# Patient Record
Sex: Male | Born: 1985
Health system: Southern US, Community
[De-identification: ages and names within clinical notes are randomized; demographics above are authoritative.]

## PROBLEM LIST (undated history)

## (undated) ENCOUNTER — Emergency Department (HOSPITAL_COMMUNITY)

## (undated) DIAGNOSIS — K219 Gastro-esophageal reflux disease without esophagitis: Secondary | ICD-10-CM

## (undated) DIAGNOSIS — F32A Depression, unspecified: Secondary | ICD-10-CM

## (undated) DIAGNOSIS — H6980 Other specified disorders of Eustachian tube, unspecified ear: Secondary | ICD-10-CM

## (undated) DIAGNOSIS — K439 Ventral hernia without obstruction or gangrene: Secondary | ICD-10-CM

## (undated) DIAGNOSIS — F909 Attention-deficit hyperactivity disorder, unspecified type: Secondary | ICD-10-CM

## (undated) DIAGNOSIS — M545 Low back pain, unspecified: Secondary | ICD-10-CM

## (undated) DIAGNOSIS — K254 Chronic or unspecified gastric ulcer with hemorrhage: Secondary | ICD-10-CM

## (undated) DIAGNOSIS — G4733 Obstructive sleep apnea (adult) (pediatric): Secondary | ICD-10-CM

## (undated) DIAGNOSIS — K589 Irritable bowel syndrome without diarrhea: Secondary | ICD-10-CM

## (undated) DIAGNOSIS — I10 Essential (primary) hypertension: Secondary | ICD-10-CM

## (undated) DIAGNOSIS — F329 Major depressive disorder, single episode, unspecified: Secondary | ICD-10-CM

## (undated) DIAGNOSIS — J42 Unspecified chronic bronchitis: Secondary | ICD-10-CM

## (undated) DIAGNOSIS — F319 Bipolar disorder, unspecified: Secondary | ICD-10-CM

## (undated) DIAGNOSIS — F419 Anxiety disorder, unspecified: Secondary | ICD-10-CM

## (undated) DIAGNOSIS — Z87442 Personal history of urinary calculi: Secondary | ICD-10-CM

## (undated) DIAGNOSIS — K760 Fatty (change of) liver, not elsewhere classified: Secondary | ICD-10-CM

## (undated) DIAGNOSIS — G8929 Other chronic pain: Secondary | ICD-10-CM

## (undated) DIAGNOSIS — G43909 Migraine, unspecified, not intractable, without status migrainosus: Secondary | ICD-10-CM

## (undated) HISTORY — DX: Bipolar disorder, unspecified: F31.9

## (undated) HISTORY — DX: Ventral hernia without obstruction or gangrene: K43.9

## (undated) HISTORY — DX: Gastro-esophageal reflux disease without esophagitis: K21.9

## (undated) HISTORY — DX: Irritable bowel syndrome, unspecified: K58.9

## (undated) HISTORY — PX: CYSTOSCOPY W/ URETEROSCOPY W/ LITHOTRIPSY: SUR380

## (undated) HISTORY — PX: FRACTURE SURGERY: SHX138

## (undated) HISTORY — PX: TYMPANOSTOMY TUBE PLACEMENT: SHX32

## (undated) HISTORY — DX: Attention-deficit hyperactivity disorder, unspecified type: F90.9

---
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
## 2006-02-26 ENCOUNTER — Emergency Department (HOSPITAL_COMMUNITY): Admission: EM | Admit: 2006-02-26 | Discharge: 2006-02-26 | Payer: Self-pay | Admitting: Family Medicine

## 2006-05-01 ENCOUNTER — Emergency Department (HOSPITAL_COMMUNITY): Admission: EM | Admit: 2006-05-01 | Discharge: 2006-05-01 | Payer: Self-pay | Admitting: Family Medicine

## 2006-07-30 ENCOUNTER — Emergency Department (HOSPITAL_COMMUNITY): Admission: EM | Admit: 2006-07-30 | Discharge: 2006-07-30 | Payer: Self-pay | Admitting: Emergency Medicine

## 2006-08-14 ENCOUNTER — Emergency Department (HOSPITAL_COMMUNITY): Admission: EM | Admit: 2006-08-14 | Discharge: 2006-08-14 | Payer: Self-pay | Admitting: Emergency Medicine

## 2006-09-17 ENCOUNTER — Emergency Department (HOSPITAL_COMMUNITY): Admission: EM | Admit: 2006-09-17 | Discharge: 2006-09-18 | Payer: Self-pay | Admitting: Emergency Medicine

## 2006-10-12 ENCOUNTER — Emergency Department (HOSPITAL_COMMUNITY): Admission: EM | Admit: 2006-10-12 | Discharge: 2006-10-12 | Payer: Self-pay | Admitting: Emergency Medicine

## 2006-11-25 ENCOUNTER — Emergency Department (HOSPITAL_COMMUNITY): Admission: EM | Admit: 2006-11-25 | Discharge: 2006-11-25 | Payer: Self-pay | Admitting: Family Medicine

## 2006-12-13 ENCOUNTER — Emergency Department (HOSPITAL_COMMUNITY): Admission: EM | Admit: 2006-12-13 | Discharge: 2006-12-13 | Payer: Self-pay | Admitting: Family Medicine

## 2006-12-15 ENCOUNTER — Emergency Department (HOSPITAL_COMMUNITY): Admission: EM | Admit: 2006-12-15 | Discharge: 2006-12-15 | Payer: Self-pay | Admitting: Family Medicine

## 2007-05-09 ENCOUNTER — Emergency Department (HOSPITAL_COMMUNITY): Admission: EM | Admit: 2007-05-09 | Discharge: 2007-05-10 | Payer: Self-pay | Admitting: Emergency Medicine

## 2007-06-01 ENCOUNTER — Emergency Department (HOSPITAL_COMMUNITY): Admission: EM | Admit: 2007-06-01 | Discharge: 2007-06-01 | Payer: Self-pay | Admitting: Emergency Medicine

## 2007-08-23 ENCOUNTER — Emergency Department (HOSPITAL_COMMUNITY): Admission: EM | Admit: 2007-08-23 | Discharge: 2007-08-23 | Payer: Self-pay | Admitting: Emergency Medicine

## 2007-09-21 ENCOUNTER — Emergency Department (HOSPITAL_BASED_OUTPATIENT_CLINIC_OR_DEPARTMENT_OTHER): Admission: EM | Admit: 2007-09-21 | Discharge: 2007-09-21 | Payer: Self-pay | Admitting: Emergency Medicine

## 2007-10-15 ENCOUNTER — Emergency Department (HOSPITAL_BASED_OUTPATIENT_CLINIC_OR_DEPARTMENT_OTHER): Admission: EM | Admit: 2007-10-15 | Discharge: 2007-10-15 | Payer: Self-pay | Admitting: Emergency Medicine

## 2007-10-27 ENCOUNTER — Emergency Department (HOSPITAL_BASED_OUTPATIENT_CLINIC_OR_DEPARTMENT_OTHER): Admission: EM | Admit: 2007-10-27 | Discharge: 2007-10-27 | Payer: Self-pay | Admitting: Emergency Medicine

## 2007-12-27 ENCOUNTER — Emergency Department (HOSPITAL_BASED_OUTPATIENT_CLINIC_OR_DEPARTMENT_OTHER): Admission: EM | Admit: 2007-12-27 | Discharge: 2007-12-27 | Payer: Self-pay | Admitting: Emergency Medicine

## 2008-03-17 ENCOUNTER — Ambulatory Visit: Payer: Self-pay | Admitting: Occupational Medicine

## 2008-03-17 DIAGNOSIS — R079 Chest pain, unspecified: Secondary | ICD-10-CM

## 2008-03-18 ENCOUNTER — Emergency Department (HOSPITAL_COMMUNITY): Admission: EM | Admit: 2008-03-18 | Discharge: 2008-03-18 | Payer: Self-pay | Admitting: Emergency Medicine

## 2008-04-04 ENCOUNTER — Emergency Department (HOSPITAL_BASED_OUTPATIENT_CLINIC_OR_DEPARTMENT_OTHER): Admission: EM | Admit: 2008-04-04 | Discharge: 2008-04-04 | Payer: Self-pay | Admitting: Emergency Medicine

## 2008-04-04 ENCOUNTER — Ambulatory Visit: Payer: Self-pay | Admitting: Radiology

## 2008-06-04 ENCOUNTER — Ambulatory Visit: Payer: Self-pay | Admitting: Diagnostic Radiology

## 2008-06-04 ENCOUNTER — Emergency Department (HOSPITAL_BASED_OUTPATIENT_CLINIC_OR_DEPARTMENT_OTHER): Admission: EM | Admit: 2008-06-04 | Discharge: 2008-06-04 | Payer: Self-pay | Admitting: Emergency Medicine

## 2008-08-01 ENCOUNTER — Ambulatory Visit: Payer: Self-pay | Admitting: Internal Medicine

## 2008-09-29 ENCOUNTER — Emergency Department (HOSPITAL_BASED_OUTPATIENT_CLINIC_OR_DEPARTMENT_OTHER): Admission: EM | Admit: 2008-09-29 | Discharge: 2008-09-29 | Payer: Self-pay | Admitting: Emergency Medicine

## 2008-09-29 ENCOUNTER — Ambulatory Visit: Payer: Self-pay | Admitting: Diagnostic Radiology

## 2008-11-04 ENCOUNTER — Ambulatory Visit: Payer: Self-pay | Admitting: Diagnostic Radiology

## 2008-11-04 ENCOUNTER — Emergency Department (HOSPITAL_BASED_OUTPATIENT_CLINIC_OR_DEPARTMENT_OTHER): Admission: EM | Admit: 2008-11-04 | Discharge: 2008-11-04 | Payer: Self-pay | Admitting: Emergency Medicine

## 2008-11-11 ENCOUNTER — Emergency Department (HOSPITAL_BASED_OUTPATIENT_CLINIC_OR_DEPARTMENT_OTHER): Admission: EM | Admit: 2008-11-11 | Discharge: 2008-11-11 | Payer: Self-pay | Admitting: Emergency Medicine

## 2008-12-16 ENCOUNTER — Emergency Department (HOSPITAL_BASED_OUTPATIENT_CLINIC_OR_DEPARTMENT_OTHER): Admission: EM | Admit: 2008-12-16 | Discharge: 2008-12-16 | Payer: Self-pay | Admitting: Emergency Medicine

## 2008-12-16 ENCOUNTER — Ambulatory Visit: Payer: Self-pay | Admitting: Diagnostic Radiology

## 2009-01-03 ENCOUNTER — Ambulatory Visit: Payer: Self-pay | Admitting: Radiology

## 2009-01-03 ENCOUNTER — Emergency Department (HOSPITAL_BASED_OUTPATIENT_CLINIC_OR_DEPARTMENT_OTHER): Admission: EM | Admit: 2009-01-03 | Discharge: 2009-01-03 | Payer: Self-pay | Admitting: Emergency Medicine

## 2009-01-17 HISTORY — PX: ESOPHAGOGASTRODUODENOSCOPY: SHX1529

## 2009-01-22 ENCOUNTER — Emergency Department (HOSPITAL_BASED_OUTPATIENT_CLINIC_OR_DEPARTMENT_OTHER): Admission: EM | Admit: 2009-01-22 | Discharge: 2009-01-22 | Payer: Self-pay | Admitting: Emergency Medicine

## 2009-04-06 ENCOUNTER — Encounter: Admission: RE | Admit: 2009-04-06 | Discharge: 2009-04-06 | Payer: Self-pay | Admitting: Nurse Practitioner

## 2009-04-11 ENCOUNTER — Ambulatory Visit: Payer: Self-pay | Admitting: Diagnostic Radiology

## 2009-04-11 ENCOUNTER — Emergency Department (HOSPITAL_BASED_OUTPATIENT_CLINIC_OR_DEPARTMENT_OTHER): Admission: EM | Admit: 2009-04-11 | Discharge: 2009-04-11 | Payer: Self-pay | Admitting: Emergency Medicine

## 2009-04-19 ENCOUNTER — Encounter: Admission: RE | Admit: 2009-04-19 | Discharge: 2009-04-19 | Payer: Self-pay | Admitting: Orthopedic Surgery

## 2009-04-28 ENCOUNTER — Encounter: Admission: RE | Admit: 2009-04-28 | Discharge: 2009-07-27 | Payer: Self-pay | Admitting: Orthopedic Surgery

## 2009-06-12 ENCOUNTER — Other Ambulatory Visit: Payer: Self-pay | Admitting: Emergency Medicine

## 2009-06-13 ENCOUNTER — Ambulatory Visit: Payer: Self-pay | Admitting: Psychiatry

## 2009-06-13 ENCOUNTER — Inpatient Hospital Stay (HOSPITAL_COMMUNITY): Admission: RE | Admit: 2009-06-13 | Discharge: 2009-06-15 | Payer: Self-pay | Admitting: Psychiatry

## 2009-09-15 ENCOUNTER — Emergency Department (HOSPITAL_BASED_OUTPATIENT_CLINIC_OR_DEPARTMENT_OTHER): Admission: EM | Admit: 2009-09-15 | Discharge: 2009-09-15 | Payer: Self-pay | Admitting: Emergency Medicine

## 2009-09-15 ENCOUNTER — Ambulatory Visit: Payer: Self-pay | Admitting: Diagnostic Radiology

## 2009-10-07 ENCOUNTER — Emergency Department (HOSPITAL_BASED_OUTPATIENT_CLINIC_OR_DEPARTMENT_OTHER): Admission: EM | Admit: 2009-10-07 | Discharge: 2009-10-07 | Payer: Self-pay | Admitting: Emergency Medicine

## 2009-10-14 ENCOUNTER — Emergency Department (HOSPITAL_BASED_OUTPATIENT_CLINIC_OR_DEPARTMENT_OTHER): Admission: EM | Admit: 2009-10-14 | Discharge: 2009-10-14 | Payer: Self-pay | Admitting: Emergency Medicine

## 2009-10-14 ENCOUNTER — Ambulatory Visit: Payer: Self-pay | Admitting: Radiology

## 2009-10-26 ENCOUNTER — Ambulatory Visit: Payer: Self-pay | Admitting: Diagnostic Radiology

## 2009-10-26 ENCOUNTER — Emergency Department (HOSPITAL_BASED_OUTPATIENT_CLINIC_OR_DEPARTMENT_OTHER): Admission: EM | Admit: 2009-10-26 | Discharge: 2009-10-26 | Payer: Self-pay | Admitting: Emergency Medicine

## 2009-11-29 ENCOUNTER — Emergency Department (HOSPITAL_BASED_OUTPATIENT_CLINIC_OR_DEPARTMENT_OTHER): Admission: EM | Admit: 2009-11-29 | Discharge: 2009-11-29 | Payer: Self-pay | Admitting: Emergency Medicine

## 2009-11-29 ENCOUNTER — Ambulatory Visit: Payer: Self-pay | Admitting: Diagnostic Radiology

## 2009-12-08 ENCOUNTER — Ambulatory Visit: Payer: Self-pay | Admitting: Gastroenterology

## 2009-12-08 DIAGNOSIS — K7689 Other specified diseases of liver: Secondary | ICD-10-CM | POA: Insufficient documentation

## 2009-12-08 DIAGNOSIS — R197 Diarrhea, unspecified: Secondary | ICD-10-CM

## 2009-12-08 DIAGNOSIS — R109 Unspecified abdominal pain: Secondary | ICD-10-CM | POA: Insufficient documentation

## 2009-12-08 DIAGNOSIS — K219 Gastro-esophageal reflux disease without esophagitis: Secondary | ICD-10-CM | POA: Insufficient documentation

## 2009-12-08 DIAGNOSIS — F319 Bipolar disorder, unspecified: Secondary | ICD-10-CM

## 2009-12-14 ENCOUNTER — Encounter: Payer: Self-pay | Admitting: Gastroenterology

## 2009-12-22 ENCOUNTER — Telehealth (INDEPENDENT_AMBULATORY_CARE_PROVIDER_SITE_OTHER): Payer: Self-pay

## 2009-12-23 ENCOUNTER — Ambulatory Visit (HOSPITAL_COMMUNITY)
Admission: RE | Admit: 2009-12-23 | Discharge: 2009-12-23 | Payer: Self-pay | Source: Home / Self Care | Attending: Gastroenterology | Admitting: Gastroenterology

## 2009-12-24 ENCOUNTER — Telehealth (INDEPENDENT_AMBULATORY_CARE_PROVIDER_SITE_OTHER): Payer: Self-pay

## 2009-12-24 ENCOUNTER — Emergency Department (HOSPITAL_COMMUNITY)
Admission: EM | Admit: 2009-12-24 | Discharge: 2009-12-25 | Disposition: A | Discharge status: Other Acute Inpatient Hospital | Payer: Self-pay | Source: Home / Self Care | Attending: Emergency Medicine | Admitting: Emergency Medicine

## 2009-12-25 ENCOUNTER — Inpatient Hospital Stay (HOSPITAL_COMMUNITY)
Admission: RE | Admit: 2009-12-25 | Discharge: 2009-12-29 | Payer: Self-pay | Source: Home / Self Care | Attending: Psychiatry | Admitting: Psychiatry

## 2010-02-07 ENCOUNTER — Encounter: Payer: Self-pay | Admitting: Emergency Medicine

## 2010-02-11 ENCOUNTER — Encounter (INDEPENDENT_AMBULATORY_CARE_PROVIDER_SITE_OTHER): Payer: Self-pay | Admitting: *Deleted

## 2010-02-18 NOTE — Assessment & Plan Note (Signed)
Summary: abd pain,acid reflux/fatty liver/ss   Visit Type:  Initial Consult Referring Saafir Abdullah:  Wylene Men, NP  Primary Care Delailah Spieth:  Dr Elbert Ewings. [place]  Chief Complaint:  abd pain.  History of Present Illness: Here for evaluation abd pain & acid reflux.  c/o abd pain x 2 months and points to entire abd.  "Can't sleep at night", feels like "being beat w/ a hammer"  radiates around abd, radiates to right side.  9/10, intermittant, last 2-3 hrs.  Starts early in AM 2 hrs after breakfast.   Vomited on several occasions w/ the pain.  Omeprazole 20mg  daily x 1 month--no relief.  Does have heartburn as well.  Denies dysphagia or odynophagia.   Appetite ok, weight stable.  BM every 2-3 hrs 10-15 times per day loose watery brown 2-3 times per week since a child.  Denies mucous or blood in stools.  Denies melena.  Denies fever or chills.  Had ABD US-.hepatomegaly,fatty liver w/ mildly elevated liver tests.    Last LFTs->10/20/09->AST 52, ALT 74, otherwise normal Acute Hepatitis panel negative Met 7 normal CBC EOS 6%, otherwise normal TSH normal   Current Problems (verified): 1)  Diarrhea, Chronic  (ICD-787.91) 2)  Bipolar Affective Disorder  (ICD-296.80) 3)  Abdominal Pain  (ICD-789.00) 4)  Gerd  (ICD-530.81) 5)  Fatty Liver Disease  (ICD-571.8) 6)  Chest Pain Unspecified  (ICD-786.50)  Current Medications (verified): 1)  Abilify 10 Mg Tabs (Aripiprazole) .... Once Daily 2)  Dexilant 60 Mg Cpdr (Dexlansoprazole) .Marland Kitchen.. 1 By Mouth Daily For Acid Reflux 3)  Levsin/sl 0.125 Mg Subl (Hyoscyamine Sulfate) .Marland Kitchen.. 1 By Mouth Ac/hs (Up To Qid) As Needed Diarrhea/abd Pain  Allergies (verified): 1)  ! Ativan 2)  ! Toradol  Past History:  Past Surgical History: Last updated: 03/17/2008 ear tubes  Past Medical History: ADHD BIPOLAR AFFECTIVE DISORDER (ICD-296.80) GERD (ICD-530.81) FATTY LIVER DISEASE (ICD-571.8)  Family History: No known family history of colorectal carcinoma, IBD, liver or  chronic GI problems. Father: (77) healthy Mother: (9) healthy Siblings: healthy 1 sister deceased-drowning  Social History: lives w/ girlfriend & her parents, 2 children-healthy smokes 1 pack per day for 9 yrs, 1/2ppd now Alcohol Use - no Illicit Drug Use - no Patient gets regular exercise, walks/runs daily Drug Use:  no Does Patient Exercise:  yes  Review of Systems General:  Complains of fatigue, weakness, malaise, and sleep disorder; denies fever, chills, sweats, anorexia, and weight loss. CV:  Denies chest pains, angina, palpitations, syncope, dyspnea on exertion, orthopnea, PND, peripheral edema, and claudication. Resp:  Denies dyspnea at rest, dyspnea with exercise, cough, sputum, wheezing, coughing up blood, and pleurisy. GI:  Denies jaundice and fecal incontinence. GU:  Denies urinary burning, blood in urine, urinary frequency, urinary hesitancy, nocturnal urination, and urinary incontinence. MS:  Complains of low back pain; denies joint pain / LOM, joint swelling, joint stiffness, joint deformity, muscle weakness, muscle cramps, muscle atrophy, leg pain at night, leg pain with exertion, and shoulder pain / LOM hand / wrist pain (CTS). Derm:  Denies rash, itching, dry skin, hives, moles, warts, and unhealing ulcers. Psych:  Complains of depression, anxiety, memory loss, hallucinations, and confusion; denies suicidal ideation, paranoia, and phobia; sees NP Nooe for mentl health. Heme:  Denies bruising, bleeding, and enlarged lymph nodes.  Vital Signs:  Patient profile:   25 year old male Height:      70 inches Weight:      302 pounds BMI:     43.49 Temp:  98.7 degrees F oral Pulse rate:   88 / minute BP sitting:   132 / 90  (left arm) Cuff size:   large  Vitals Entered By: Hendricks Limes LPN (December 08, 2009 11:09 AM)  Physical Exam  General:  Well developed, well nourished, no acute distress. Head:  Normocephalic and atraumatic. Eyes:  Sclera clear, no  icterus. Ears:  Normal auditory acuity. Nose:  No deformity, discharge,  or lesions. Mouth:  No deformity or lesions, dentition normal. Neck:  Supple; no masses or thyromegaly. Lungs:  Clear throughout to auscultation. Heart:  Regular rate and rhythm; no murmurs, rubs,  or bruits. Abdomen:  normal bowel sounds, obese, without guarding, without rebound, no hernia, no distesion, no masses, and no hepatomegally or splenomegaly. Mild generalized tenderness to touch.  Msk:  Symmetrical with no gross deformities. Normal posture. Pulses:  Normal pulses noted. Extremities:  No clubbing, cyanosis, edema or deformities noted. Neurologic:  Alert and  oriented x4;  grossly normal neurologically. Skin:  Intact without significant lesions or rashes. Cervical Nodes:  No significant cervical adenopathy. Psych:  Alert and cooperative. Normal mood and affect.  Impression & Recommendations:  Problem # 1:  GERD (ICD-530.81) 25 y/o obese caucasian male w/ 1 month hx generalized abd pain, refractory GERD after 1 month trial daily omeprazole, mild peripheral eosinophilia, chronic intermittant diarrhea, & fatty liver.  I suspect he may have complicated GERD, gastritis/esophagitis, or eosinophilic esophagitis.  Cannot r/o biliary dyskinesia at this point either.  As far as his lifelong intermittant diarrhea, I suspect he has IBS-D as the culprit.  He has no warning signs including rectal bleeding, wt loss, & his diarrhea is not on a daily basis, so he will undergo trial of antispasmotics prior to further evaluation such as celiac panel & colonoscopy.  EGD to be performed by Dr. Jonette Eva in the near future.  I have discussed risks and benefits which include, but are not limited to, bleeding, infection, perforation, or medication reaction.  The patient agrees with this plan and consent will be obtained.  Orders: Consultation Level IV (16109)  Problem # 2:  ABDOMINAL PAIN (ICD-789.00) See #1  Problem # 3:   DIARRHEA, CHRONIC (ICD-787.91) See #1  Problem # 4:  FATTY LIVER DISEASE (ICD-571.8) Stable.  Mild transaminitis.  Suggest wt loss 1-2# per week, low-fat, low chol diet, incr  Patient Instructions: 1)  Trial dexilant & levsin 2)  Suggest wt loss 1-2# per week, low-fat, low chol diet 3)  Increase daily exercise to 30 mins MOST days of week Prescriptions: LEVSIN/SL 0.125 MG SUBL (HYOSCYAMINE SULFATE) 1 by mouth ac/hs (up to QID) as needed diarrhea/abd pain  #60 x 1   Entered and Authorized by:   Joselyn Arrow FNP-BC   Signed by:   Joselyn Arrow FNP-BC on 12/08/2009   Method used:   Print then Give to Patient   RxID:   [id number] DEXILANT 60 MG CPDR (DEXLANSOPRAZOLE) 1 by mouth daily for acid reflux  #31 x 2   Entered and Authorized by:   Joselyn Arrow FNP-BC   Signed by:   Joselyn Arrow FNP-BC on 12/08/2009   Method used:   Print then Give to Patient   RxID:   [id number]   Appended Document: abd pain,acid reflux/fatty liver/ss Needs EGD w/ gastric and duodenal biopsies. Pt needs HO on managing reflux through lifestyles modification: low fat diet, lose weight, stop smoking.  Appended Document: abd pain,acid reflux/fatty liver/ss For diarrhea needs a  workup to include CDIFF PCR, fecal lactoferrin, and Giardia Ag.  Appended Document: abd pain,acid reflux/fatty liver/ss Pt informed. Lab order and bottles at front for pick-up along with reflux handout and low fat diet.  Appended Document: abd pain,acid reflux/fatty liver/ss Nexium preferred PPI. Please let pt know trial Nexium 40mg  daily for acid reflux, #31, 5 RF  Appended Document: abd pain,acid reflux/fatty liver/ss Pt informed of the above. Rx called to [facility] [place].  Appended Document: abd pain,acid reflux/fatty liver/ss Pt never picked up bottles etc to do stool specimens.  Appended Document: abd pain,acid reflux/fatty liver/ss noted

## 2010-02-18 NOTE — Progress Notes (Addendum)
Summary: diarrhea  Phone Note Call from Patient Call back at Home Phone [phone]   Caller: Patient Summary of Call: pt called- KJ told him to stop taking the levsin and pt has had watery diarrhea x 2 days. pt wants to know if we can call in something for it to [facility] please advise Initial call taken by: Hendricks Limes LPN,  December 24, 2009 11:55 AM     Appended Document: diarrhea Please call pt. Please restart Levsin 1-2 sl three times a day  and at bedtime. Avoid dairy products. No sodas.  Appended Document: diarrhea pt aware  Appended Document: diarrhea OPV IN 2-4 WEEKS WITH KJ RE: DIARRHEA. If Sx persist, add Imipramine 10 mg at bedtime fo 3 days the 20 mg at bedtime for 3 days then 30 mg at bedtime. side effects: dry mouth, drowsiness, constipation.  Appended Document: diarrhea unable to reach pt by phone. LMOM but not sure if it's the correct number. I will mail letter today  Appended Document: diarrhea pt called and is aware of his appt for 2/6/ @ 130 w/KJ

## 2010-02-18 NOTE — Miscellaneous (Signed)
Summary: Orders Update  Clinical Lists Changes  Orders: Added new Test order of T-Fecal WBC [id number]) - Signed Added new Test order of T-Stool Giardia / Crypto- EIA (09811) - Signed Added new Test order of T-C diff by PCR (91478) - Signed

## 2010-02-18 NOTE — Letter (Signed)
Summary: Recall Office Visit  [facility]  [address]   [place], [place] [address]   Phone: [phone]  Fax: [phone]      February 11, 2010   Marc Mayo [address] [place], [place]  [address] 04/10/1985   Dear [name],   According to our records, it is time for you to schedule a follow-up office visit with [place].   At your convenience, please call [phone] to schedule an office visit. If you have any questions, concerns, or feel that this letter is in error, we would appreciate your call.   Sincerely,    Diana Eves  [facility] Ph: [phone]   Fax: [phone]

## 2010-02-18 NOTE — Progress Notes (Signed)
Summary: abd pain  Phone Note Call from Patient Call back at [phone]   Caller: Patient Summary of Call: pt called-he is scheduled for egd in the AM. He is having abd pain, N/V, no fever, no diarrhea/constipation. Pt did not do stool studies yet because he has been too busy and Levsin KJ gave him doesnt seem to be helping. pt stated he has not slept in 3 days and is requesting something called in to [facility] pharmacy for pain. please advise Initial call taken by: Hendricks Limes LPN,  December 22, 2009 3:38 PM     Appended Document: abd pain To ER if he is having severe pain, otherwise Hold Levsin Keep EGD as planned for morning  Appended Document: abd pain pt aware  Appended Document: diarrhea Please call pt. Please restart Levsin 1-2 sl three times a day  and at bedtime. Avoid dairy products. No sodas.  Appended Document: diarrhea pt aware

## 2010-02-18 NOTE — Letter (Signed)
Summary: EGD ORDER  EGD ORDER   Imported By: Ave Filter 12/08/2009 11:51:17  _____________________________________________________________________  External Attachment:    Type:   Image     Comment:   External Document

## 2010-02-22 ENCOUNTER — Encounter: Payer: Self-pay | Admitting: Urgent Care

## 2010-02-22 ENCOUNTER — Ambulatory Visit (INDEPENDENT_AMBULATORY_CARE_PROVIDER_SITE_OTHER): Payer: Medicare Other | Admitting: Urgent Care

## 2010-02-22 DIAGNOSIS — K589 Irritable bowel syndrome without diarrhea: Secondary | ICD-10-CM

## 2010-03-04 NOTE — Assessment & Plan Note (Signed)
Summary: DIARRHEA   Vital Signs:  Patient profile:   25 year old male Height:      70 inches Weight:      316 pounds BMI:     45.51 Temp:     98.2 degrees F oral Pulse rate:   100 / minute BP sitting:   138 / 92  (left arm) Cuff size:   large  Vitals Entered By: Hendricks Limes LPN (February 22, 2010 1:42 PM)  Visit Type:  Follow-up Visit Primary Care Provider:  Dr Lysbeth Galas  Chief Complaint:  diarrhea and follow up.  History of Present Illness: Here for FU IBS and GERD.  On Nexium 40mg  daily w/ good control.  No BM in 5 days.  c/o blq abd pain  constant, better w/ defecation.  Denies fever or chills.  Hx lifelong constipation, but alters w/ diarrhea.  c/o migraines.  IBU 800mg  2-3 per days 2-3 days/week.  N/V last week x 8-9.  Denies rectal bleeding or melena.  Recent EGD & colonscopy by Dr Darrick Penna  chronic non-h pylori gastritis, benign SB bx.  Current Problems (verified): 1)  Diarrhea, Chronic  (ICD-787.91) 2)  Bipolar Affective Disorder  (ICD-296.80) 3)  Abdominal Pain  (ICD-789.00) 4)  Gerd  (ICD-530.81) 5)  Fatty Liver Disease  (ICD-571.8) 6)  Chest Pain Unspecified  (ICD-786.50)  Current Medications (verified): 1)  Nexium 40 Mg Cpdr (Esomeprazole Magnesium) .... Once Daily 2)  Imipramine Hcl 10 Mg Tabs (Imipramine Hcl) .Marland Kitchen.. 1po Daily X 3 Days, Then 2 By Mouth Daily X 3 Days, Then 3 By Mouth Daily 3)  Miralax  Powd (Polyethylene Glycol 3350) .Marland KitchenMarland KitchenMarland Kitchen 17 Grams By Mouth Daily in 8 Oz Drink For Constipation (Hold If Diarrhea)  Allergies: 1)  ! Ativan 2)  ! Toradol 3)  ! * Nerve Pills  Past History:  Past Surgical History: Last updated: 03/17/2008 ear tubes  Past Medical History: ADHD BIPOLAR AFFECTIVE DISORDER (ICD-296.80) GERD (ICD-530.81) FATTY LIVER DISEASE (ICD-571.8) IBS  Review of Systems      See HPI General:  Denies fever, chills, sweats, anorexia, fatigue, weakness, malaise, weight loss, and sleep disorder. CV:  Denies chest pains, angina, palpitations,  syncope, dyspnea on exertion, orthopnea, PND, peripheral edema, and claudication. Resp:  Denies dyspnea at rest, dyspnea with exercise, cough, sputum, wheezing, coughing up blood, and pleurisy. GI:  See HPI; Denies difficulty swallowing, pain on swallowing, jaundice, and fecal incontinence. GU:  Denies urinary burning, blood in urine, urinary frequency, urinary hesitancy, nocturnal urination, and urinary incontinence. MS:  Denies joint pain / LOM, joint swelling, joint stiffness, joint deformity, low back pain, muscle weakness, muscle cramps, muscle atrophy, leg pain at night, leg pain with exertion, and shoulder pain / LOM hand / wrist pain (CTS). Derm:  Denies rash, itching, dry skin, hives, moles, warts, and unhealing ulcers. Neuro:  Complains of headache; denies weakness, paralysis, abnormal sensation, seizures, syncope, tremors, vertigo, transient blindness, frequent falls, frequent headaches, difficulty walking, sciatica, radiculopathy other:, restless legs, memory loss, and confusion; hx migraines. Psych:  Denies depression, anxiety, memory loss, suicidal ideation, hallucinations, paranoia, phobia, and confusion. Heme:  Denies bruising, bleeding, and enlarged lymph nodes.  Physical Exam  General:  Well developed, well nourished, no acute distress. Head:  Normocephalic and atraumatic. Eyes:  Sclera clear, no icterus. Mouth:  No deformity or lesions, dentition normal. Neck:  Supple; no masses or thyromegaly. Heart:  Regular rate and rhythm; no murmurs, rubs,  or bruits. Abdomen:  normal bowel sounds, obese, without guarding,  without rebound, no hernia, no distesion, no masses, and no hepatomegally or splenomegaly. Mild generalized tenderness to touch.  Msk:  Symmetrical with no gross deformities. Normal posture. Pulses:  Normal pulses noted. Extremities:  No clubbing, cyanosis, edema or deformities noted. Neurologic:  Alert and  oriented x4;  grossly normal neurologically. Skin:  Intact  without significant lesions or rashes. Cervical Nodes:  No significant cervical adenopathy. Psych:  Alert and cooperative. Normal mood and affect.   Impression & Recommendations:  Problem # 1:  GERD (ICD-530.81) Well controlled on Nexium  Problem # 2:  IBS (ICD-564.1) IBS-mixed.  C/o constipation at present.  Hx migraines & functional abd pain.  Trial imipramine & miralax.  He has Levsin for when he gets diarrhea.  Orders: Est. Patient Level III (16109)  Patient Instructions: 1)  IBS brochure given.  2)  OV 6 weeks Prescriptions: MIRALAX  POWD (POLYETHYLENE GLYCOL 3350) 17 grams by mouth daily in 8 oz drink for constipation (Hold if diarrhea)  #527 grams x 5   Entered and Authorized by:   Joselyn Arrow FNP-BC   Signed by:   Joselyn Arrow FNP-BC on 02/22/2010   Method used:   Print then Give to Patient   RxID:   [id number] IMIPRAMINE HCL 10 MG TABS (IMIPRAMINE HCL) 1po daily x 3 days, then 2 by mouth daily x 3 days, then 3 by mouth daily  #90 x 1   Entered and Authorized by:   Joselyn Arrow FNP-BC   Signed by:   Joselyn Arrow FNP-BC on 02/22/2010   Method used:   Print then Give to Patient   RxID:   [id number]    Orders Added: 1)  Est. Patient Level III [29528]  Appended Document: DIARRHEA escalated dosing of imipramine may be have much to do with constipation; may need to back off if clinically indicated.  Appended Document: DIARRHEA 6 WK F/U OPV IS IN THE COMPUTER

## 2010-03-30 LAB — URINALYSIS, ROUTINE W REFLEX MICROSCOPIC
Ketones, ur: NEGATIVE mg/dL
Nitrite: NEGATIVE
Protein, ur: NEGATIVE mg/dL

## 2010-03-30 LAB — CBC
HCT: 44.4 % (ref 39.0–52.0)
Hemoglobin: 16.2 g/dL (ref 13.0–17.0)
MCV: 87.4 fL (ref 78.0–100.0)
Platelets: 243 10*3/uL (ref 150–400)
RBC: 5.08 MIL/uL (ref 4.22–5.81)
WBC: 12.7 10*3/uL — ABNORMAL HIGH (ref 4.0–10.5)

## 2010-03-30 LAB — DIFFERENTIAL
Eosinophils Relative: 6 % — ABNORMAL HIGH (ref 0–5)
Lymphocytes Relative: 30 % (ref 12–46)
Lymphs Abs: 3.8 10*3/uL (ref 0.7–4.0)
Monocytes Absolute: 0.9 10*3/uL (ref 0.1–1.0)

## 2010-03-30 LAB — BASIC METABOLIC PANEL
Chloride: 107 mEq/L (ref 96–112)
GFR calc Af Amer: >60 mL/min (ref >60)
Potassium: 3.5 mEq/L (ref 3.5–5.1)
Sodium: 141 mEq/L (ref 135–145)

## 2010-03-30 LAB — ETHANOL: Alcohol, Ethyl (B): <5 mg/dL (ref 0–10)

## 2010-04-01 LAB — DIFFERENTIAL
Eosinophils Relative: 3 % (ref 0–5)
Lymphocytes Relative: 29 % (ref 12–46)
Lymphs Abs: 3.3 10*3/uL (ref 0.7–4.0)
Monocytes Absolute: 0.5 10*3/uL (ref 0.1–1.0)
Monocytes Relative: 5 % (ref 3–12)

## 2010-04-01 LAB — URINALYSIS, ROUTINE W REFLEX MICROSCOPIC
Bilirubin Urine: NEGATIVE
Glucose, UA: NEGATIVE mg/dL
Glucose, UA: NEGATIVE mg/dL
Ketones, ur: NEGATIVE mg/dL
Ketones, ur: NEGATIVE mg/dL
pH: 6 (ref 5.0–8.0)
pH: 7.5 (ref 5.0–8.0)

## 2010-04-01 LAB — CBC
HCT: 44.7 % (ref 39.0–52.0)
Hemoglobin: 15.4 g/dL (ref 13.0–17.0)
MCV: 89.7 fL (ref 78.0–100.0)
RBC: 4.98 MIL/uL (ref 4.22–5.81)
WBC: 11.2 10*3/uL — ABNORMAL HIGH (ref 4.0–10.5)

## 2010-04-01 LAB — BASIC METABOLIC PANEL
BUN: 10 mg/dL (ref 6–23)
Chloride: 109 mEq/L (ref 96–112)
GFR calc Af Amer: >60 mL/min (ref >60)
Potassium: 4.7 mEq/L (ref 3.5–5.1)

## 2010-04-05 LAB — POCT I-STAT, CHEM 8
Calcium, Ion: 1.19 mmol/L (ref 1.12–1.32)
Chloride: 107 mEq/L (ref 96–112)
Glucose, Bld: 102 mg/dL — ABNORMAL HIGH (ref 70–99)
HCT: 48 % (ref 39.0–52.0)
Hemoglobin: 16.3 g/dL (ref 13.0–17.0)

## 2010-04-05 LAB — CBC
Hemoglobin: 15.2 g/dL (ref 13.0–17.0)
MCHC: 33.8 g/dL (ref 30.0–36.0)
MCV: 92.4 fL (ref 78.0–100.0)
RDW: 14 % (ref 11.5–15.5)

## 2010-04-05 LAB — RAPID URINE DRUG SCREEN, HOSP PERFORMED
Amphetamines: NOT DETECTED
Barbiturates: NOT DETECTED
Cocaine: NOT DETECTED
Opiates: NOT DETECTED
Tetrahydrocannabinol: NOT DETECTED

## 2010-04-05 LAB — DIFFERENTIAL
Basophils Absolute: 0.1 10*3/uL (ref 0.0–0.1)
Basophils Relative: 1 % (ref 0–1)
Eosinophils Absolute: 1 10*3/uL — ABNORMAL HIGH (ref 0.0–0.7)
Monocytes Absolute: 0.5 10*3/uL (ref 0.1–1.0)
Neutro Abs: 6.7 10*3/uL (ref 1.7–7.7)
Neutrophils Relative %: 54 % (ref 43–77)

## 2010-04-05 LAB — TSH: TSH: 1.787 u[IU]/mL (ref 0.350–4.500)

## 2010-04-12 ENCOUNTER — Emergency Department (HOSPITAL_BASED_OUTPATIENT_CLINIC_OR_DEPARTMENT_OTHER)
Admission: EM | Admit: 2010-04-12 | Discharge: 2010-04-12 | Disposition: A | Discharge status: Home / Self Care | Payer: Medicare Other | Attending: Emergency Medicine | Admitting: Emergency Medicine

## 2010-04-12 ENCOUNTER — Emergency Department (INDEPENDENT_AMBULATORY_CARE_PROVIDER_SITE_OTHER): Payer: Medicare Other

## 2010-04-12 DIAGNOSIS — M940 Chondrocostal junction syndrome [Tietze]: Secondary | ICD-10-CM | POA: Insufficient documentation

## 2010-04-12 DIAGNOSIS — R079 Chest pain, unspecified: Secondary | ICD-10-CM | POA: Insufficient documentation

## 2010-04-12 DIAGNOSIS — R0602 Shortness of breath: Secondary | ICD-10-CM

## 2010-04-12 DIAGNOSIS — F319 Bipolar disorder, unspecified: Secondary | ICD-10-CM | POA: Insufficient documentation

## 2010-04-12 DIAGNOSIS — I10 Essential (primary) hypertension: Secondary | ICD-10-CM | POA: Insufficient documentation

## 2010-04-12 DIAGNOSIS — E669 Obesity, unspecified: Secondary | ICD-10-CM | POA: Insufficient documentation

## 2010-04-19 ENCOUNTER — Telehealth: Payer: Self-pay

## 2010-04-19 LAB — POCT CARDIAC MARKERS
Myoglobin, poc: 56.3 ng/mL (ref 12–200)
Troponin i, poc: <0.05 ng/mL (ref 0.00–0.09)
Troponin i, poc: <0.05 ng/mL (ref 0.00–0.09)

## 2010-04-19 LAB — DIFFERENTIAL
Basophils Absolute: 0.3 10*3/uL — ABNORMAL HIGH (ref 0.0–0.1)
Eosinophils Relative: 6 % — ABNORMAL HIGH (ref 0–5)
Lymphocytes Relative: 30 % (ref 12–46)
Lymphs Abs: 3.8 10*3/uL (ref 0.7–4.0)
Neutro Abs: 6.9 10*3/uL (ref 1.7–7.7)
Neutrophils Relative %: 55 % (ref 43–77)

## 2010-04-19 LAB — BASIC METABOLIC PANEL
BUN: 7 mg/dL (ref 6–23)
Calcium: 9.2 mg/dL (ref 8.4–10.5)
Creatinine, Ser: 0.8 mg/dL (ref 0.4–1.5)
GFR calc non Af Amer: >60 mL/min (ref >60)
Glucose, Bld: 91 mg/dL (ref 70–99)
Potassium: 3.9 mEq/L (ref 3.5–5.1)

## 2010-04-19 LAB — CBC
Platelets: 238 10*3/uL (ref 150–400)
RDW: 13 % (ref 11.5–15.5)
WBC: 12.6 10*3/uL — ABNORMAL HIGH (ref 4.0–10.5)

## 2010-04-19 NOTE — Telephone Encounter (Signed)
Pt requesting refills of Imipramine hcl 10 mg 3qd  To [facility] pharmacy (fax [phone], phone [phone])

## 2010-04-20 ENCOUNTER — Other Ambulatory Visit: Payer: Self-pay | Admitting: Family Medicine

## 2010-04-20 ENCOUNTER — Ambulatory Visit
Admission: RE | Admit: 2010-04-20 | Discharge: 2010-04-20 | Disposition: A | Discharge status: Home / Self Care | Payer: Medicare Other | Source: Ambulatory Visit | Attending: Family Medicine | Admitting: Family Medicine

## 2010-04-20 DIAGNOSIS — M5430 Sciatica, unspecified side: Secondary | ICD-10-CM

## 2010-04-20 DIAGNOSIS — M545 Low back pain: Secondary | ICD-10-CM

## 2010-04-20 MED ORDER — IMIPRAMINE HCL 10 MG PO TABS: 30.0000 mg | ORAL_TABLET | Freq: Every day | ORAL | Status: DC

## 2010-04-21 ENCOUNTER — Other Ambulatory Visit: Payer: Medicare Other

## 2010-04-21 LAB — URINALYSIS, ROUTINE W REFLEX MICROSCOPIC
Bilirubin Urine: NEGATIVE
Glucose, UA: NEGATIVE mg/dL
Ketones, ur: NEGATIVE mg/dL
pH: 6 (ref 5.0–8.0)

## 2010-04-21 LAB — DIFFERENTIAL
Eosinophils Absolute: 0.6 10*3/uL (ref 0.0–0.7)
Eosinophils Relative: 6 % — ABNORMAL HIGH (ref 0–5)
Lymphs Abs: 2.4 10*3/uL (ref 0.7–4.0)
Monocytes Relative: 4 % (ref 3–12)

## 2010-04-21 LAB — BASIC METABOLIC PANEL
BUN: 6 mg/dL (ref 6–23)
CO2: 25 mEq/L (ref 19–32)
Chloride: 106 mEq/L (ref 96–112)
Potassium: 3.9 mEq/L (ref 3.5–5.1)

## 2010-04-21 LAB — CBC
HCT: 46.4 % (ref 39.0–52.0)
MCHC: 33.9 g/dL (ref 30.0–36.0)
MCV: 91.3 fL (ref 78.0–100.0)
Platelets: 254 10*3/uL (ref 150–400)
WBC: 11.5 10*3/uL — ABNORMAL HIGH (ref 4.0–10.5)

## 2010-04-21 LAB — POCT TOXICOLOGY PANEL

## 2010-04-21 LAB — POCT CARDIAC MARKERS
CKMB, poc: <1 ng/mL — ABNORMAL LOW (ref 1.0–8.0)
Troponin i, poc: <0.05 ng/mL (ref 0.00–0.09)

## 2010-04-22 LAB — POCT CARDIAC MARKERS
CKMB, poc: 1.3 ng/mL (ref 1.0–8.0)
Myoglobin, poc: 72.7 ng/mL (ref 12–200)
Troponin i, poc: <0.05 ng/mL (ref 0.00–0.09)

## 2010-04-29 LAB — BASIC METABOLIC PANEL
Calcium: 9.1 mg/dL (ref 8.4–10.5)
GFR calc Af Amer: >60 mL/min (ref >60)
GFR calc non Af Amer: >60 mL/min (ref >60)
Glucose, Bld: 79 mg/dL (ref 70–99)
Sodium: 141 mEq/L (ref 135–145)

## 2010-04-29 LAB — CBC
Hemoglobin: 14.9 g/dL (ref 13.0–17.0)
RBC: 4.94 MIL/uL (ref 4.22–5.81)
RDW: 12.3 % (ref 11.5–15.5)

## 2010-04-29 LAB — POCT CARDIAC MARKERS
CKMB, poc: <1 ng/mL — ABNORMAL LOW (ref 1.0–8.0)
Myoglobin, poc: 37.4 ng/mL (ref 12–200)

## 2010-04-29 LAB — DIFFERENTIAL
Basophils Absolute: 0.1 10*3/uL (ref 0.0–0.1)
Lymphocytes Relative: 25 % (ref 12–46)
Monocytes Absolute: 0.7 10*3/uL (ref 0.1–1.0)
Neutro Abs: 6.4 10*3/uL (ref 1.7–7.7)

## 2010-06-21 ENCOUNTER — Other Ambulatory Visit: Payer: Self-pay

## 2010-06-22 MED ORDER — ESOMEPRAZOLE MAGNESIUM 40 MG PO CPDR: 40.0000 mg | DELAYED_RELEASE_CAPSULE | Freq: Every day | ORAL | Status: DC

## 2010-06-25 ENCOUNTER — Emergency Department (INDEPENDENT_AMBULATORY_CARE_PROVIDER_SITE_OTHER): Payer: Medicare Other

## 2010-06-25 ENCOUNTER — Emergency Department (HOSPITAL_BASED_OUTPATIENT_CLINIC_OR_DEPARTMENT_OTHER)
Admission: EM | Admit: 2010-06-25 | Discharge: 2010-06-25 | Disposition: A | Discharge status: Home / Self Care | Payer: Medicare Other | Attending: Emergency Medicine | Admitting: Emergency Medicine

## 2010-06-25 DIAGNOSIS — R29898 Other symptoms and signs involving the musculoskeletal system: Secondary | ICD-10-CM | POA: Insufficient documentation

## 2010-06-25 DIAGNOSIS — I10 Essential (primary) hypertension: Secondary | ICD-10-CM | POA: Insufficient documentation

## 2010-06-25 DIAGNOSIS — M6281 Muscle weakness (generalized): Secondary | ICD-10-CM

## 2010-06-25 DIAGNOSIS — F17200 Nicotine dependence, unspecified, uncomplicated: Secondary | ICD-10-CM | POA: Insufficient documentation

## 2010-06-25 DIAGNOSIS — R079 Chest pain, unspecified: Secondary | ICD-10-CM

## 2010-06-25 DIAGNOSIS — R259 Unspecified abnormal involuntary movements: Secondary | ICD-10-CM | POA: Insufficient documentation

## 2010-06-25 DIAGNOSIS — F489 Nonpsychotic mental disorder, unspecified: Secondary | ICD-10-CM | POA: Insufficient documentation

## 2010-06-25 DIAGNOSIS — F319 Bipolar disorder, unspecified: Secondary | ICD-10-CM | POA: Insufficient documentation

## 2010-06-25 DIAGNOSIS — M79609 Pain in unspecified limb: Secondary | ICD-10-CM | POA: Insufficient documentation

## 2010-06-25 LAB — DIFFERENTIAL
Basophils Absolute: 0.1 10*3/uL (ref 0.0–0.1)
Basophils Relative: 1 % (ref 0–1)
Eosinophils Absolute: 0.7 10*3/uL (ref 0.0–0.7)
Eosinophils Relative: 6 % — ABNORMAL HIGH (ref 0–5)
Monocytes Absolute: 0.9 10*3/uL (ref 0.1–1.0)
Neutro Abs: 6.5 10*3/uL (ref 1.7–7.7)

## 2010-06-25 LAB — BASIC METABOLIC PANEL
Calcium: 9.6 mg/dL (ref 8.4–10.5)
GFR calc Af Amer: >60 mL/min (ref >60)
GFR calc non Af Amer: >60 mL/min (ref >60)
Potassium: 3.5 mEq/L (ref 3.5–5.1)
Sodium: 140 mEq/L (ref 135–145)

## 2010-06-25 LAB — CBC
Hemoglobin: 15.1 g/dL (ref 13.0–17.0)
MCHC: 35.2 g/dL (ref 30.0–36.0)
Platelets: 222 10*3/uL (ref 150–400)
RDW: 13.3 % (ref 11.5–15.5)

## 2010-07-22 ENCOUNTER — Other Ambulatory Visit: Payer: Self-pay

## 2010-07-22 MED ORDER — IMIPRAMINE HCL 10 MG PO TABS
30.0000 mg | ORAL_TABLET | Freq: Every day | ORAL | Status: DC
Start: 2010-07-22 — End: 2010-12-14

## 2010-09-07 ENCOUNTER — Ambulatory Visit: Payer: Medicare Other | Attending: Physical Medicine and Rehabilitation | Admitting: Physical Therapy

## 2010-09-07 DIAGNOSIS — M545 Low back pain, unspecified: Secondary | ICD-10-CM | POA: Insufficient documentation

## 2010-09-07 DIAGNOSIS — M2569 Stiffness of other specified joint, not elsewhere classified: Secondary | ICD-10-CM | POA: Insufficient documentation

## 2010-09-07 DIAGNOSIS — IMO0001 Reserved for inherently not codable concepts without codable children: Secondary | ICD-10-CM | POA: Insufficient documentation

## 2010-09-07 DIAGNOSIS — R5381 Other malaise: Secondary | ICD-10-CM | POA: Insufficient documentation

## 2010-09-09 ENCOUNTER — Encounter: Payer: Medicare Other | Admitting: Physical Therapy

## 2010-09-14 ENCOUNTER — Ambulatory Visit: Payer: Medicare Other | Admitting: *Deleted

## 2010-09-21 ENCOUNTER — Encounter: Payer: Medicare Other | Admitting: Physical Therapy

## 2010-10-07 ENCOUNTER — Encounter: Payer: Medicare Other | Admitting: Physical Therapy

## 2010-10-13 ENCOUNTER — Encounter (HOSPITAL_BASED_OUTPATIENT_CLINIC_OR_DEPARTMENT_OTHER): Payer: Self-pay | Admitting: *Deleted

## 2010-10-13 ENCOUNTER — Emergency Department (HOSPITAL_BASED_OUTPATIENT_CLINIC_OR_DEPARTMENT_OTHER)
Admission: EM | Admit: 2010-10-13 | Discharge: 2010-10-13 | Disposition: A | Discharge status: Home / Self Care | Payer: Medicare Other | Attending: Emergency Medicine | Admitting: Emergency Medicine

## 2010-10-13 ENCOUNTER — Emergency Department (INDEPENDENT_AMBULATORY_CARE_PROVIDER_SITE_OTHER): Payer: Medicare Other

## 2010-10-13 DIAGNOSIS — R1031 Right lower quadrant pain: Secondary | ICD-10-CM

## 2010-10-13 DIAGNOSIS — R109 Unspecified abdominal pain: Secondary | ICD-10-CM

## 2010-10-13 DIAGNOSIS — F319 Bipolar disorder, unspecified: Secondary | ICD-10-CM | POA: Insufficient documentation

## 2010-10-13 DIAGNOSIS — R112 Nausea with vomiting, unspecified: Secondary | ICD-10-CM

## 2010-10-13 DIAGNOSIS — K920 Hematemesis: Secondary | ICD-10-CM

## 2010-10-13 DIAGNOSIS — K7689 Other specified diseases of liver: Secondary | ICD-10-CM

## 2010-10-13 DIAGNOSIS — F17200 Nicotine dependence, unspecified, uncomplicated: Secondary | ICD-10-CM | POA: Insufficient documentation

## 2010-10-13 DIAGNOSIS — Z79899 Other long term (current) drug therapy: Secondary | ICD-10-CM | POA: Insufficient documentation

## 2010-10-13 LAB — CBC
HCT: 43.6 % (ref 39.0–52.0)
Hemoglobin: 15.1 g/dL (ref 13.0–17.0)
MCH: 30.5 pg (ref 26.0–34.0)
MCHC: 34.6 g/dL (ref 30.0–36.0)
MCV: 88.1 fL (ref 78.0–100.0)
Platelets: 225 K/uL (ref 150–400)
RBC: 4.95 MIL/uL (ref 4.22–5.81)
RDW: 13.1 % (ref 11.5–15.5)
WBC: 10.5 K/uL (ref 4.0–10.5)

## 2010-10-13 LAB — DIFFERENTIAL
Basophils Absolute: 0.1 K/uL (ref 0.0–0.1)
Basophils Relative: 1 % (ref 0–1)
Eosinophils Absolute: 0.6 10*3/uL (ref 0.0–0.7)
Eosinophils Relative: 5 % (ref 0–5)
Lymphocytes Relative: 31 % (ref 12–46)
Lymphs Abs: 3.2 10*3/uL (ref 0.7–4.0)
Monocytes Absolute: 0.7 K/uL (ref 0.1–1.0)
Monocytes Relative: 7 % (ref 3–12)
Neutro Abs: 6 K/uL (ref 1.7–7.7)
Neutrophils Relative %: 57 % (ref 43–77)

## 2010-10-13 LAB — URINALYSIS, ROUTINE W REFLEX MICROSCOPIC
Bilirubin Urine: NEGATIVE
Glucose, UA: NEGATIVE mg/dL
Hgb urine dipstick: NEGATIVE
Ketones, ur: NEGATIVE mg/dL
Leukocytes, UA: NEGATIVE
Nitrite: NEGATIVE
Protein, ur: NEGATIVE mg/dL
Specific Gravity, Urine: 1.008 (ref 1.005–1.030)
Urobilinogen, UA: 0.2 mg/dL (ref 0.0–1.0)
pH: 6 (ref 5.0–8.0)

## 2010-10-13 LAB — COMPREHENSIVE METABOLIC PANEL
Alkaline Phosphatase: 73 U/L (ref 39–117)
BUN: 4 mg/dL — ABNORMAL LOW (ref 6–23)
GFR calc Af Amer: >60 mL/min (ref >60)
GFR calc non Af Amer: >60 mL/min (ref >60)
Glucose, Bld: 90 mg/dL (ref 70–99)
Potassium: 3.5 mEq/L (ref 3.5–5.1)
Total Protein: 7 g/dL (ref 6.0–8.3)

## 2010-10-13 LAB — COMPREHENSIVE METABOLIC PANEL WITH GFR
ALT: 46 U/L (ref 0–53)
AST: 30 U/L (ref 0–37)
Albumin: 3.9 g/dL (ref 3.5–5.2)
CO2: 27 meq/L (ref 19–32)
Calcium: 9.7 mg/dL (ref 8.4–10.5)
Chloride: 104 meq/L (ref 96–112)
Creatinine, Ser: 0.5 mg/dL (ref 0.50–1.35)
Sodium: 140 meq/L (ref 135–145)
Total Bilirubin: 0.4 mg/dL (ref 0.3–1.2)

## 2010-10-13 LAB — LIPASE, BLOOD: Lipase: 20 U/L (ref 11–59)

## 2010-10-13 MED ORDER — PANTOPRAZOLE SODIUM 20 MG PO TBEC: 20.0000 mg | DELAYED_RELEASE_TABLET | Freq: Every day | ORAL | Status: DC

## 2010-10-13 MED ORDER — ONDANSETRON 8 MG PO TBDP
8.0000 mg | ORAL_TABLET | Freq: Once | ORAL | Status: AC
  Administered 2010-10-13: 8 mg via ORAL
  Filled 2010-10-13: qty 1

## 2010-10-13 MED ORDER — HYDROMORPHONE HCL 1 MG/ML IJ SOLN
1.0000 mg | Freq: Once | INTRAMUSCULAR | Status: AC
  Administered 2010-10-13: 1 mg via INTRAMUSCULAR
  Filled 2010-10-13: qty 1

## 2010-10-13 MED ORDER — GI COCKTAIL ~~LOC~~
30.0000 mL | Freq: Once | ORAL | Status: AC
  Administered 2010-10-13: 30 mL via ORAL
  Filled 2010-10-13: qty 30

## 2010-10-13 MED ORDER — PROMETHAZINE HCL 25 MG PO TABS: 25.0000 mg | ORAL_TABLET | Freq: Four times a day (QID) | ORAL | Status: DC | PRN

## 2010-10-13 MED ORDER — ACETAMINOPHEN-CODEINE #3 300-30 MG PO TABS: 1.0000 | ORAL_TABLET | Freq: Four times a day (QID) | ORAL | Status: AC | PRN

## 2010-10-13 NOTE — ED Notes (Signed)
Dr. Oletta Lamas now at bedside for eval.

## 2010-10-13 NOTE — ED Notes (Signed)
Pt to room 7 by ems via stretcher. Pt is a/a/in nad, smiling, reports rlq pain x this am "and I been vomiting blood all day..." pt rates his pain at 10/10 and requests pain medication. No iv access by ems.

## 2010-10-13 NOTE — Discharge Instructions (Signed)
 Please be sure to call and make an appointment with Dr. Wells to be rechecked.  Your CT scan done today was normal.  There were no kidney stones, gallstones, or appendicits.  Eat gently for the next few days, avoid heavy, fatty foods or anything spicy.  Avoid raw fruits or vegetables.  Take pain medication and nausea medication and antacid medication as prescribed.

## 2010-10-13 NOTE — ED Provider Notes (Signed)
History     CSN: [id number] Arrival date & time: 10/13/2010  3:13 PM  Chief Complaint  Patient presents with  . Abdominal Pain    (Consider location/radiation/quality/duration/timing/severity/associated sxs/prior treatment) HPI Comments: Patient reports that earlier this afternoon he had rather sudden onset of right lower caulk and abdominal pain followed by nausea and vomiting. He reports that he saw some red material in his emesis that he thought was blood. He mentioned that he did feel somewhat improved after vomiting but subsequently has had further episodes. He reports the pain is persistent but has a waxing and waning quality. He denies any constipation or diarrhea. He denies fevers or chills. He mentions that he did contact his primary care physician's office with Dr. Lysbeth Galas and they advised that he come to the emergency department as they would not be capable of diagnosis his problem. He reports no history of prior abdominal surgeries, no history of pancreatitis, no history of kidney stones. He denies any hematuria or urinary difficulty. He denies any prior symptoms similar to this. He reports his only past medical history is high blood pressure but he does not take medication for and also depression for which he takes Valium. She admits to smoking cigarettes but does not drink any alcohol and has no history of peptic ulcer disease.  Patient is a 25 y.o. male presenting with abdominal pain. The history is provided by the patient.  Abdominal Pain The primary symptoms of the illness include abdominal pain, nausea and vomiting. The primary symptoms of the illness do not include diarrhea.  Symptoms associated with the illness do not include constipation.    Past Medical History  Diagnosis Date  . ADHD (attention deficit hyperactivity disorder)   . Bipolar affective disorder   . GERD (gastroesophageal reflux disease)   . Liver disease     fatty  . Inflammatory bowel disease     Past  Surgical History  Procedure Date  . Tympanostomy tube placement     History reviewed. No pertinent family history.  History  Substance Use Topics  . Smoking status: Not on file  . Smokeless tobacco: Not on file  . Alcohol Use: Not on file      Review of Systems  Constitutional: Negative.   Gastrointestinal: Positive for nausea, vomiting and abdominal pain. Negative for diarrhea, constipation and blood in stool.  Genitourinary: Negative for testicular pain.  Neurological: Negative for dizziness.  All other systems reviewed and are negative.    Allergies  Ketorolac tromethamine and Lorazepam  Home Medications   Current Outpatient Rx  Name Route Sig Dispense Refill  . ESOMEPRAZOLE MAGNESIUM 40 MG PO CPDR Oral Take 1 capsule (40 mg total) by mouth daily before breakfast. 31 capsule 5  . IMIPRAMINE HCL 10 MG PO TABS Oral Take 3 tablets (30 mg total) by mouth at bedtime. 90 tablet 3  . POLYETHYLENE GLYCOL 3350 PO POWD Oral Take 17 g by mouth daily.        BP 135/78  Pulse 87  Temp(Src) 98 F (36.7 C) (Oral)  Resp 20  SpO2 98%  Physical Exam  Constitutional: He is oriented to person, place, and time. He appears well-developed and well-nourished. No distress.  HENT:  Head: Normocephalic and atraumatic.  Eyes: Pupils are equal, round, and reactive to light. No scleral icterus.  Neck: Normal range of motion. Neck supple.  Cardiovascular: Normal rate and regular rhythm.   Pulmonary/Chest: Effort normal and breath sounds normal. No respiratory distress.  Abdominal: Normal  appearance and bowel sounds are normal. There is no tenderness. There is CVA tenderness. There is no rebound. No hernia. Hernia confirmed negative in the ventral area, confirmed negative in the right inguinal area and confirmed negative in the left inguinal area.  Genitourinary: Testes normal and penis normal.  Neurological: He is alert and oriented to person, place, and time.  Skin: Skin is warm and dry.  No rash noted. He is not diaphoretic.  Psychiatric: He has a normal mood and affect.    ED Course  Procedures      MDM  Pt with some RLQ pain, no guard or rebound on exam, appears well.  Not pale, not diaphoretic.  Possibly pt is having symptoms of ureteral colic.  Will check UA and obtain noc ontrast CT to assess for stone and also r/o appendicitis.  Even though no contrast, pt's BMI is high.  Will give IM dilaudid and PO zofran.  I suspect hematemesis is minor and likely related to a mallory weiss tear.  Will monitor for any worsening symptoms.    Vitals signs reviewed, sats are normal on RA at 98%.    4:11 PM WBC, Hgb are normal.  UA shows no infection or hematuria.    4:16 PM CT scan non contrast shows no acute abn's per radiologist.  Appendix appeared normal.    4:37 PM LFT's and lipase are normal.  Pt's BUN is 4 with normal creatinine suggesting no dehydration either.  If pt can tolerate PO's, he is safe for discharge to home and close follow up with PCP.  Repeat exam continues to show soft abdomen without guard or rebound.    Gavin Pound. Atreyu Mak, MD 10/13/10 1610

## 2010-10-29 LAB — CBC
MCHC: 34.4
MCV: 89.5
Platelets: 231
RDW: 13.5

## 2010-10-29 LAB — BASIC METABOLIC PANEL
BUN: 8
CO2: 24
Calcium: 8.9
Creatinine, Ser: 0.64
GFR calc Af Amer: >60
Glucose, Bld: 89

## 2010-10-29 LAB — POCT CARDIAC MARKERS
Myoglobin, poc: 40.3
Operator id: 4531

## 2010-10-29 LAB — DIFFERENTIAL
Basophils Absolute: 0.1
Basophils Relative: 1
Eosinophils Absolute: 0.7
Neutro Abs: 4.6
Neutrophils Relative %: 44

## 2010-11-01 LAB — POCT URINALYSIS DIP (DEVICE)
Glucose, UA: NEGATIVE
Hgb urine dipstick: NEGATIVE
Ketones, ur: NEGATIVE
Protein, ur: NEGATIVE
Specific Gravity, Urine: 1.01
Urobilinogen, UA: 0.2

## 2010-12-14 ENCOUNTER — Other Ambulatory Visit: Payer: Self-pay

## 2010-12-14 MED ORDER — IMIPRAMINE HCL 10 MG PO TABS: ORAL_TABLET | ORAL | Status: DC

## 2011-01-12 ENCOUNTER — Telehealth: Payer: Self-pay | Admitting: Gastroenterology

## 2011-01-12 MED ORDER — OMEPRAZOLE 20 MG PO CPDR: 20.0000 mg | DELAYED_RELEASE_CAPSULE | Freq: Every day | ORAL | Status: DC

## 2011-01-12 NOTE — Telephone Encounter (Signed)
Routed to refill box 

## 2011-01-12 NOTE — Telephone Encounter (Signed)
Pt called to ask if he could have a generic form of Nexium or something else that his medicaid would cover. He mentioned Prilosec and if we could call it in to [facility] at [phone] and his number is [phone]

## 2011-01-26 DIAGNOSIS — M545 Low back pain: Secondary | ICD-10-CM | POA: Diagnosis not present

## 2011-01-26 DIAGNOSIS — G4700 Insomnia, unspecified: Secondary | ICD-10-CM | POA: Diagnosis not present

## 2011-01-26 DIAGNOSIS — F411 Generalized anxiety disorder: Secondary | ICD-10-CM | POA: Diagnosis not present

## 2011-01-26 DIAGNOSIS — F329 Major depressive disorder, single episode, unspecified: Secondary | ICD-10-CM | POA: Diagnosis not present

## 2011-02-01 ENCOUNTER — Institutional Professional Consult (permissible substitution): Payer: Medicare Other | Admitting: Pulmonary Disease

## 2011-02-01 DIAGNOSIS — M545 Low back pain: Secondary | ICD-10-CM | POA: Diagnosis not present

## 2011-02-02 ENCOUNTER — Encounter: Payer: Self-pay | Admitting: Pulmonary Disease

## 2011-02-02 ENCOUNTER — Telehealth: Payer: Self-pay | Admitting: Pulmonary Disease

## 2011-02-02 DIAGNOSIS — R0609 Other forms of dyspnea: Secondary | ICD-10-CM | POA: Diagnosis not present

## 2011-02-02 DIAGNOSIS — R0602 Shortness of breath: Secondary | ICD-10-CM | POA: Diagnosis not present

## 2011-02-02 DIAGNOSIS — R5381 Other malaise: Secondary | ICD-10-CM | POA: Diagnosis not present

## 2011-02-02 NOTE — Telephone Encounter (Signed)
I spoke with pt and states he is going to have an ONO done and wanted to know if he needs to reschedule his apt with Dr. Vassie Loll. I advised him he is seeing Dr. Vassie Loll for sleep consult tomorrow for possible sleep disorder. Pt voiced his understanding and needed nothing further

## 2011-02-03 ENCOUNTER — Encounter: Payer: Self-pay | Admitting: Pulmonary Disease

## 2011-02-03 ENCOUNTER — Ambulatory Visit (INDEPENDENT_AMBULATORY_CARE_PROVIDER_SITE_OTHER): Payer: Medicare Other | Admitting: Pulmonary Disease

## 2011-02-03 ENCOUNTER — Telehealth: Payer: Self-pay | Admitting: Pulmonary Disease

## 2011-02-03 VITALS — BP 128/84 | HR 103 | Temp 98.7°F | Ht 75.0 in | Wt 321.0 lb

## 2011-02-03 DIAGNOSIS — G4733 Obstructive sleep apnea (adult) (pediatric): Secondary | ICD-10-CM | POA: Diagnosis not present

## 2011-02-03 NOTE — Telephone Encounter (Signed)
LMTCB for [employer]

## 2011-02-03 NOTE — Patient Instructions (Signed)
Schedule sleep study - do not nap that day

## 2011-02-03 NOTE — Assessment & Plan Note (Signed)
Given excessive daytime somnolence, narrow pharyngeal exam, witnessed apneas & loud snoring, obstructive sleep apnea is very likely & an overnight polysomnogram will be scheduled as a split study. The pathophysiology of obstructive sleep apnea , it's cardiovascular consequences & modes of treatment including CPAP were discused with the patient in detail & they evidenced understanding.  

## 2011-02-03 NOTE — Progress Notes (Signed)
  Subjective:    Patient ID: Marc Mayo, male    DOB: [birth year], 26 y.o.   MRN: [id number]  HPI PCP - N[place] 25/M, obese, bipolar smoker for evaluation of obstructive sleep apnea. 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 His gastroenterologist put him on Imipramine for irritable bowel. He also has chronic back pain & fatty liver. His wife has witnessed apneas, he has spontaneous awakenings & then cannot go back to sleep. ESS 11/24. Bedtime 10p, sleep latency upto 1 hr, sleeps on his back x  2pillows, 2-3 awakenings because 'chest hurts' & occ breathing problems, oob 0600, tired with dry mouth & headaches. Nocturnal oximetry on 01/04/11  showed what appears to be REM related desaturations, nadir 79% & 21 mins desatn < 88% There is no history suggestive of cataplexy, sleep paralysis or parasomnias He smokes 2 PPd, drinks caffeinated sodas x 2 daily   Review of Systems  Constitutional: Negative for fever, appetite change and unexpected weight change.  HENT: Negative for ear pain, congestion, sore throat, rhinorrhea, sneezing, trouble swallowing, dental problem and postnasal drip.   Eyes: Negative for redness.  Respiratory: Positive for shortness of breath. Negative for cough and wheezing.   Cardiovascular: Negative for chest pain, palpitations and leg swelling.  Gastrointestinal: Negative for nausea, vomiting, abdominal pain and diarrhea.  Genitourinary: Negative for dysuria and urgency.  Musculoskeletal: Negative for joint swelling.  Skin: Negative for rash.  Neurological: Positive for headaches. Negative for syncope.  Hematological: Does not bruise/bleed easily.  Psychiatric/Behavioral: Negative for dysphoric mood. The patient is nervous/anxious.        Objective:   Physical Exam  Gen. Pleasant, obese, in no distress, normal affect ENT - no lesions, no post nasal drip, class 2-3 airway Neck: No JVD, no thyromegaly, no carotid bruits Lungs: no use of accessory muscles, no  dullness to percussion, decreased without rales or rhonchi  Cardiovascular: Rhythm regular, heart sounds  normal, no murmurs or gallops, no peripheral edema Abdomen: soft and non-tender, no hepatosplenomegaly, BS normal. Musculoskeletal: No deformities, no cyanosis or clubbing Neuro:  alert, non focal, no tremors        Assessment & Plan:

## 2011-02-03 NOTE — Telephone Encounter (Signed)
Spoke with Caryn Bee. He states that the pt was to be set up on o2 at hs based upon ONO ordered by his PCP.  He wants to know if we are going to set him up for his o2. I advised no, we saw him for initial visit for sleep today and who ever ordered the ONO needs to be the one to set up the o2.  Caryn Bee verbalized understanding and states nothing further needed.

## 2011-02-10 ENCOUNTER — Encounter: Payer: Self-pay | Admitting: Pulmonary Disease

## 2011-02-21 ENCOUNTER — Ambulatory Visit (HOSPITAL_BASED_OUTPATIENT_CLINIC_OR_DEPARTMENT_OTHER): Payer: Medicare Other | Attending: Pulmonary Disease | Admitting: Radiology

## 2011-02-21 VITALS — Ht 75.0 in | Wt 304.0 lb

## 2011-02-21 DIAGNOSIS — Z79899 Other long term (current) drug therapy: Secondary | ICD-10-CM | POA: Insufficient documentation

## 2011-02-21 DIAGNOSIS — R0989 Other specified symptoms and signs involving the circulatory and respiratory systems: Secondary | ICD-10-CM | POA: Insufficient documentation

## 2011-02-21 DIAGNOSIS — G4763 Sleep related bruxism: Secondary | ICD-10-CM | POA: Diagnosis not present

## 2011-02-21 DIAGNOSIS — R51 Headache: Secondary | ICD-10-CM | POA: Diagnosis not present

## 2011-02-21 DIAGNOSIS — G4733 Obstructive sleep apnea (adult) (pediatric): Secondary | ICD-10-CM | POA: Insufficient documentation

## 2011-02-21 DIAGNOSIS — R0609 Other forms of dyspnea: Secondary | ICD-10-CM | POA: Insufficient documentation

## 2011-02-22 DIAGNOSIS — M545 Low back pain: Secondary | ICD-10-CM | POA: Diagnosis not present

## 2011-02-24 ENCOUNTER — Telehealth: Payer: Self-pay | Admitting: Pulmonary Disease

## 2011-02-24 DIAGNOSIS — G4763 Sleep related bruxism: Secondary | ICD-10-CM | POA: Diagnosis not present

## 2011-02-24 DIAGNOSIS — G4733 Obstructive sleep apnea (adult) (pediatric): Secondary | ICD-10-CM

## 2011-02-24 DIAGNOSIS — Z79899 Other long term (current) drug therapy: Secondary | ICD-10-CM

## 2011-02-24 DIAGNOSIS — R0609 Other forms of dyspnea: Secondary | ICD-10-CM | POA: Diagnosis not present

## 2011-02-24 DIAGNOSIS — R0989 Other specified symptoms and signs involving the circulatory and respiratory systems: Secondary | ICD-10-CM | POA: Diagnosis not present

## 2011-02-24 DIAGNOSIS — R51 Headache: Secondary | ICD-10-CM | POA: Diagnosis not present

## 2011-02-24 NOTE — Telephone Encounter (Signed)
PSG showed moderate OSA -AHI 30/h  If agreeable, start autoCPAP 5-15 cm with medium fullface mask, humidity, download in 4 weeks Arrange FU OV

## 2011-02-25 NOTE — Procedures (Signed)
Marc Mayo, Marc Mayo               ACCOUNT NO.:  [id number]  MEDICAL RECORD NO.:  [id number]          PATIENT TYPE:  OUT  LOCATION:  SLEEP CENTER                 FACILITY:  [facility]  PHYSICIAN:  Oretha Milch, MD      DATE OF BIRTH:  [birth year]  DATE OF STUDY:  02/21/2011                           NOCTURNAL POLYSOMNOGRAM  REFERRING PHYSICIAN:  Oretha Milch, MD  INDICATION FOR STUDY:  Excessive daytime sleepiness, loud snoring, headaches in this 26 year old obese gentleman with bipolar disorder.  At the time of the study, he weighed 304 pounds with a height of 6 feet 3 inches, BMI of 38.  Neck size of 16.5 inch.  EPWORTH SLEEPINESS SCORE:  22.  MEDICATIONS:  Bedtime medications zolpidem at 10:45 p.m.  This nocturnal polysomnogram was performed with a sleep technologist in attendance.  EEG, EOG, EMG, EKG, and respiratory parameters were recorded.  Sleep stages arousals, limb movements, and respiratory data were scored according to criteria laid out by the American Academy of Sleep Medicine.  SLEEP ARCHITECTURE:  Lights-out was at 11:03 p.m., lights-on, was at 5:55 a.m.  Total sleep time was 279 minutes of sleep period with a sleep period time of 356 minutes and a sleep efficiency was 68%.  Sleep latency was 55 minutes.  Latency to REM sleep was 220 minutes.  Sleep stage as a percentage of total sleep time was N1 12%, N2 41%, N3 20%, and then REM sleep 26% (74 minutes).  Supine sleep accounted for 72 minutes.  Supine REM sleep was not noted.  REM sleep was noted in one long stage around 4:30 a.m.  AROUSAL DATA:  There were total of 170 arousals with an arousal index of 37 events per hour.  Of these 108 were spontaneous, and the rest were associated with respiratory events.  RESPIRATORY DATA:  There were total of 1 obstructive apnea, 1 central apnea, 0 mixed apneas, and 82 hypopneas with an apnea-hypopnea index of 18 events per hour, 58 RERAs were noted with an RDI of 30 events  per hour.  Events were not worse with REM sleep, however they seem to be worse in supine sleep, so with a supine RDI of 19 events per hour.  OXYGEN DATA:  Desaturation index was 22 events per hour with a lowest desaturation of 85%.  He spent 1.2 minutes with a saturation less than 88%.  CARDIAC DATA:  Low heart rate was 30 beats per minute.  The high heart rate recorded was an artifact.  DISCUSSION:  Bruxism was noted.  He did not have enough sleep to warrant CPAP intervention for split protocol.  He was desensitized with a medium full-face mask.  IMPRESSIONS: 1. Moderate obstructive sleep apnea with predominant hypopneas and     respiratory-effort related arousals causing sleep fragmentation and     mild oxygen desaturation. 2. Bruxism was noted. 3. No evidence of cardiac arrhythmias or limb movements during sleep.  Recommendations: 1. Treatment option for this degree of sleep-disordered breathing     includes CPAP therapy, weight loss, oral appliances can also be     considered. 2. He should be cautioned against driving when sleepy, should be  advised against medications with sedative side effects.     Oretha Milch, MD    RVA/MEDQ  D:  02/24/2011 08:27:43  T:  02/25/2011 01:25:27  Job:  [id number]

## 2011-02-28 NOTE — Telephone Encounter (Signed)
I informed pt of RA's findings and recommendations. Pt verbalized understanding. Order placed   

## 2011-03-02 DIAGNOSIS — M47817 Spondylosis without myelopathy or radiculopathy, lumbosacral region: Secondary | ICD-10-CM | POA: Diagnosis not present

## 2011-03-07 DIAGNOSIS — Z79899 Other long term (current) drug therapy: Secondary | ICD-10-CM | POA: Diagnosis not present

## 2011-03-07 DIAGNOSIS — M47817 Spondylosis without myelopathy or radiculopathy, lumbosacral region: Secondary | ICD-10-CM | POA: Diagnosis not present

## 2011-03-07 DIAGNOSIS — F319 Bipolar disorder, unspecified: Secondary | ICD-10-CM | POA: Diagnosis not present

## 2011-03-07 DIAGNOSIS — M545 Low back pain: Secondary | ICD-10-CM | POA: Diagnosis not present

## 2011-03-11 DIAGNOSIS — M47817 Spondylosis without myelopathy or radiculopathy, lumbosacral region: Secondary | ICD-10-CM | POA: Diagnosis not present

## 2011-03-11 DIAGNOSIS — G894 Chronic pain syndrome: Secondary | ICD-10-CM | POA: Diagnosis not present

## 2011-03-30 DIAGNOSIS — T887XXA Unspecified adverse effect of drug or medicament, initial encounter: Secondary | ICD-10-CM | POA: Diagnosis not present

## 2011-03-30 DIAGNOSIS — M47817 Spondylosis without myelopathy or radiculopathy, lumbosacral region: Secondary | ICD-10-CM | POA: Diagnosis not present

## 2011-04-07 DIAGNOSIS — J019 Acute sinusitis, unspecified: Secondary | ICD-10-CM | POA: Diagnosis not present

## 2011-04-11 ENCOUNTER — Ambulatory Visit: Payer: Medicare Other | Admitting: Pulmonary Disease

## 2011-04-11 ENCOUNTER — Encounter: Payer: Self-pay | Admitting: Pulmonary Disease

## 2011-04-11 ENCOUNTER — Ambulatory Visit (INDEPENDENT_AMBULATORY_CARE_PROVIDER_SITE_OTHER): Payer: Medicare Other | Admitting: Pulmonary Disease

## 2011-04-11 VITALS — BP 130/82 | HR 95 | Temp 97.9°F | Ht 73.0 in | Wt 310.0 lb

## 2011-04-11 DIAGNOSIS — Z720 Tobacco use: Secondary | ICD-10-CM

## 2011-04-11 DIAGNOSIS — R079 Chest pain, unspecified: Secondary | ICD-10-CM | POA: Diagnosis not present

## 2011-04-11 DIAGNOSIS — F17200 Nicotine dependence, unspecified, uncomplicated: Secondary | ICD-10-CM | POA: Diagnosis not present

## 2011-04-11 DIAGNOSIS — G4733 Obstructive sleep apnea (adult) (pediatric): Secondary | ICD-10-CM | POA: Diagnosis not present

## 2011-04-11 NOTE — Assessment & Plan Note (Signed)
Trial of albuterol inhaler Use DELSYM cough syrup 5-10 ml thrice daily as needed for cough Used spirometry results to motivate him to quit

## 2011-04-11 NOTE — Patient Instructions (Signed)
Breathing test shows smoking is affecting your lungs Trial of albuterol inhaler Use DELSYM cough syrup 5-10 ml thrice daily as needed for cough Trial of nasal prongs with your cpap machine - turn in the card in 2 weeks You have to lose weight - aim for at least 2 lbs wt loss this year

## 2011-04-11 NOTE — Assessment & Plan Note (Addendum)
Trial of nasal prongs with your cpap machine - turn in the card in 2 weeks You have to lose weight - aim for at least 20 lbs wt loss this year  Weight loss encouraged, compliance with goal of at least 4-6 hrs every night is the expectation. Advised against medications with sedative side effects Cautioned against driving when sleepy - understanding that sleepiness will vary on a day to day basis  Change to fixed pr 10 cm

## 2011-04-11 NOTE — Progress Notes (Addendum)
  Subjective:    Patient ID: Marc Mayo, male    DOB: 04-15-1985, 26 y.o.   MRN: [id number]  HPI PCP - [place]  25/M, obese, bipolar smoker for evaluation of obstructive sleep apnea.  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 His gastroenterologist put him on Imipramine for irritable bowel. He also has chronic back pain & fatty liver.  His wife has witnessed apneas, he has spontaneous awakenings & then cannot go back to sleep.  ESS 11/24.  Bedtime 10p, sleep latency upto 1 hr, sleeps on his back x 2pillows, 2-3 awakenings because 'chest hurts' & occ breathing problems, oob 0600, tired with dry mouth & headaches.  Nocturnal oximetry on 01/04/11 showed what appears to be REM related desaturations, nadir 79% & 21 mins desatn < 88%  He smokes 2 PPd, drinks caffeinated sodas x 2 daily PSG showed moderate OSA -AHI 30/h  Started on autoCPAP 5-15 cm with medium fullface mask, humidity, download in 4 weeks  04/11/2011 Pt states his cpap makes him feel like he is suffocating.He wears it 5 nights out of the week. He also takes his mask off during the night w/o knowing it. Wakes up with headache He also wants to be evaluated for 'bronchitis'  -Smokes 1/2 ppd,tried chantix- did not help Wakes up with coughing - clear mucus, cough syrup did not help Has albuterol nebs, has questions about advair CXR 3/12 clear Spirometry >> showed no airway obstruction- fev1 88%  CPPA download 2/13 shos poor compliance, avg pr 10 cm  Review of Systems Patient denies significant dyspnea,cough, hemoptysis,  chest pain, palpitations, pedal edema, orthopnea, paroxysmal nocturnal dyspnea, lightheadedness, nausea, vomiting, abdominal or  leg pains      Objective:   Physical Exam  Gen. Pleasant, obese, in no distress, normal affect ENT - no lesions, no post nasal drip, class 2-3 airway Neck: No JVD, no thyromegaly, no carotid bruits Lungs: no use of accessory muscles, no dullness to percussion, decreased without  rales or rhonchi  Cardiovascular: Rhythm regular, heart sounds  normal, no murmurs or gallops, no peripheral edema Abdomen: soft and non-tender, no hepatosplenomegaly, BS normal. Musculoskeletal: No deformities, no cyanosis or clubbing Neuro:  alert, non focal, no tremors        Assessment & Plan:

## 2011-04-12 NOTE — Progress Notes (Signed)
Addended by: Oretha Milch on: 04/12/2011 01:42 PM   Modules accepted: Orders

## 2011-04-18 ENCOUNTER — Encounter: Payer: Self-pay | Admitting: Pulmonary Disease

## 2011-04-26 DIAGNOSIS — M47817 Spondylosis without myelopathy or radiculopathy, lumbosacral region: Secondary | ICD-10-CM | POA: Diagnosis not present

## 2011-04-29 DIAGNOSIS — G894 Chronic pain syndrome: Secondary | ICD-10-CM | POA: Diagnosis not present

## 2011-04-29 DIAGNOSIS — M47817 Spondylosis without myelopathy or radiculopathy, lumbosacral region: Secondary | ICD-10-CM | POA: Diagnosis not present

## 2011-05-02 ENCOUNTER — Ambulatory Visit: Payer: Medicare Other | Admitting: Pulmonary Disease

## 2011-05-10 DIAGNOSIS — M47817 Spondylosis without myelopathy or radiculopathy, lumbosacral region: Secondary | ICD-10-CM | POA: Diagnosis not present

## 2011-05-30 DIAGNOSIS — IMO0001 Reserved for inherently not codable concepts without codable children: Secondary | ICD-10-CM | POA: Diagnosis not present

## 2011-05-30 DIAGNOSIS — M47817 Spondylosis without myelopathy or radiculopathy, lumbosacral region: Secondary | ICD-10-CM | POA: Diagnosis not present

## 2011-05-30 DIAGNOSIS — G894 Chronic pain syndrome: Secondary | ICD-10-CM | POA: Diagnosis not present

## 2011-06-20 DIAGNOSIS — M47817 Spondylosis without myelopathy or radiculopathy, lumbosacral region: Secondary | ICD-10-CM | POA: Diagnosis not present

## 2011-06-27 DIAGNOSIS — M47817 Spondylosis without myelopathy or radiculopathy, lumbosacral region: Secondary | ICD-10-CM | POA: Diagnosis not present

## 2011-06-27 DIAGNOSIS — G894 Chronic pain syndrome: Secondary | ICD-10-CM | POA: Diagnosis not present

## 2011-07-26 DIAGNOSIS — G894 Chronic pain syndrome: Secondary | ICD-10-CM | POA: Diagnosis not present

## 2011-07-26 DIAGNOSIS — M47817 Spondylosis without myelopathy or radiculopathy, lumbosacral region: Secondary | ICD-10-CM | POA: Diagnosis not present

## 2011-08-23 DIAGNOSIS — M47817 Spondylosis without myelopathy or radiculopathy, lumbosacral region: Secondary | ICD-10-CM | POA: Diagnosis not present

## 2011-08-23 DIAGNOSIS — G894 Chronic pain syndrome: Secondary | ICD-10-CM | POA: Diagnosis not present

## 2011-09-20 DIAGNOSIS — M47817 Spondylosis without myelopathy or radiculopathy, lumbosacral region: Secondary | ICD-10-CM | POA: Diagnosis not present

## 2011-09-20 DIAGNOSIS — G894 Chronic pain syndrome: Secondary | ICD-10-CM | POA: Diagnosis not present

## 2011-10-07 ENCOUNTER — Encounter (HOSPITAL_COMMUNITY): Payer: Self-pay | Admitting: Emergency Medicine

## 2011-10-07 ENCOUNTER — Emergency Department (INDEPENDENT_AMBULATORY_CARE_PROVIDER_SITE_OTHER)
Admission: EM | Admit: 2011-10-07 | Discharge: 2011-10-07 | Disposition: A | Discharge status: Home / Self Care | Payer: Medicare Other | Source: Home / Self Care

## 2011-10-07 DIAGNOSIS — G43909 Migraine, unspecified, not intractable, without status migrainosus: Secondary | ICD-10-CM

## 2011-10-07 MED ORDER — ONDANSETRON HCL 4 MG/2ML IJ SOLN
INTRAMUSCULAR | Status: AC
  Filled 2011-10-07: qty 2

## 2011-10-07 MED ORDER — ONDANSETRON HCL 4 MG/2ML IJ SOLN: 4.0000 mg | Freq: Once | INTRAMUSCULAR | Status: DC

## 2011-10-07 MED ORDER — RIZATRIPTAN BENZOATE 10 MG PO TBDP: 10.0000 mg | ORAL_TABLET | ORAL | Status: DC | PRN

## 2011-10-07 MED ORDER — ONDANSETRON HCL 4 MG/2ML IJ SOLN
4.0000 mg | Freq: Once | INTRAMUSCULAR | Status: AC
  Administered 2011-10-07: 4 mg via INTRAMUSCULAR

## 2011-10-07 MED ORDER — SUMATRIPTAN SUCCINATE 6 MG/0.5ML ~~LOC~~ SOLN
6.0000 mg | Freq: Once | SUBCUTANEOUS | Status: AC
  Administered 2011-10-07: 6 mg via SUBCUTANEOUS

## 2011-10-07 MED ORDER — SUMATRIPTAN SUCCINATE 6 MG/0.5ML ~~LOC~~ SOLN
SUBCUTANEOUS | Status: AC
  Filled 2011-10-07: qty 0.5

## 2011-10-07 MED ORDER — SUMATRIPTAN SUCCINATE 6 MG/0.5ML ~~LOC~~ SOLN: 6.0000 mg | Freq: Once | SUBCUTANEOUS | Status: DC

## 2011-10-07 NOTE — ED Notes (Signed)
Pt c/o migraine headaches x6 days... Sx include: pain (6/10) on sides of head, blurry vision, dizzy, lightheaded, stomachache, coughing w/green sputum... Denies: fevers, vomiting, diarrhea.

## 2011-10-07 NOTE — ED Provider Notes (Signed)
Medical screening examination/treatment/procedure(s) were performed by non-physician practitioner and as supervising physician I was immediately available for consultation/collaboration.  Raynald Blend, MD 10/07/11 1215

## 2011-10-07 NOTE — ED Provider Notes (Signed)
History     CSN: [id number]  Arrival date & time 10/07/11  1478   None     Chief Complaint  Patient presents with  . Migraine    (Consider location/radiation/quality/duration/timing/severity/associated sxs/prior treatment) HPI Comments: The patient states that he developed a rather sudden onset of a headache associated with blurred vision dizziness approximately 5 days ago. The discomfort is constant he states the pain is bitemporal and frontal. It is a pounding feeling. He states he has a history of migraine headaches and has seen one or more doctors in the past who has given him that diagnosis however he states that this headache is one of the worst headaches he has ever had. The difference in this headache and other previous migraine headaches is that he usually does not have blurred vision or dizziness. Both the blurred vision and dizziness has improved, however he has taken Excedrin other OTC medications for headache and NSAIDs which have not helped. He also takes Percocet for her chronic low back pain. When taking the Percocet his headache goes away. He denies other associated neurologic problems   Past Medical History  Diagnosis Date  . ADHD (attention deficit hyperactivity disorder)   . Bipolar affective disorder   . GERD (gastroesophageal reflux disease)   . Liver disease     fatty  . Inflammatory bowel disease     Past Surgical History  Procedure Date  . Tympanostomy tube placement     No family history on file.  History  Substance Use Topics  . Smoking status: Current Every Day Smoker -- 2.0 packs/day for 9 years    Types: Cigarettes  . Smokeless tobacco: Never Used  . Alcohol Use: No      Review of Systems  Constitutional: Negative.   HENT: Negative for hearing loss, ear pain, congestion, neck pain, neck stiffness and ear discharge.   Eyes: Positive for visual disturbance. Negative for discharge and redness.  Respiratory: Negative.   Cardiovascular:  Negative.   Gastrointestinal: Positive for nausea. Negative for vomiting.  Genitourinary: Negative.   Musculoskeletal: Positive for back pain.  Skin: Negative.   Neurological: Positive for dizziness, light-headedness and headaches. Negative for tremors, seizures, syncope, facial asymmetry, speech difficulty and weakness.  Psychiatric/Behavioral: Negative.     Allergies  Ketorolac tromethamine; Lorazepam; Paroxetine hcl; Prozac; and Sertraline hcl  Home Medications   Current Outpatient Rx  Name Route Sig Dispense Refill  . OXYCODONE-ACETAMINOPHEN 5-325 MG PO TABS Oral Take 1-2 tablets by mouth daily as needed.    . ALPRAZOLAM 1 MG PO TABS Oral Take 1 mg by mouth daily.    Marland Kitchen DIAZEPAM 10 MG PO TABS Oral Take 10 mg by mouth daily.      . IMIPRAMINE HCL 10 MG PO TABS  Take three po at bedtime. 90 tablet 3  . OMEPRAZOLE 20 MG PO CPDR Oral Take 1 capsule (20 mg total) by mouth daily. 30 capsule 11  . POLYETHYLENE GLYCOL 3350 PO POWD Oral Take 17 g by mouth daily.      Marland Kitchen RIZATRIPTAN BENZOATE 10 MG PO TBDP Oral Take 1 tablet (10 mg total) by mouth as needed for migraine. May repeat in 2 hours if needed 10 tablet 0    There were no vitals taken for this visit.  Physical Exam  Constitutional: He is oriented to person, place, and time. He appears well-developed and well-nourished. No distress.  HENT:  Head: Atraumatic.  Left Ear: External ear normal.  Mouth/Throat: Oropharynx is clear and  moist. No oropharyngeal exudate.  Eyes: EOM are normal. Pupils are equal, round, and reactive to light. Right eye exhibits discharge. Left eye exhibits no discharge.  Neck: Normal range of motion. Neck supple.  Cardiovascular: Normal rate and normal heart sounds.   Pulmonary/Chest: Effort normal and breath sounds normal. No respiratory distress. He has no wheezes.  Musculoskeletal: Normal range of motion. He exhibits no edema and no tenderness.  Lymphadenopathy:    He has no cervical adenopathy.    Neurological: He is alert and oriented to person, place, and time. He has normal strength. He displays no atrophy and no tremor. No cranial nerve deficit or sensory deficit. He exhibits normal muscle tone. He displays a negative Romberg sign. He displays no seizure activity. Coordination and gait normal. GCS eye subscore is 4. GCS verbal subscore is 5. GCS motor subscore is 6.       Heel-to-toe is normal. Allen's is normal. Fine motor skills normal. His speech and activity is energetic. Exhibits no signs of confusion or disorientation. Neurologic exam is entirely normal.  Skin: He is not diaphoretic.    ED Course  Procedures (including critical care time)  Labs Reviewed - No data to display No results found.   1. Migraine headache       MDM  Rx for Maxalt M. LT 10 mg as directed.  May also take Naprosyn or Aleve once a day when necessary headache. He states that he received considerable improvement with the Zofran IM injection and Imitrex injection. His headache is much better and he is no longer experiencing associated dizziness or other neurologic symptoms. Marland Kitchen He is directed to call his personal physician and discuss preventive care for his migraine headaches, he may be referred to a neurologist. For any changes in headache or new symptoms or problems especially doubled vision change in mentation confusion disorientation or unusual sleepiness weakness on one side of the body or other he is to go to the emergency department        Hayden Rasmussen, NP 10/07/11 1129

## 2011-10-19 DIAGNOSIS — M47817 Spondylosis without myelopathy or radiculopathy, lumbosacral region: Secondary | ICD-10-CM | POA: Diagnosis not present

## 2011-10-19 DIAGNOSIS — Z79899 Other long term (current) drug therapy: Secondary | ICD-10-CM | POA: Diagnosis not present

## 2011-10-19 DIAGNOSIS — G894 Chronic pain syndrome: Secondary | ICD-10-CM | POA: Diagnosis not present

## 2011-10-20 DIAGNOSIS — G43909 Migraine, unspecified, not intractable, without status migrainosus: Secondary | ICD-10-CM | POA: Diagnosis not present

## 2011-10-20 DIAGNOSIS — K219 Gastro-esophageal reflux disease without esophagitis: Secondary | ICD-10-CM | POA: Diagnosis not present

## 2011-10-20 DIAGNOSIS — J019 Acute sinusitis, unspecified: Secondary | ICD-10-CM | POA: Diagnosis not present

## 2011-11-16 DIAGNOSIS — H6980 Other specified disorders of Eustachian tube, unspecified ear: Secondary | ICD-10-CM | POA: Diagnosis not present

## 2011-11-16 DIAGNOSIS — G4733 Obstructive sleep apnea (adult) (pediatric): Secondary | ICD-10-CM | POA: Diagnosis not present

## 2011-11-16 DIAGNOSIS — H902 Conductive hearing loss, unspecified: Secondary | ICD-10-CM | POA: Diagnosis not present

## 2011-11-18 DIAGNOSIS — H6990 Unspecified Eustachian tube disorder, unspecified ear: Secondary | ICD-10-CM

## 2011-11-18 DIAGNOSIS — H6980 Other specified disorders of Eustachian tube, unspecified ear: Secondary | ICD-10-CM

## 2011-11-18 HISTORY — DX: Unspecified eustachian tube disorder, unspecified ear: H69.90

## 2011-11-18 HISTORY — DX: Other specified disorders of Eustachian tube, unspecified ear: H69.80

## 2011-11-23 ENCOUNTER — Other Ambulatory Visit: Payer: Self-pay

## 2011-11-23 ENCOUNTER — Telehealth: Payer: Self-pay | Admitting: Pulmonary Disease

## 2011-11-23 NOTE — Telephone Encounter (Signed)
Called, spoke with pt.  States Dr. Lazarus Salines wants pt to have a tonsillectomy and tubes placed in ears.  However, pt states Dr. Raye Sorrow nurse Amy says they need additional information/speak with Dr. Vassie Loll before they can do this.  Per pt, Amy states Dr. Lazarus Salines has called and "left a msg" for Dr. Vassie Loll but hasn't heard anything.  I do not see a msg in pt's chart regarding this.  I called Dr. Raye Sorrow office at [phone] and lmomtcb for Amy to see exactly what is needed.

## 2011-11-23 NOTE — Telephone Encounter (Signed)
Pt has called back & would like to discuss his visit w/ [place] [facility] doctor on [address].  Stated that ENT has rec'd a copy of the sleep study, but the ENT doc has been waiting on a call from RA.  Pt can be reached at [phone].  Antionette Fairy

## 2011-11-24 MED ORDER — OMEPRAZOLE 20 MG PO CPDR: 20.0000 mg | DELAYED_RELEASE_CAPSULE | Freq: Every day | ORAL | Status: DC

## 2011-11-24 NOTE — Telephone Encounter (Signed)
I spoke with nurse Amy at Dr. Raye Sorrow office and she states that Dr. Lazarus Salines and Dr. Vassie Loll have spoke regarding the pt. LMOMTCB x 1 for the pt so he is aware.

## 2011-11-24 NOTE — Telephone Encounter (Signed)
Needs OV for 2 year f/u for Dec 2013. Thanks!

## 2011-11-24 NOTE — Telephone Encounter (Signed)
Needs office visit for 2 year f/u in Dec 2013.

## 2011-11-25 NOTE — Telephone Encounter (Signed)
I spoke with pt spouse and she is aware of this. She will call Dr. Raye Sorrow office. Nothing further was needed

## 2011-11-28 DIAGNOSIS — M47817 Spondylosis without myelopathy or radiculopathy, lumbosacral region: Secondary | ICD-10-CM | POA: Diagnosis not present

## 2011-11-28 DIAGNOSIS — G894 Chronic pain syndrome: Secondary | ICD-10-CM | POA: Diagnosis not present

## 2011-12-06 ENCOUNTER — Encounter (HOSPITAL_BASED_OUTPATIENT_CLINIC_OR_DEPARTMENT_OTHER): Payer: Self-pay | Admitting: *Deleted

## 2011-12-06 NOTE — Pre-Procedure Instructions (Signed)
History of sleep apnea (confirmed by sleep study) and no CPAP use discussed with Dr. Ivin Booty; pt. OK to come for surgery, as he will be staying overnight

## 2011-12-08 ENCOUNTER — Encounter: Payer: Self-pay | Admitting: Gastroenterology

## 2011-12-08 DIAGNOSIS — H6980 Other specified disorders of Eustachian tube, unspecified ear: Secondary | ICD-10-CM | POA: Diagnosis not present

## 2011-12-08 DIAGNOSIS — H902 Conductive hearing loss, unspecified: Secondary | ICD-10-CM | POA: Diagnosis not present

## 2011-12-08 NOTE — Telephone Encounter (Signed)
Pt is aware of OV on 12/5 at 10 with AS and appt card was mailed

## 2011-12-08 NOTE — H&P (Signed)
Marc Mayo, Marc Mayo 26 y.o., male [id number]     Chief Complaint:  Chronic Eustachian tube dysfunction  HPI: Patient is a 26 year old male who presents for decreased hearing in the left ear and intermittent pain behind the right ear. He was seen in 2011; reported long standing hearing loss in the left ear. Surgeries include multiple sets of tubes as a child (about [age]). On exam, both TMs with moderate tympanosclerosis, the left with a possible pinpoint perforation. Audiometric testing revealed a mild conductive hearing loss on the left, however reliability was poor. Both tympanograms type B with earcanal volumes 1.6 on right, 2.6 on left. Advised strict water precautions for the left ear. Treated him for possible chronic serious OM with a course of antibiotics and Flonase. He did not return as advised in one month to recheck the hearing. Today he states that there is a "knot" behind the right ear that fluctuates in size. Denies any scalp lesions or abrasions. Hearing continues to be worse in the left ear. He has periodic drainage from the left ear, last episode was one month ago. A couple of months ago he was treated for an ear and sinus infection with antibiotics. Reports a few sinus infections per year. Reports having moderate OSA and has worn a CPAP in the past but is not currently using one due to non-compliance. Sleep study performed last year at [facility]. He does not recall having tonsillectomy or adenoidectomy. Is a current smoker.  Preoperative visit.  No change in overall status.  We will be watching him 23 hours extended recovery with CPAP after general anesthesia.   I discussed the surgery in detail including risks and complications, namely bilateral myringotomy with T tube placement,  and eustachian torus lymphoid tissue cautery.  Questions were answered and informed consent was obtained.  Postoperative advancement of diet and activity discussed  PMH: Past Medical History  Diagnosis Date   . ADHD (attention deficit hyperactivity disorder)   . Bipolar affective disorder   . GERD (gastroesophageal reflux disease)   . Headache     migraines  . History of gastric ulcer   . Fatty liver   . Chronic back pain   . Chronic eustachian tube dysfunction 11/2011    Surg Hx: Past Surgical History  Procedure Date  . Tympanostomy tube placement     x 7    FHx:  History reviewed. No pertinent family history. SocHx:  reports that he has been smoking Cigarettes.  He has a 22 pack-year smoking history. He has never used smokeless tobacco. He reports that he does not drink alcohol or use illicit drugs.  ALLERGIES:  Allergies  Allergen Reactions  . Alprazolam Other (See Comments)    EXCESSIVE DROWSINESS, ANGRY  . Sertraline Hcl Other (See Comments)    SUICIDAL  . Ketorolac Tromethamine Other (See Comments)    REACTION: Headaches, Blurred vision  . Lorazepam Other (See Comments)    HEADACHE  . Paroxetine Hcl Other (See Comments)    "HAS BAD THOUGHTS"  . Prozac (Fluoxetine Hcl) Other (See Comments)    "CAUSES BAD THOUGHTS AND DREAMS"    No prescriptions prior to admission    No results found for this or any previous visit (from the past 48 hour(s)). No results found.  WUJ:WJXBJYNW: Not feeling tired (fatigue).  No fever.  Night sweats.  No recent weight loss. Head: Headache. Eyes: No eye symptoms. Otolaryngeal: No hearing loss.  Earache  and tinnitus.  No purulent nasal discharge.  No nasal passage blockage (stuffiness).  Snoring.  No sneezing, no hoarseness, and no sore throat. Cardiovascular: No chest pain or discomfort  and no palpitations. Pulmonary: No dyspnea, no cough, and no wheezing. Gastrointestinal: No dysphagia.  Heartburn.  No nausea, no abdominal pain, and no melena.  No diarrhea. Genitourinary: No dysuria. Endocrine: No muscle weakness. Musculoskeletal: No calf muscle cramps, no arthralgias, and no soft tissue swelling. Neurological: No dizziness, no  fainting, no tingling, and no numbness. Psychological: No anxiety  and no depression. Skin: No rash.  Height 6' (1.829 m), weight 131.09 kg (289 lb).  BP:147/81,  HR: 96 b/min,   PHYSICAL EXAM: APPEARANCE: Well developed, obese, in no acute distress.  Normal affect, in a pleasant mood.  Oriented to time, place and person. COMMUNICATION: Normal voice   HEAD & FACE:  No scars, lesions or masses of head and face.  Sinuses nontender to palpation.  Salivary glands without mass or tenderness.  Facial strength symmetric.   EYES: EOMI with normal primary gaze alignment. Visual acuity grossly intact.  PERRLA EXTERNAL EAR & NOSE: No scars. Right post-auricular area with a small 0.5x0.5cm mobile, non-tender lymph node. No superficial skin changes.  EAC & TYMPANIC MEMBRANE:  Right TM intact with moderate tympanosclerosis posteriorly. Left TM with moderate tympanosclerosis posteriorly; drum retracted down on the promontory anteriorly with dimeric area. No perforation or infection.  GROSS HEARING: Intact TMJ:  Nontender  INTRANASAL EXAM: No polyps or purulence.  NASOPHARYNX: Normal, without lesions. LIPS, TEETH & GUMS: No lip lesions, normal dentition and normal gums. ORAL CAVITY/OROPHARYNX:  Oral mucosa moist without lesion or asymmetry of the palate, tongue or posterior pharynx. Tonsils small, symmetric. No evidence of cleft palate. Palpation of base of tongue and palate normal. NECK:  Supple without adenopathy or mass. THYROID:  Normal with no masses palpable.   Lungs: Clear to auscultation Heart: Regular rate and rhythm without murmur Abdomen: Obese and active Extremities: Normal configuration Neurologic: Symmetric and intact.  Fiberoptic Laryngoscopy Name: Maryland Luppino     Age: 35 year   Performing Provider: Aquilla Hacker. Dr. Lazarus Salines present for exam.  The risks of the procedure are minimal and were discussed with the patient today. Pre-op Diagnosis: chronic ETD Post-op Diagnosis:  Same Allergy:  reviewed allergies as listed Nasal Prep: Lidocaine/Afrin   Procedure: With the patient seated in the exam chair, the the right and left nasal cavities were intubated with the flexible laryngoscope.  The nasal cavity mucosa, nasopharynx, hypopharynx and larynx were all examined with findings as noted.  The scope was then removed.  The patient tolerated the procedure well without complication or blood loss.   FINDINGS: Nasal mucosa: normal  Nasopharynx: Eustachian tube tori with prominent lymphoid tissue bilaterally. Tori not well visualized. No adenoid scar visualized. Adenoid tissue not prominent. Hypopharynx: normal  Larynx: vocal cords normal bilaterally with good mobility; no mass, nodule or polyp. No postcricoid edema or erythema. Pyriform sinuses normal without aspiration or pooling of secretions.    Assessment/Plan  Eustachian tube dysfunction (381.81) (H69.80).  Conductive hearing loss (389.00) (H90.2). OSA  We are going to put long lasting T-tubes in both ears next week.  I'm also going to try to cauterize some extra tissue on your eustachian tubes so that sometime in the future, your eustachian tubes might work better long term, and maybe you won't need  tubes forever.  Hydrocodone-Acetaminophen 5-325 MG Oral Tablet;TAKE 1 TO 2 TABLETS EVERY 4 TO 6 HOURS AS NEEDED FOR PAIN; Qty30; R2; Rx.  Neomycin-Polymyxin-HC 3.5-10000-1 Otic Suspension;INSTILL 3 DROPS IN AFFECTED EAR(S) 3-4 TIMES DAILY; Qty10; R6; Rx.  Lazarus Salines, Cobie Leidner 12/08/2011, 2:59 PM

## 2011-12-12 ENCOUNTER — Encounter (HOSPITAL_BASED_OUTPATIENT_CLINIC_OR_DEPARTMENT_OTHER)
Admission: RE | Disposition: A | Discharge status: Home / Self Care | Payer: Self-pay | Source: Ambulatory Visit | Attending: Otolaryngology

## 2011-12-12 ENCOUNTER — Encounter (HOSPITAL_BASED_OUTPATIENT_CLINIC_OR_DEPARTMENT_OTHER): Payer: Self-pay | Admitting: *Deleted

## 2011-12-12 ENCOUNTER — Encounter (HOSPITAL_BASED_OUTPATIENT_CLINIC_OR_DEPARTMENT_OTHER): Payer: Self-pay | Admitting: Anesthesiology

## 2011-12-12 ENCOUNTER — Ambulatory Visit (HOSPITAL_BASED_OUTPATIENT_CLINIC_OR_DEPARTMENT_OTHER): Payer: Medicare Other | Admitting: Anesthesiology

## 2011-12-12 ENCOUNTER — Ambulatory Visit (HOSPITAL_BASED_OUTPATIENT_CLINIC_OR_DEPARTMENT_OTHER)
Admission: RE | Admit: 2011-12-12 | Discharge: 2011-12-13 | Disposition: A | Discharge status: Home / Self Care | Payer: Medicare Other | Source: Ambulatory Visit | Attending: Otolaryngology | Admitting: Otolaryngology

## 2011-12-12 DIAGNOSIS — Z8711 Personal history of peptic ulcer disease: Secondary | ICD-10-CM | POA: Insufficient documentation

## 2011-12-12 DIAGNOSIS — K219 Gastro-esophageal reflux disease without esophagitis: Secondary | ICD-10-CM | POA: Insufficient documentation

## 2011-12-12 DIAGNOSIS — H6980 Other specified disorders of Eustachian tube, unspecified ear: Secondary | ICD-10-CM | POA: Diagnosis not present

## 2011-12-12 DIAGNOSIS — F17200 Nicotine dependence, unspecified, uncomplicated: Secondary | ICD-10-CM | POA: Insufficient documentation

## 2011-12-12 DIAGNOSIS — H7409 Tympanosclerosis, unspecified ear: Secondary | ICD-10-CM | POA: Diagnosis not present

## 2011-12-12 DIAGNOSIS — F319 Bipolar disorder, unspecified: Secondary | ICD-10-CM | POA: Insufficient documentation

## 2011-12-12 DIAGNOSIS — G4733 Obstructive sleep apnea (adult) (pediatric): Secondary | ICD-10-CM | POA: Insufficient documentation

## 2011-12-12 DIAGNOSIS — R040 Epistaxis: Secondary | ICD-10-CM | POA: Diagnosis not present

## 2011-12-12 DIAGNOSIS — F909 Attention-deficit hyperactivity disorder, unspecified type: Secondary | ICD-10-CM | POA: Diagnosis not present

## 2011-12-12 DIAGNOSIS — H6990 Unspecified Eustachian tube disorder, unspecified ear: Secondary | ICD-10-CM | POA: Insufficient documentation

## 2011-12-12 DIAGNOSIS — K7689 Other specified diseases of liver: Secondary | ICD-10-CM | POA: Diagnosis not present

## 2011-12-12 HISTORY — PX: MYRINGOTOMY WITH TUBE PLACEMENT: SHX5663

## 2011-12-12 HISTORY — DX: Other specified disorders of Eustachian tube, unspecified ear: H69.80

## 2011-12-12 HISTORY — DX: Fatty (change of) liver, not elsewhere classified: K76.0

## 2011-12-12 HISTORY — PX: NASAL ENDOSCOPY WITH EPISTAXIS CONTROL: SHX5664

## 2011-12-12 SURGERY — MYRINGOTOMY WITH TUBE PLACEMENT
Anesthesia: General | Site: Nose | Laterality: Bilateral | Wound class: Clean Contaminated

## 2011-12-12 MED ORDER — HYDROCODONE-ACETAMINOPHEN 5-325 MG PO TABS
1.0000 | ORAL_TABLET | ORAL | Status: DC | PRN
  Administered 2011-12-12 (×2): 2 via ORAL
  Administered 2011-12-13: 1 via ORAL
  Administered 2011-12-13 (×2): 2 via ORAL

## 2011-12-12 MED ORDER — SUCCINYLCHOLINE CHLORIDE 20 MG/ML IJ SOLN
INTRAMUSCULAR | Status: DC | PRN
  Administered 2011-12-12: 100 mg via INTRAVENOUS

## 2011-12-12 MED ORDER — HYDROMORPHONE HCL PF 1 MG/ML IJ SOLN
0.2500 mg | INTRAMUSCULAR | Status: DC | PRN
  Administered 2011-12-12: 0.5 mg via INTRAVENOUS

## 2011-12-12 MED ORDER — SODIUM CHLORIDE 0.9 % IV SOLN
INTRAVENOUS | Status: DC
  Administered 2011-12-12: 100 mL/h via INTRAVENOUS

## 2011-12-12 MED ORDER — DEXAMETHASONE SODIUM PHOSPHATE 4 MG/ML IJ SOLN
INTRAMUSCULAR | Status: DC | PRN
  Administered 2011-12-12: 10 mg via INTRAVENOUS

## 2011-12-12 MED ORDER — OXYMETAZOLINE HCL 0.05 % NA SOLN: 2.0000 | NASAL | Status: AC

## 2011-12-12 MED ORDER — LACTATED RINGERS IV SOLN
INTRAVENOUS | Status: DC
  Administered 2011-12-12: 08:00:00 via INTRAVENOUS

## 2011-12-12 MED ORDER — CIPROFLOXACIN-DEXAMETHASONE 0.3-0.1 % OT SUSP: 4.0000 [drp] | Freq: Two times a day (BID) | OTIC | Status: DC

## 2011-12-12 MED ORDER — AMPICILLIN-SULBACTAM SODIUM 3 (2-1) G IJ SOLR
3.0000 g | INTRAMUSCULAR | Status: DC | PRN
  Administered 2011-12-12: 3 g via INTRAVENOUS

## 2011-12-12 MED ORDER — NICOTINE 14 MG/24HR TD PT24
14.0000 mg | MEDICATED_PATCH | Freq: Every day | TRANSDERMAL | Status: DC
  Administered 2011-12-12: 14 mg via TRANSDERMAL

## 2011-12-12 MED ORDER — LIDOCAINE HCL (CARDIAC) 20 MG/ML IV SOLN
INTRAVENOUS | Status: DC | PRN
  Administered 2011-12-12: 100 mg via INTRAVENOUS

## 2011-12-12 MED ORDER — OXYCODONE HCL 5 MG/5ML PO SOLN: 5.0000 mg | Freq: Once | ORAL | Status: DC | PRN

## 2011-12-12 MED ORDER — PHENYLEPHRINE HCL 0.5 % NA SOLN: 1.0000 [drp] | Freq: Four times a day (QID) | NASAL | Status: DC | PRN

## 2011-12-12 MED ORDER — ONDANSETRON HCL 4 MG/2ML IJ SOLN: 4.0000 mg | INTRAMUSCULAR | Status: DC | PRN

## 2011-12-12 MED ORDER — ONDANSETRON HCL 4 MG PO TABS: 4.0000 mg | ORAL_TABLET | ORAL | Status: DC | PRN

## 2011-12-12 MED ORDER — ONDANSETRON HCL 4 MG/2ML IJ SOLN: 4.0000 mg | Freq: Once | INTRAMUSCULAR | Status: DC | PRN

## 2011-12-12 MED ORDER — FENTANYL CITRATE 0.05 MG/ML IJ SOLN
INTRAMUSCULAR | Status: DC | PRN
  Administered 2011-12-12: 100 ug via INTRAVENOUS
  Administered 2011-12-12: 50 ug via INTRAVENOUS

## 2011-12-12 MED ORDER — SODIUM CHLORIDE 0.9 % IV SOLN: 2.0000 g | Freq: Once | INTRAVENOUS | Status: DC

## 2011-12-12 MED ORDER — PROPOFOL 10 MG/ML IV BOLUS
INTRAVENOUS | Status: DC | PRN
  Administered 2011-12-12: 300 mg via INTRAVENOUS

## 2011-12-12 MED ORDER — CIPROFLOXACIN-DEXAMETHASONE 0.3-0.1 % OT SUSP
OTIC | Status: DC | PRN
  Administered 2011-12-12: 4 [drp] via OTIC

## 2011-12-12 MED ORDER — MIDAZOLAM HCL 5 MG/5ML IJ SOLN
INTRAMUSCULAR | Status: DC | PRN
  Administered 2011-12-12: 2 mg via INTRAVENOUS

## 2011-12-12 MED ORDER — OXYCODONE HCL 5 MG PO TABS: 5.0000 mg | ORAL_TABLET | Freq: Once | ORAL | Status: DC | PRN

## 2011-12-12 SURGICAL SUPPLY — 39 items
APPLICATOR COTTON TIP 6IN STRL (MISCELLANEOUS) ×3 IMPLANT
BALL CTTN LRG ABS STRL LF (GAUZE/BANDAGES/DRESSINGS) ×2
CANISTER SUCTION 1200CC (MISCELLANEOUS) ×3 IMPLANT
CLOTH BEACON ORANGE TIMEOUT ST (SAFETY) ×3 IMPLANT
COAGULATOR SUCT SWTCH 10FR 6 (ELECTROSURGICAL) ×3 IMPLANT
CONT SPEC 4OZ CLIKSEAL STRL BL (MISCELLANEOUS) ×3 IMPLANT
COTTONBALL LRG STERILE PKG (GAUZE/BANDAGES/DRESSINGS) ×3 IMPLANT
COVER MAYO STAND STRL (DRAPES) ×3 IMPLANT
DECANTER SPIKE VIAL GLASS SM (MISCELLANEOUS) IMPLANT
DEPRESSOR TONGUE BLADE STERILE (MISCELLANEOUS) IMPLANT
DROPPER MEDICINE STER 1.5ML LF (MISCELLANEOUS) ×3 IMPLANT
DRSG TELFA 3X8 NADH (GAUZE/BANDAGES/DRESSINGS) IMPLANT
ELECT REM PT RETURN 9FT ADLT (ELECTROSURGICAL) ×3
ELECT REM PT RETURN 9FT PED (ELECTROSURGICAL)
ELECTRODE REM PT RETRN 9FT PED (ELECTROSURGICAL) IMPLANT
ELECTRODE REM PT RTRN 9FT ADLT (ELECTROSURGICAL) IMPLANT
GAUZE SPONGE 4X4 12PLY STRL LF (GAUZE/BANDAGES/DRESSINGS) ×3 IMPLANT
GLOVE BIO SURGEON STRL SZ8 (GLOVE) ×3 IMPLANT
GLOVE BIOGEL PI IND STRL 7.0 (GLOVE) IMPLANT
GLOVE BIOGEL PI INDICATOR 7.0 (GLOVE) ×1
GLOVE ECLIPSE 6.5 STRL STRAW (GLOVE) ×1 IMPLANT
GLOVE ECLIPSE 8.0 STRL XLNG CF (GLOVE) ×3 IMPLANT
GOWN PREVENTION PLUS XLARGE (GOWN DISPOSABLE) IMPLANT
NDL HYPO 25X1 1.5 SAFETY (NEEDLE) ×2 IMPLANT
NEEDLE HYPO 25X1 1.5 SAFETY (NEEDLE) ×3 IMPLANT
PACK BASIN DAY SURGERY FS (CUSTOM PROCEDURE TRAY) ×1 IMPLANT
PAD DRESSING TELFA 3X8 NADH (GAUZE/BANDAGES/DRESSINGS) IMPLANT
PATTIES SURGICAL .5 X3 (DISPOSABLE) ×1 IMPLANT
SHEET MEDIUM DRAPE 40X70 STRL (DRAPES) ×3 IMPLANT
SOLUTION BUTLER CLEAR DIP (MISCELLANEOUS) ×3 IMPLANT
SPONGE GAUZE 2X2 8PLY STRL LF (GAUZE/BANDAGES/DRESSINGS) IMPLANT
SYR BULB IRRIGATION 50ML (SYRINGE) ×3 IMPLANT
SYR CONTROL 10ML LL (SYRINGE) ×3 IMPLANT
TOWEL OR 17X24 6PK STRL BLUE (TOWEL DISPOSABLE) ×3 IMPLANT
TUBE CONNECTING 20X1/4 (TUBING) ×3 IMPLANT
TUBE EAR T MOD 1.32X4.8 BL (OTOLOGIC RELATED) ×2 IMPLANT
TUBE EAR VENT DONALDSON 1.14 (OTOLOGIC RELATED) ×4 IMPLANT
WATER STERILE IRR 1000ML POUR (IV SOLUTION) IMPLANT
YANKAUER SUCT BULB TIP NO VENT (SUCTIONS) ×3 IMPLANT

## 2011-12-12 NOTE — Progress Notes (Signed)
CPAP set up in Pt room w CPAP set at 10 per MD order. RN aware.

## 2011-12-12 NOTE — Anesthesia Procedure Notes (Signed)
Procedure Name: Intubation Date/Time: 12/12/2011 9:25 AM Performed by: Zenia Resides D Pre-anesthesia Checklist: Patient identified, Emergency Drugs available, Suction available and Patient being monitored Patient Re-evaluated:Patient Re-evaluated prior to inductionOxygen Delivery Method: Circle System Utilized Preoxygenation: Pre-oxygenation with 100% oxygen Intubation Type: IV induction Ventilation: Mask ventilation without difficulty Laryngoscope Size: Mac and 3 Grade View: Grade I Tube type: Oral Tube size: 8.0 mm Number of attempts: 1 Airway Equipment and Method: stylet and oral airway Placement Confirmation: ETT inserted through vocal cords under direct vision,  positive ETCO2,  breath sounds checked- equal and bilateral and CO2 detector Secured at: 24 cm Tube secured with: Tape Dental Injury: Teeth and Oropharynx as per pre-operative assessment

## 2011-12-12 NOTE — Op Note (Signed)
12/12/2011  9:54 AM    Lewayne Bunting  [id number]   Pre-Op Dx:  Chronic Eustachian Tube Dysfunction  Post-op Dx: same  Proc:Bilateral myringotomy with T-tubes,  Nasal endoscopy with cautery eustachian tori, bilateral.  Surg:  [name] T MD  Anes:  GOT  EBL:  None  Comp:  none  Findings:  Retracted TM's with dimeric areas and tympanosclerosis.  Mod thick fluid in the middle ear bilat.  Bulky lymphoid hypertrophy on the posterior lip of the eustachian torus bilat.  Procedure: With the patient in a comfortable supine position, general OT anesthesia was administered.  At an appropriate level, microscope and speculum were used to examine and clean the RIGHT ear canal.  The findings were as described above.  An anterior inferior radial myringotomy incision was sharply executed.  Middle ear contents were suctioned clear.  A modified T-tube tube was placed without difficulty.  Ciprodex otic solution was instilled into the external canal, and insufflated into the middle ear.  A cotton ball was placed at the external meatus. hemostasis was observed.  This side was completed.  After completing the RIGHT side, the LEFT side was done in identical fashion.    The patient received pre-op Afrin spray for decongestion and hemostasis.  The 0 degree nasal endoscope was inserted, beginning on the RIGHT side.  The findings were as described above.  Suction cautery at a 10 watt setting was used to ablate redundant lymphoid tissue on the torus.  After doing the RIGHT side, the LEFT side was done in the same way.   Following this  The patient was returned to anesthesia, awakened, and transferred to recovery in stable condition.  Dispo:  PACU to 23 observation for OSA.  CPAP use.  Plan: Routine drop use and water precautions.  Recheck my office three weeks.  Cephus Richer MD

## 2011-12-12 NOTE — Interval H&P Note (Signed)
History and Physical Interval Note:  12/12/2011 7:55 AM  Marc Mayo  has presented today for surgery, with the diagnosis of Chronic Eustachian Tube Dysfuction  The various methods of treatment have been discussed with the patient and family. After consideration of risks, benefits and other options for treatment, the patient has consented to  Procedure(s) (LRB) with comments: MYRINGOTOMY WITH TUBE PLACEMENT (Bilateral) - Bilateral T Tubes,  Direct Nasal Endoscopy with Cautery of Eustachian Tube Tori Bilateral NASAL ENDOSCOPY WITH EPISTAXIS CONTROL (Bilateral) as a surgical intervention .  The patient's history has been re-reviewed, patient re-examined, no change in status, stable for surgery.  I have re-reviewed the patient's chart and labs.  Questions were answered to the patient's satisfaction.     [name]

## 2011-12-12 NOTE — Transfer of Care (Signed)
Immediate Anesthesia Transfer of Care Note  Patient: Marc Mayo  Procedure(s) Performed: Procedure(s) (LRB) with comments: MYRINGOTOMY WITH TUBE PLACEMENT (Bilateral) - Bilateral T Tubes NASAL ENDOSCOPY WITH EPISTAXIS CONTROL (Bilateral) - Direct Nasal Endoscopy with Cautery of Eustachian Tube Tori, Bilateral  Patient Location: PACU  Anesthesia Type:General  Level of Consciousness: awake, alert  and oriented  Airway & Oxygen Therapy: Patient Spontanous Breathing and Patient connected to nasal cannula oxygen  Post-op Assessment: Report given to PACU RN and Post -op Vital signs reviewed and stable  Post vital signs: Reviewed and stable  Complications: No apparent anesthesia complications

## 2011-12-12 NOTE — Anesthesia Preprocedure Evaluation (Signed)
Anesthesia Evaluation  Patient identified by MRN, date of birth, ID band Patient awake    Reviewed: Allergy & Precautions, H&P , NPO status , Patient's Chart, lab work & pertinent test results  Airway Mallampati: I TM Distance: >3 FB Neck ROM: Full    Dental  (+) Teeth Intact and Dental Advisory Given   Pulmonary sleep apnea ,  breath sounds clear to auscultation        Cardiovascular Rhythm:Regular Rate:Normal     Neuro/Psych    GI/Hepatic GERD-  Medicated and Controlled,  Endo/Other    Renal/GU      Musculoskeletal   Abdominal   Peds  Hematology   Anesthesia Other Findings Pt tried to use CPAP at home with varying degrees of success and is in the process of changing the hardware he uses. For now, he will need to be admitted overnight and monitored for Sa02.  He understands.  Reproductive/Obstetrics                           Anesthesia Physical Anesthesia Plan  ASA: II  Anesthesia Plan: General   Post-op Pain Management:    Induction: Intravenous  Airway Management Planned: Oral ETT  Additional Equipment:   Intra-op Plan:   Post-operative Plan: Extubation in OR  Informed Consent: I have reviewed the patients History and Physical, chart, labs and discussed the procedure including the risks, benefits and alternatives for the proposed anesthesia with the patient or authorized representative who has indicated his/her understanding and acceptance.   Dental advisory given  Plan Discussed with: CRNA, Anesthesiologist and Surgeon  Anesthesia Plan Comments:         Anesthesia Quick Evaluation

## 2011-12-12 NOTE — Anesthesia Postprocedure Evaluation (Signed)
  Anesthesia Post-op Note  Patient: Marc Mayo  Procedure(s) Performed: Procedure(s) (LRB) with comments: MYRINGOTOMY WITH TUBE PLACEMENT (Bilateral) - Bilateral T Tubes NASAL ENDOSCOPY WITH EPISTAXIS CONTROL (Bilateral) - Direct Nasal Endoscopy with Cautery of Eustachian Tube Tori, Bilateral  Patient Location: PACU  Anesthesia Type:General  Level of Consciousness: awake, alert  and oriented  Airway and Oxygen Therapy: Patient Spontanous Breathing  Post-op Pain: mild  Post-op Assessment: Post-op Vital signs reviewed  Post-op Vital Signs: Reviewed  Complications: No apparent anesthesia complications

## 2011-12-13 ENCOUNTER — Encounter (HOSPITAL_BASED_OUTPATIENT_CLINIC_OR_DEPARTMENT_OTHER): Payer: Self-pay | Admitting: Otolaryngology

## 2011-12-13 LAB — POCT HEMOGLOBIN-HEMACUE: Hemoglobin: 17 g/dL (ref 13.0–17.0)

## 2011-12-19 ENCOUNTER — Encounter (HOSPITAL_BASED_OUTPATIENT_CLINIC_OR_DEPARTMENT_OTHER): Payer: Self-pay

## 2011-12-22 ENCOUNTER — Ambulatory Visit: Payer: Medicare Other | Admitting: Gastroenterology

## 2011-12-22 ENCOUNTER — Telehealth: Payer: Self-pay | Admitting: Gastroenterology

## 2011-12-22 NOTE — Telephone Encounter (Signed)
Pt was a no show

## 2012-01-02 DIAGNOSIS — M779 Enthesopathy, unspecified: Secondary | ICD-10-CM | POA: Diagnosis not present

## 2012-01-02 DIAGNOSIS — G894 Chronic pain syndrome: Secondary | ICD-10-CM | POA: Diagnosis not present

## 2012-01-02 DIAGNOSIS — M47817 Spondylosis without myelopathy or radiculopathy, lumbosacral region: Secondary | ICD-10-CM | POA: Diagnosis not present

## 2012-01-23 DIAGNOSIS — Z23 Encounter for immunization: Secondary | ICD-10-CM | POA: Diagnosis not present

## 2012-01-23 DIAGNOSIS — G43909 Migraine, unspecified, not intractable, without status migrainosus: Secondary | ICD-10-CM | POA: Diagnosis not present

## 2012-02-27 DIAGNOSIS — M47817 Spondylosis without myelopathy or radiculopathy, lumbosacral region: Secondary | ICD-10-CM | POA: Diagnosis not present

## 2012-02-27 DIAGNOSIS — G894 Chronic pain syndrome: Secondary | ICD-10-CM | POA: Diagnosis not present

## 2012-02-27 DIAGNOSIS — IMO0001 Reserved for inherently not codable concepts without codable children: Secondary | ICD-10-CM | POA: Diagnosis not present

## 2012-02-29 ENCOUNTER — Telehealth: Payer: Self-pay

## 2012-02-29 NOTE — Telephone Encounter (Signed)
Received refill request for omeprazole for this pt. Pt was due ov in 12/2011 for a 2 year follow up and pt no showed for that appt. Per AS- ok to refill omeprazole one time but pt needs ov for further refills. Faxed this information back to the pharmacy.

## 2012-03-16 ENCOUNTER — Encounter: Payer: Self-pay | Admitting: Gastroenterology

## 2012-03-16 NOTE — Telephone Encounter (Signed)
Mailed letter to patient to call our office to schedule OV to further his refills

## 2012-04-04 ENCOUNTER — Encounter: Payer: Self-pay | Admitting: Gastroenterology

## 2012-04-04 ENCOUNTER — Ambulatory Visit (INDEPENDENT_AMBULATORY_CARE_PROVIDER_SITE_OTHER): Payer: Medicare Other | Admitting: Gastroenterology

## 2012-04-04 VITALS — BP 142/88 | HR 95 | Temp 97.6°F | Ht 75.0 in | Wt 287.4 lb

## 2012-04-04 DIAGNOSIS — K219 Gastro-esophageal reflux disease without esophagitis: Secondary | ICD-10-CM

## 2012-04-04 DIAGNOSIS — K7689 Other specified diseases of liver: Secondary | ICD-10-CM | POA: Diagnosis not present

## 2012-04-04 DIAGNOSIS — K589 Irritable bowel syndrome without diarrhea: Secondary | ICD-10-CM

## 2012-04-04 DIAGNOSIS — K760 Fatty (change of) liver, not elsewhere classified: Secondary | ICD-10-CM

## 2012-04-04 MED ORDER — LINACLOTIDE 145 MCG PO CAPS: 145.0000 ug | ORAL_CAPSULE | Freq: Every day | ORAL | Status: DC

## 2012-04-04 MED ORDER — PERMETHRIN 5 % EX CREA: TOPICAL_CREAM | Freq: Once | CUTANEOUS | Status: DC

## 2012-04-04 MED ORDER — OMEPRAZOLE 20 MG PO CPDR: 20.0000 mg | DELAYED_RELEASE_CAPSULE | Freq: Every day | ORAL | Status: DC

## 2012-04-04 NOTE — Progress Notes (Signed)
Referring Provider: Joette Catching, MD Primary Care Physician:  Josue Hector, MD  Chief Complaint  Patient presents with  . Abdominal Pain    x 2 months  . Constipation    HPI:   Pleasant 27 year old male with history of GERD, IBS, and fatty liver presents today for further refills. Now dealing with constipation. Has been taking dulcolax. Has a BM twice a day. Will normally go once in morning, sometimes after supper. Sometimes productive, sometimes not. Abdominal discomfort, unsure if it is from IBS. Sometimes feels like it is "kicking". Better after BM. GERD controlled with Omeprazole.   Past Medical History  Diagnosis Date  . ADHD (attention deficit hyperactivity disorder)   . Bipolar affective disorder   . GERD (gastroesophageal reflux disease)   . Headache     migraines  . History of gastric ulcer   . Fatty liver   . Chronic back pain   . Chronic eustachian tube dysfunction 11/2011  . Sleep apnea     was taken off machine as breathing has gotten better  . IBS (irritable bowel syndrome)     Past Surgical History  Procedure Laterality Date  . Tympanostomy tube placement      x 7  . Myringotomy with tube placement  12/12/2011    Procedure: MYRINGOTOMY WITH TUBE PLACEMENT;  Surgeon: [name], MD;  Location: [facility];  Service: ENT;  Laterality: Bilateral;  Bilateral T Tubes  . Nasal endoscopy with epistaxis control  12/12/2011    Procedure: NASAL ENDOSCOPY WITH EPISTAXIS CONTROL;  Surgeon: [name], MD;  Location: [facility];  Service: ENT;  Laterality: Bilateral;  Direct Nasal Endoscopy with Cautery of Eustachian Tube Tori, Bilateral  . Esophagogastroduodenoscopy  2011    Dr. Darrick Penna: chronic non-H.pylori gastritis, normal small bowel biopsy     Current Outpatient Prescriptions  Medication Sig Dispense Refill  . ciprofloxacin-dexamethasone (CIPRODEX) otic suspension Place 4 drops into both ears 2 (two) times daily.  7.5  mL  3  . cloNIDine (CATAPRES) 0.1 MG tablet 2 (two) times daily.      Marland Kitchen omeprazole (PRILOSEC) 20 MG capsule Take 1 capsule (20 mg total) by mouth daily.  30 capsule  11  . oxyCODONE-acetaminophen (PERCOCET) 5-325 MG per tablet Take 1 tablet by mouth every 6 (six) hours as needed.       . rizatriptan (MAXALT-MLT) 10 MG disintegrating tablet Take 1 tablet (10 mg total) by mouth as needed for migraine. May repeat in 2 hours if needed  10 tablet  0  . Linaclotide (LINZESS) 145 MCG CAPS Take 1 capsule (145 mcg total) by mouth daily. 30 minutes before breakfast. Hold if loose stools.  30 capsule  5  . permethrin (ACTICIN) 5 % cream Apply topically once. Apply and leave on for at least 8 hours then rinse. Repeat in 1 week if necessary.  60 g  0   No current facility-administered medications for this visit.    Allergies as of 04/04/2012 - Review Complete 04/04/2012  Allergen Reaction Noted  . Alprazolam Other (See Comments) 12/06/2011  . Sertraline hcl Other (See Comments) 10/13/2010  . Ketorolac tromethamine Other (See Comments) 03/17/2008  . Lorazepam Other (See Comments) 03/17/2008  . Paroxetine hcl Other (See Comments) 10/13/2010  . Prozac (fluoxetine hcl) Other (See Comments) 10/13/2010    Family History  Problem Relation Age of Onset  . Colon cancer Neg Hx     History   Social History  . Marital Status: Single  Spouse Name: N/A    Number of Children: N/A  . Years of Education: N/A   Occupational History  . disability    Social History Main Topics  . Smoking status: Current Every Day Smoker -- 0.50 packs/day for 11 years    Types: Cigarettes  . Smokeless tobacco: Never Used  . Alcohol Use: No  . Drug Use: No  . Sexually Active: None   Other Topics Concern  . None   Social History Narrative  . None    Review of Systems: Negative unless mentioned above  Physical Exam: BP 142/88  Pulse 95  Temp(Src) 97.6 F (36.4 C) (Oral)  Ht 6\' 3"  (1.905 m)  Wt 287 lb 6.4 oz  (130.364 kg)  BMI 35.92 kg/m2 General:   Alert and oriented. No distress noted. Pleasant and cooperative.  Head:  Normocephalic and atraumatic. Eyes:  Conjuctiva clear without scleral icterus. Mouth:  Oral mucosa pink and moist. Good dentition. No lesions. Neck:  Supple, without mass or thyromegaly. Heart:  S1, S2 present without murmurs, rubs, or gallops. Regular rate and rhythm. Abdomen:  +BS, soft, non-tender and non-distended. No rebound or guarding. No HSM or masses noted. Msk:  Symmetrical without gross deformities. Normal posture. Extremities:  Without edema. Neurologic:  Alert and  oriented x4;  grossly normal neurologically. Skin:  Intact without significant lesions or rashes. Cervical Nodes:  No significant cervical adenopathy. Psych:  Alert and cooperative. Normal mood and affect.

## 2012-04-04 NOTE — Patient Instructions (Addendum)
Start taking Linzess each morning before breakfast. This is for IBS constipation. If you start having loose stools, cut back to every other day.   Please have blood work done.   We will see you back in 6 months.

## 2012-04-06 ENCOUNTER — Encounter: Payer: Self-pay | Admitting: Gastroenterology

## 2012-04-06 DIAGNOSIS — K760 Fatty (change of) liver, not elsewhere classified: Secondary | ICD-10-CM | POA: Insufficient documentation

## 2012-04-06 NOTE — Assessment & Plan Note (Signed)
Dealing with abdominal discomfort improved with defecation. No rectal bleeding. Using dulcolax currently. Trial of Linzess 145 mcg daily. Return in 6 months.

## 2012-04-06 NOTE — Assessment & Plan Note (Signed)
Controlled currently. Continue weight loss efforts, congratulated on progress thus far. Refills for Prilosec provided.

## 2012-04-06 NOTE — Assessment & Plan Note (Signed)
Check HFP.

## 2012-04-09 NOTE — Progress Notes (Signed)
Faxed to PCP

## 2012-04-11 DIAGNOSIS — K7689 Other specified diseases of liver: Secondary | ICD-10-CM | POA: Diagnosis not present

## 2012-04-11 LAB — HEPATIC FUNCTION PANEL
Albumin: 4.4 g/dL (ref 3.5–5.2)
Bilirubin, Direct: 0.1 mg/dL (ref 0.0–0.3)
Total Bilirubin: 0.5 mg/dL (ref 0.3–1.2)

## 2012-04-12 ENCOUNTER — Telehealth: Payer: Self-pay

## 2012-04-12 NOTE — Progress Notes (Signed)
Quick Note:  LFTs look great! Instructions for fatty liver: Recommend 1-2# weight loss per week until ideal body weight through exercise & diet. Low fat/cholesterol diet.  Avoid sweets, sodas, fruit juices, sweetened beverages like tea, etc. Gradually increase exercise from 15 min daily up to 1 hr per day 5 days/week. Limit alcohol use.  Repeat in 1 year ______

## 2012-04-12 NOTE — Telephone Encounter (Signed)
Pt called to check on liver tests. I told him Gerrit Halls, NP has not signed off, but everything looks normal. I will let her know that he called and see if he has any recommendations.

## 2012-04-13 NOTE — Progress Notes (Signed)
LMOM to call.

## 2012-04-13 NOTE — Telephone Encounter (Signed)
See result notes. LMOM to call.

## 2012-04-16 NOTE — Progress Notes (Signed)
Quick Note:  Called and informed pt and mailed diets. ______

## 2012-04-23 DIAGNOSIS — M47817 Spondylosis without myelopathy or radiculopathy, lumbosacral region: Secondary | ICD-10-CM | POA: Diagnosis not present

## 2012-04-23 DIAGNOSIS — G894 Chronic pain syndrome: Secondary | ICD-10-CM | POA: Diagnosis not present

## 2012-05-01 ENCOUNTER — Ambulatory Visit (INDEPENDENT_AMBULATORY_CARE_PROVIDER_SITE_OTHER)
Admission: RE | Admit: 2012-05-01 | Discharge: 2012-05-01 | Disposition: A | Discharge status: Home / Self Care | Payer: Medicare Other | Source: Ambulatory Visit | Attending: Internal Medicine | Admitting: Internal Medicine

## 2012-05-01 ENCOUNTER — Telehealth: Payer: Self-pay | Admitting: Internal Medicine

## 2012-05-01 ENCOUNTER — Encounter: Payer: Self-pay | Admitting: Internal Medicine

## 2012-05-01 ENCOUNTER — Ambulatory Visit (INDEPENDENT_AMBULATORY_CARE_PROVIDER_SITE_OTHER): Payer: Medicare Other | Admitting: Internal Medicine

## 2012-05-01 VITALS — BP 130/80 | HR 90 | Temp 98.1°F | Ht 73.0 in | Wt 287.8 lb

## 2012-05-01 DIAGNOSIS — F17200 Nicotine dependence, unspecified, uncomplicated: Secondary | ICD-10-CM | POA: Diagnosis not present

## 2012-05-01 DIAGNOSIS — R0602 Shortness of breath: Secondary | ICD-10-CM | POA: Diagnosis not present

## 2012-05-01 DIAGNOSIS — J45909 Unspecified asthma, uncomplicated: Secondary | ICD-10-CM

## 2012-05-01 DIAGNOSIS — R05 Cough: Secondary | ICD-10-CM | POA: Diagnosis not present

## 2012-05-01 DIAGNOSIS — R0600 Dyspnea, unspecified: Secondary | ICD-10-CM | POA: Insufficient documentation

## 2012-05-01 MED ORDER — AZITHROMYCIN 250 MG PO TABS: ORAL_TABLET | ORAL | Status: DC

## 2012-05-01 MED ORDER — BUDESONIDE-FORMOTEROL FUMARATE 160-4.5 MCG/ACT IN AERO: INHALATION_SPRAY | RESPIRATORY_TRACT | Status: DC

## 2012-05-01 MED ORDER — PREDNISONE (PAK) 10 MG PO TABS: ORAL_TABLET | ORAL | Status: DC

## 2012-05-01 NOTE — Telephone Encounter (Signed)
I spoke with patient about results and he verbalized understanding and had no questions 

## 2012-05-01 NOTE — Telephone Encounter (Signed)
cxr is wnl

## 2012-05-01 NOTE — Patient Instructions (Addendum)
The key is to stop smoking completely before smoking completely stops you - this is the most important aspect of your care  zpak and prednisone called in already  Symbicort 160 Take 2 puffs first thing in am and then another 2 puffs about 12 hours later.    Work on inhaler technique:  relax and gently blow all the way out then take a nice smooth deep breath back in, triggering the inhaler at same time you start breathing in.  Hold for up to 5 seconds if you can.  Rinse and gargle with water when done   If your mouth or throat starts to bother you,   I suggest you time the inhaler to your dental care and after using the inhaler(s) brush teeth and tongue with a baking soda containing toothpaste and when you rinse this out, gargle with it first to see if this helps your mouth and throat.     Please remember to go to the x-ray department downstairs for your tests - we will call you with the results when they are available.  Please schedule a follow up office visit in 6 weeks, call sooner if needed with pfts

## 2012-05-01 NOTE — Progress Notes (Signed)
  Subjective:    Patient ID: Marc Mayo, male    DOB: 08/28/1985 MRN: [id number]  HPI  26 yowm smoke on inhalers prn x since around 2000 referred 05/01/2012  to pulmonary clinic for refractory cough by Dr Joyce Copa office.  Has seen Dr Vassie Loll for sleep with nl spirometry in 2013   05/01/2012 1st [facility] eval  Chief Complaint  Patient presents with  . Pulmonary Consult    for cough x 2-3 weeks.  Cough prod dark mucus, worse at hs and while trying to sleep. Stays sob all the time but worse x 5 steps coming on x 3-4 years  But much worse since onset of cough. No chest pain  No obvious daytime variabilty or assoc chronic cough or cp or chest tightness, subjective wheeze overt sinus or hb symptoms. No unusual exp hx or h/o childhood pna/ asthma or premature birth to his knowledge.   Sleeping ok without nocturnal  or early am exacerbation  of respiratory  c/o's or need for noct saba. Also denies any obvious fluctuation of symptoms with weather or environmental changes or other aggravating or alleviating factors except as outlined above    Review of Systems  Constitutional: Negative for fever and unexpected weight change.  HENT: Negative for ear pain, nosebleeds, congestion, sore throat, rhinorrhea, sneezing, trouble swallowing, dental problem, postnasal drip and sinus pressure.   Eyes: Negative for redness and itching.  Respiratory: Positive for cough and shortness of breath. Negative for chest tightness and wheezing.   Cardiovascular: Negative for palpitations and leg swelling.  Gastrointestinal: Negative for nausea and vomiting.  Genitourinary: Negative for dysuria.  Musculoskeletal: Negative for joint swelling.  Skin: Negative for rash.  Neurological: Positive for headaches.  Hematological: Does not bruise/bleed easily.  Psychiatric/Behavioral: Negative for dysphoric mood. The patient is not nervous/anxious.        Objective:   Physical Exam  amb wm  Wt Readings from Last 3  Encounters:  05/01/12 287 lb 12.8 oz (130.545 kg)  04/04/12 287 lb 6.4 oz (130.364 kg)  12/12/11 281 lb (127.461 kg)    HEENT: nl dentition, turbinates, and orophanx. Nl external ear canals without cough reflex   NECK :  without JVD/Nodes/TM/ nl carotid upstrokes bilaterally   LUNGS: no acc muscle use, clear to A and P bilaterally without cough on insp or exp maneuvers   CV:  RRR  no s3 or murmur or increase in P2, no edema   ABD:  soft and nontender with nl excursion in the supine position. No bruits or organomegaly, bowel sounds nl  MS:  warm without deformities, calf tenderness, cyanosis or clubbing  SKIN: warm and dry without lesions    NEURO:  alert, approp, no deficits      CXR  05/01/2012 :  No abnormality noted.      Assessment & Plan:

## 2012-05-01 NOTE — Telephone Encounter (Signed)
I spoke with pt and he is requesting his CXR results from today. Please advise MW thanks

## 2012-05-03 ENCOUNTER — Other Ambulatory Visit: Payer: Self-pay

## 2012-05-03 NOTE — Telephone Encounter (Signed)
Please let the pharmacy no that we will not be refilling this medication. This is the second request that I have denied.  Patient should see his PCP if this med is needed.

## 2012-05-04 NOTE — Assessment & Plan Note (Addendum)
DDX of  difficult airways managment all start with A and  include Adherence, Ace Inhibitors, Acid Reflux, Active Sinus Disease, Alpha 1 Antitripsin deficiency, Anxiety masquerading as Airways dz,  ABPA,  allergy(esp in young), Aspiration (esp in elderly), Adverse effects of DPI,  Active smokers, plus two Bs  = Bronchiectasis and Beta blocker use..and one C= CHF   In this case Adherence is the biggest issue and starts with  inability to use HFA effectively and also  understand that SABA treats the symptoms but doesn't get to the underlying problem (inflammation).  I used  the analogy of putting steroid cream on a rash to help explain the meaning of topical therapy and the need to get the drug to the target tissue.    The proper method of use, as well as anticipated side effects, of a metered-dose inhaler are discussed and demonstrated to the patient. Improved effectiveness after extensive coaching during this visit to a level of approximately  75% so try symbicort 160 2bid  Active smoking huge concern > discussed separately  Anxiety > note MD disorder so this is much higher on the ddx list  See instructions for specific recommendations which were reviewed directly with the patient who was given a copy with highlighter outlining the key components.  If abn pft's need alpha one at screening

## 2012-05-04 NOTE — Assessment & Plan Note (Signed)

## 2012-05-18 DIAGNOSIS — H6980 Other specified disorders of Eustachian tube, unspecified ear: Secondary | ICD-10-CM | POA: Diagnosis not present

## 2012-05-18 DIAGNOSIS — G4733 Obstructive sleep apnea (adult) (pediatric): Secondary | ICD-10-CM | POA: Diagnosis not present

## 2012-05-18 DIAGNOSIS — H6990 Unspecified Eustachian tube disorder, unspecified ear: Secondary | ICD-10-CM | POA: Diagnosis not present

## 2012-05-24 ENCOUNTER — Telehealth: Payer: Self-pay | Admitting: Pulmonary Disease

## 2012-05-24 NOTE — Telephone Encounter (Signed)
Got Dr Raye Sorrow note on this pt. Pl let their office know that we do offer home sleep studies & can arrange for him to get one

## 2012-05-25 NOTE — Telephone Encounter (Signed)
I spoke with Marc Mayo and she will let Dr. Lazarus Salines know. She stated if he still wants this she will call and let us know. Will sign off message

## 2012-05-28 DIAGNOSIS — G894 Chronic pain syndrome: Secondary | ICD-10-CM | POA: Diagnosis not present

## 2012-05-28 DIAGNOSIS — M47817 Spondylosis without myelopathy or radiculopathy, lumbosacral region: Secondary | ICD-10-CM | POA: Diagnosis not present

## 2012-05-28 DIAGNOSIS — F319 Bipolar disorder, unspecified: Secondary | ICD-10-CM | POA: Diagnosis not present

## 2012-05-29 ENCOUNTER — Telehealth: Payer: Self-pay | Admitting: Pulmonary Disease

## 2012-05-29 NOTE — Telephone Encounter (Signed)
Received 4 pages from [facility], sent to Dr. Vassie Loll. 05/29/12/ss

## 2012-06-18 ENCOUNTER — Ambulatory Visit (INDEPENDENT_AMBULATORY_CARE_PROVIDER_SITE_OTHER): Payer: Medicaid Other | Admitting: Internal Medicine

## 2012-06-18 ENCOUNTER — Telehealth: Payer: Self-pay | Admitting: Pulmonary Disease

## 2012-06-18 ENCOUNTER — Ambulatory Visit (INDEPENDENT_AMBULATORY_CARE_PROVIDER_SITE_OTHER): Payer: Medicare Other | Admitting: Internal Medicine

## 2012-06-18 ENCOUNTER — Encounter: Payer: Self-pay | Admitting: Internal Medicine

## 2012-06-18 VITALS — BP 124/76 | HR 88 | Temp 97.8°F | Ht 72.0 in | Wt 283.0 lb

## 2012-06-18 DIAGNOSIS — R05 Cough: Secondary | ICD-10-CM | POA: Diagnosis not present

## 2012-06-18 DIAGNOSIS — R079 Chest pain, unspecified: Secondary | ICD-10-CM

## 2012-06-18 DIAGNOSIS — J45909 Unspecified asthma, uncomplicated: Secondary | ICD-10-CM | POA: Diagnosis not present

## 2012-06-18 DIAGNOSIS — J4520 Mild intermittent asthma, uncomplicated: Secondary | ICD-10-CM

## 2012-06-18 DIAGNOSIS — R0602 Shortness of breath: Secondary | ICD-10-CM | POA: Diagnosis not present

## 2012-06-18 DIAGNOSIS — F17200 Nicotine dependence, unspecified, uncomplicated: Secondary | ICD-10-CM

## 2012-06-18 LAB — PULMONARY FUNCTION TEST

## 2012-06-18 MED ORDER — OMEPRAZOLE 20 MG PO CPDR: DELAYED_RELEASE_CAPSULE | ORAL | Status: DC

## 2012-06-18 MED ORDER — FAMOTIDINE 20 MG PO TABS: ORAL_TABLET | ORAL | Status: DC

## 2012-06-18 MED ORDER — BUDESONIDE-FORMOTEROL FUMARATE 160-4.5 MCG/ACT IN AERO: INHALATION_SPRAY | RESPIRATORY_TRACT | Status: DC

## 2012-06-18 NOTE — Patient Instructions (Addendum)
Omeprazole 20 mg Take 30- 60 min before your first and last meals of the day and pepcid 20 mg one bedtime   For breathing ok to restart symbicort 160 Take 2 puffs first thing in am and then another 2 puffs about 12 hours later.   For cough mucinex dm 1200 mg every 12 hours and don't smoke  GERD (REFLUX)  is an extremely common cause of respiratory symptoms, many times with no significant heartburn at all.    It can be treated with medication, but also with lifestyle changes including avoidance of late meals, excessive alcohol, smoking cessation, and avoid fatty foods, chocolate, peppermint, colas, red wine, and acidic juices such as orange juice.  NO MINT OR MENTHOL PRODUCTS SO NO COUGH DROPS  USE SUGARLESS CANDY INSTEAD (jolley ranchers or Stover's)  NO OIL BASED VITAMINS - use powdered substitutes.    Please schedule a follow up office visit in 4 weeks, sooner if needed

## 2012-06-18 NOTE — Assessment & Plan Note (Addendum)
-   hfa 75% p coaching 05/01/2012  > 90%  06/18/2012  - 06/18/2012  PFt's FEV1  4.45 (92%) ratio 91 and DLCO 76% corrects to 94% - 06/18/2012  Walked RA x 3 laps @ 185 ft each stopped due to  End of study, no desats, no ekg changes  Symptoms are markedly disproportionate to objective findings and not clear this is even a lung problem but pt does appear to have difficult airway management issues.   DDX of  difficult airways managment all start with A and  include Adherence, Ace Inhibitors, Acid Reflux, Active Sinus Disease, Alpha 1 Antitripsin deficiency, Anxiety masquerading as Airways dz,  ABPA,  allergy(esp in young), Aspiration (esp in elderly), Adverse effects of DPI,  Active smokers, plus two Bs  = Bronchiectasis and Beta blocker use..and one C= CHF   Adherence is always the initial "prime suspect" and is a multilayered concern that requires a "trust but verify" approach in every patient - starting with knowing how to use medications, especially inhalers, correctly, keeping up with refills and understanding the fundamental difference between maintenance and prns vs those medications only taken for a very short course and then stopped and not refilled. The proper method of use, as well as anticipated side effects, of a metered-dose inhaler are discussed and demonstrated to the patient. Improved effectiveness after extensive coaching during this visit to a level of approximately  90% so rec change the symbicort to prn as not helping cough or breathing apparently.  ? Acid reflux > try max rx x one month (See instructions for specific recommendations which were reviewed directly with the patient who was given a copy with highlighter outlining the key components.)  ? Anxiety > note not able to reproduces sob x 10 ft in office setting

## 2012-06-18 NOTE — Assessment & Plan Note (Signed)
Of the three most common causes of chronic cough, only one (GERD)  can actually cause the other two (asthma and post nasal drip syndrome)  and perpetuate the cylce of cough inducing airway trauma, inflammation, heightened sensitivity to reflux which is prompted by the cough itself via a cyclical mechanism.    This may partially respond to steroids and look like asthma and post nasal drainage but never erradicated completely unless the cough and the secondary reflux are eliminated, preferably both at the same time.  While not intuitively obvious, many patients with chronic low grade reflux do not cough until there is a secondary insult that disturbs the protective epithelial barrier and exposes sensitive nerve endings.  This can be viral or direct physical injury such as with an endotracheal tube.   The point is that once this occurs, it is difficult to eliminate using anything but a maximally effective acid suppression regimen at least in the short run, accompanied by an appropriate diet to address non acid GERD.  

## 2012-06-18 NOTE — Progress Notes (Signed)
PFT done today. 

## 2012-06-18 NOTE — Progress Notes (Signed)
  Subjective:    Patient ID: Marc Mayo, male    DOB: 1985/09/12 MRN: [id number]  HPI  26 yowm smoke on inhalers prn x since around 2000 referred 05/01/2012  to pulmonary clinic for refractory cough by Dr Joyce Copa office.  Has seen Dr Vassie Loll for sleep with nl spirometry in 2013   05/01/2012 1st [facility] eval  Chief Complaint  Patient presents with  . Pulmonary Consult    for cough x 2-3 weeks.  Cough prod dark mucus, worse at hs and while trying to sleep. Stays sob all the time but worse x 5 steps coming on x 3-4 years  But much worse since onset of cough. No chest pain The key is to stop smoking completely before smoking completely stops you - this is the most important aspect of your care zpak and prednisone called in already Symbicort 160 Take 2 puffs first thing in am and then another 2 puffs about 12 hours later.  Work on inhaler technique     06/18/2012 f/u ov/Curtistine Pettitt cc cough no better on symbicort taking omeprazole randomly  Chief Complaint  Patient presents with  . Follow-up    Pt states cough is the same, no better or worse since the last visit.   cough tends to be worse after 8 at night and extends through the night and in am and now clear in color, min mucoid sputum  No obvious daytime variabilty or assoc  cp or chest tightness, subjective wheeze overt sinus or hb symptoms. No unusual exp hx or h/o childhood pna/ asthma or premature birth to his knowledge.     Also denies any obvious fluctuation of symptoms with weather or environmental changes or other aggravating or alleviating factors except as outlined above   Current Medications, Allergies, Past Medical History, Past Surgical History, Family History, and Social History were reviewed in [employer] record.  ROS  The following are not active complaints unless bolded sore throat, dysphagia, dental problems, itching, sneezing,  nasal congestion or excess/ purulent secretions, ear ache,   fever, chills,  sweats, unintended wt loss, pleuritic or exertional cp, hemoptysis,  orthopnea pnd or leg swelling, presyncope, palpitations, heartburn, abdominal pain, anorexia, nausea, vomiting, diarrhea  or change in bowel or urinary habits, change in stools or urine, dysuria,hematuria,  rash, arthralgias, visual complaints, headache, numbness weakness or ataxia or problems with walking or coordination,  change in mood/affect or memory.         .        Objective:   Physical Exam  amb wm very gruff  voice Wt  283  06/18/2012  Wt Readings from Last 3 Encounters:  05/01/12 287 lb 12.8 oz (130.545 kg)  04/04/12 287 lb 6.4 oz (130.364 kg)  12/12/11 281 lb (127.461 kg)    HEENT: nl dentition, turbinates, and orophanx. Nl external ear canals without cough reflex   NECK :  without JVD/Nodes/TM/ nl carotid upstrokes bilaterally   LUNGS: no acc muscle use, clear to A and P bilaterally without cough on insp or exp maneuvers   CV:  RRR  no s3 or murmur or increase in P2, no edema   ABD:  soft and nontender with nl excursion in the supine position. No bruits or organomegaly, bowel sounds nl  MS:  warm without deformities, calf tenderness, cyanosis or clubbing         CXR  05/01/2012 :  No abnormality noted.      Assessment & Plan:

## 2012-06-18 NOTE — Telephone Encounter (Signed)
Pl let her know - he had in lab study that showed mod OSA 30/h Pl schedule OV to evaluate

## 2012-06-18 NOTE — Assessment & Plan Note (Signed)
>   3 min I reviewed the Flethcher curve with patient that basically indicates  if you quit smoking when your best day FEV1 is still well preserved (as his clearly is)  it is highly unlikely you will progress to severe disease and informed the patient there was no medication on the market that has proven to change the curve or the likelihood of progression.  Therefore stopping smoking and maintaining abstinence is the most important aspect of care, not choice of inhalers or for that matter, doctors.

## 2012-06-18 NOTE — Telephone Encounter (Signed)
I spoke with Amy. She stated Dr. Ok Anis would like for pt to have home sleep study done (passed off of 05/24/12 phone note RA had advised we could do this for pt). They would like to go ahead to get this set up. Dr. Vassie Loll please advise if okay to go ahead and order. Thanks  --good contact # for pt is [phone].

## 2012-06-19 NOTE — Telephone Encounter (Signed)
ATC pt. He answered the phone. Once I asked to speak with pt he hung up the phone on me. I called back and no answer. WCB.

## 2012-06-20 ENCOUNTER — Telehealth: Payer: Self-pay | Admitting: Pulmonary Disease

## 2012-06-20 NOTE — Telephone Encounter (Signed)
lmomtcb x1 for pt 

## 2012-06-20 NOTE — Telephone Encounter (Signed)
Duplicate message see phone note 06/18/12

## 2012-06-20 NOTE — Telephone Encounter (Signed)
i spoke with pt and OV scheduled.  i called Marc Mayo but office closed wcb in am

## 2012-06-22 NOTE — Telephone Encounter (Signed)
I spoke with nurse at Dr. Raye Sorrow office and advised that pt has study and has f/u appt to address. Carron Curie, CMA

## 2012-06-25 DIAGNOSIS — F319 Bipolar disorder, unspecified: Secondary | ICD-10-CM | POA: Diagnosis not present

## 2012-06-25 DIAGNOSIS — G894 Chronic pain syndrome: Secondary | ICD-10-CM | POA: Diagnosis not present

## 2012-06-25 DIAGNOSIS — M47817 Spondylosis without myelopathy or radiculopathy, lumbosacral region: Secondary | ICD-10-CM | POA: Diagnosis not present

## 2012-06-25 DIAGNOSIS — Z79899 Other long term (current) drug therapy: Secondary | ICD-10-CM | POA: Diagnosis not present

## 2012-06-27 ENCOUNTER — Encounter: Payer: Self-pay | Admitting: Internal Medicine

## 2012-07-07 NOTE — Progress Notes (Signed)
REVIEWED.  

## 2012-07-12 ENCOUNTER — Ambulatory Visit: Payer: Medicare Other | Admitting: Pulmonary Disease

## 2012-07-16 ENCOUNTER — Ambulatory Visit: Payer: Medicare Other | Admitting: Internal Medicine

## 2012-07-17 ENCOUNTER — Ambulatory Visit: Payer: Medicare Other | Admitting: Pulmonary Disease

## 2012-07-24 DIAGNOSIS — M62838 Other muscle spasm: Secondary | ICD-10-CM | POA: Diagnosis not present

## 2012-07-24 DIAGNOSIS — G43909 Migraine, unspecified, not intractable, without status migrainosus: Secondary | ICD-10-CM | POA: Diagnosis not present

## 2012-07-25 DIAGNOSIS — G894 Chronic pain syndrome: Secondary | ICD-10-CM | POA: Diagnosis not present

## 2012-07-25 DIAGNOSIS — M47817 Spondylosis without myelopathy or radiculopathy, lumbosacral region: Secondary | ICD-10-CM | POA: Diagnosis not present

## 2012-07-27 ENCOUNTER — Emergency Department (HOSPITAL_BASED_OUTPATIENT_CLINIC_OR_DEPARTMENT_OTHER)
Admission: EM | Admit: 2012-07-27 | Discharge: 2012-07-27 | Disposition: A | Discharge status: Home / Self Care | Payer: Medicare Other | Attending: Emergency Medicine | Admitting: Emergency Medicine

## 2012-07-27 ENCOUNTER — Encounter (HOSPITAL_BASED_OUTPATIENT_CLINIC_OR_DEPARTMENT_OTHER): Payer: Self-pay | Admitting: *Deleted

## 2012-07-27 DIAGNOSIS — Z8719 Personal history of other diseases of the digestive system: Secondary | ICD-10-CM | POA: Diagnosis not present

## 2012-07-27 DIAGNOSIS — M549 Dorsalgia, unspecified: Secondary | ICD-10-CM

## 2012-07-27 DIAGNOSIS — K259 Gastric ulcer, unspecified as acute or chronic, without hemorrhage or perforation: Secondary | ICD-10-CM | POA: Diagnosis not present

## 2012-07-27 DIAGNOSIS — Z8669 Personal history of other diseases of the nervous system and sense organs: Secondary | ICD-10-CM | POA: Diagnosis not present

## 2012-07-27 DIAGNOSIS — F988 Other specified behavioral and emotional disorders with onset usually occurring in childhood and adolescence: Secondary | ICD-10-CM | POA: Insufficient documentation

## 2012-07-27 DIAGNOSIS — G8929 Other chronic pain: Secondary | ICD-10-CM | POA: Diagnosis not present

## 2012-07-27 DIAGNOSIS — F319 Bipolar disorder, unspecified: Secondary | ICD-10-CM | POA: Diagnosis not present

## 2012-07-27 DIAGNOSIS — K219 Gastro-esophageal reflux disease without esophagitis: Secondary | ICD-10-CM | POA: Diagnosis not present

## 2012-07-27 DIAGNOSIS — R269 Unspecified abnormalities of gait and mobility: Secondary | ICD-10-CM | POA: Diagnosis not present

## 2012-07-27 DIAGNOSIS — F17200 Nicotine dependence, unspecified, uncomplicated: Secondary | ICD-10-CM | POA: Insufficient documentation

## 2012-07-27 DIAGNOSIS — M5430 Sciatica, unspecified side: Secondary | ICD-10-CM | POA: Diagnosis not present

## 2012-07-27 DIAGNOSIS — I10 Essential (primary) hypertension: Secondary | ICD-10-CM | POA: Diagnosis not present

## 2012-07-27 DIAGNOSIS — M5431 Sciatica, right side: Secondary | ICD-10-CM

## 2012-07-27 MED ORDER — PROMETHAZINE HCL 25 MG PO TABS: 25.0000 mg | ORAL_TABLET | Freq: Four times a day (QID) | ORAL | Status: DC | PRN

## 2012-07-27 MED ORDER — PREDNISONE 20 MG PO TABS: ORAL_TABLET | ORAL | Status: DC

## 2012-07-27 MED ORDER — HYDROCODONE-ACETAMINOPHEN 5-325 MG PO TABS: 2.0000 | ORAL_TABLET | Freq: Four times a day (QID) | ORAL | Status: DC | PRN

## 2012-07-27 MED ORDER — HYDROCODONE-ACETAMINOPHEN 5-325 MG PO TABS
2.0000 | ORAL_TABLET | Freq: Once | ORAL | Status: AC
  Administered 2012-07-27: 2 via ORAL
  Filled 2012-07-27: qty 2

## 2012-07-27 MED ORDER — METHOCARBAMOL 500 MG PO TABS: 500.0000 mg | ORAL_TABLET | Freq: Two times a day (BID) | ORAL | Status: DC

## 2012-07-27 MED ORDER — ONDANSETRON 8 MG PO TBDP
8.0000 mg | ORAL_TABLET | Freq: Once | ORAL | Status: AC
  Administered 2012-07-27: 8 mg via ORAL
  Filled 2012-07-27: qty 1

## 2012-07-27 NOTE — ED Provider Notes (Signed)
History    CSN: [id number] Arrival date & time 07/27/12  1341  First MD Initiated Contact with Patient 07/27/12 1348     Chief Complaint  Patient presents with  . Back Pain   (Consider location/radiation/quality/duration/timing/severity/associated sxs/prior Treatment) HPI Comments: Is a 27 year old male with history of hypertension who presents today with 4 days of worsening low back pain. He did not recall any injury or trauma to his back. He describes the pain as a stabbing pain in his right low back with radiation into his right leg. He can walk, but it is painful. Walking and palpation makes the pain worse. Rest it makes the pain better. He has not tried any medication for the pain. He has never had pain like this in the past. No IV drug abuse, history of cancer, bowel or bladder incontinence. No fevers, chills, nausea, vomiting, abdominal pain, numbness, weakness, paresthesias. He has an appointment with his PCP next week.   Past Medical History  Diagnosis Date  . ADHD (attention deficit hyperactivity disorder)   . Bipolar affective disorder   . GERD (gastroesophageal reflux disease)   . Headache(784.0)     migraines  . History of gastric ulcer   . Fatty liver   . Chronic back pain   . Chronic eustachian tube dysfunction 11/2011  . Sleep apnea     was taken off machine as breathing has gotten better  . IBS (irritable bowel syndrome)    Past Surgical History  Procedure Laterality Date  . Tympanostomy tube placement      x 7  . Myringotomy with tube placement  12/12/2011    Procedure: MYRINGOTOMY WITH TUBE PLACEMENT;  Surgeon: [name], MD;  Location: [facility];  Service: ENT;  Laterality: Bilateral;  Bilateral T Tubes  . Nasal endoscopy with epistaxis control  12/12/2011    Procedure: NASAL ENDOSCOPY WITH EPISTAXIS CONTROL;  Surgeon: [name], MD;  Location: [facility];  Service: ENT;  Laterality: Bilateral;  Direct Nasal Endoscopy  with Cautery of Eustachian Tube Tori, Bilateral  . Esophagogastroduodenoscopy  2011    Dr. Darrick Penna: chronic non-H.pylori gastritis, normal small bowel biopsy    Family History  Problem Relation Age of Onset  . Colon cancer Neg Hx    History  Substance Use Topics  . Smoking status: Current Every Day Smoker -- 0.50 packs/day for 11 years    Types: Cigarettes  . Smokeless tobacco: Never Used  . Alcohol Use: No    Review of Systems  Constitutional: Negative for fever and chills.  Gastrointestinal: Negative for nausea, vomiting and abdominal pain.  Genitourinary: Negative for difficulty urinating.  Musculoskeletal: Positive for back pain and gait problem.  Neurological: Negative for weakness and numbness.  All other systems reviewed and are negative.    Allergies  Alprazolam; Sertraline hcl; Celexa; Motrin; Tramadol; Ketorolac tromethamine; Lorazepam; Paroxetine hcl; and Prozac  Home Medications   Current Outpatient Rx  Name  Route  Sig  Dispense  Refill  . budesonide-formoterol (SYMBICORT) 160-4.5 MCG/ACT inhaler      Take 2 puffs first thing in am and then another 2 puffs about 12 hours later.   1 Inhaler   11   . cloNIDine (CATAPRES) 0.1 MG tablet      2 (two) times daily.         Marland Kitchen omeprazole (PRILOSEC) 20 MG capsule      Take 30- 60 min before your first and last meals of the day  60 capsule   11    BP 151/100  Pulse 93  Temp(Src) 97.9 F (36.6 C) (Oral)  Resp 18  Ht 6\' 3"  (1.905 m)  Wt 279 lb (126.554 kg)  BMI 34.87 kg/m2  SpO2 100% Physical Exam  Nursing note and vitals reviewed. Constitutional: He is oriented to person, place, and time. He appears well-developed and well-nourished. No distress.  HENT:  Head: Normocephalic and atraumatic.  Right Ear: External ear normal.  Left Ear: External ear normal.  Nose: Nose normal.  Eyes: Conjunctivae are normal.  Neck: Normal range of motion. No tracheal deviation present.  Cardiovascular: Normal rate,  regular rhythm, normal heart sounds, intact distal pulses and normal pulses.   Pulmonary/Chest: Effort normal and breath sounds normal. No stridor.  Abdominal: Soft. He exhibits no distension. There is no tenderness.  Musculoskeletal: Normal range of motion.  No deformities, step-off Tender to palpation over the right SI joint  Neurological: He is alert and oriented to person, place, and time. He has normal strength. No sensory deficit. He exhibits normal muscle tone. Coordination normal.  Skin: Skin is warm and dry. He is not diaphoretic.  Psychiatric: He has a normal mood and affect. His behavior is normal.    ED Course  Procedures (including critical care time) Labs Reviewed  URINALYSIS, ROUTINE W REFLEX MICROSCOPIC   No results found. 1. Back pain   2. Sciatica, right     MDM  Patient with back pain.  No neurological deficits and normal neuro exam.  Patient can walk but states is painful.  No loss of bowel or bladder control.  No concern for cauda equina.  No fever, night sweats, weight loss, h/o cancer, IVDU.  RICE protocol and pain medicine indicated and discussed with patient.    Mora Bellman, PA-C 07/27/12 1418

## 2012-07-27 NOTE — ED Notes (Signed)
Pt c/o lower back pain x 4 days 

## 2012-07-29 NOTE — ED Provider Notes (Signed)
Medical screening examination/treatment/procedure(s) were performed by non-physician practitioner and as supervising physician I was immediately available for consultation/collaboration.   Lashawndra Lampkins B. Bernette Mayers, MD 07/29/12 1946

## 2012-07-31 DIAGNOSIS — M5430 Sciatica, unspecified side: Secondary | ICD-10-CM | POA: Diagnosis not present

## 2012-08-28 ENCOUNTER — Encounter: Payer: Self-pay | Admitting: Gastroenterology

## 2012-08-28 ENCOUNTER — Other Ambulatory Visit: Payer: Self-pay

## 2012-08-28 MED ORDER — LINACLOTIDE 145 MCG PO CAPS: 145.0000 ug | ORAL_CAPSULE | Freq: Every day | ORAL | Status: DC

## 2012-09-03 DIAGNOSIS — M549 Dorsalgia, unspecified: Secondary | ICD-10-CM | POA: Diagnosis not present

## 2012-09-03 DIAGNOSIS — M5430 Sciatica, unspecified side: Secondary | ICD-10-CM | POA: Diagnosis not present

## 2012-09-06 DIAGNOSIS — D1800 Hemangioma unspecified site: Secondary | ICD-10-CM | POA: Diagnosis not present

## 2012-09-06 DIAGNOSIS — M5137 Other intervertebral disc degeneration, lumbosacral region: Secondary | ICD-10-CM | POA: Diagnosis not present

## 2012-10-05 DIAGNOSIS — F411 Generalized anxiety disorder: Secondary | ICD-10-CM | POA: Diagnosis not present

## 2012-10-05 DIAGNOSIS — Z23 Encounter for immunization: Secondary | ICD-10-CM | POA: Diagnosis not present

## 2012-10-12 ENCOUNTER — Encounter (HOSPITAL_BASED_OUTPATIENT_CLINIC_OR_DEPARTMENT_OTHER): Payer: Self-pay | Admitting: *Deleted

## 2012-10-12 ENCOUNTER — Emergency Department (HOSPITAL_BASED_OUTPATIENT_CLINIC_OR_DEPARTMENT_OTHER): Payer: Medicare Other

## 2012-10-12 ENCOUNTER — Emergency Department (HOSPITAL_BASED_OUTPATIENT_CLINIC_OR_DEPARTMENT_OTHER)
Admission: EM | Admit: 2012-10-12 | Discharge: 2012-10-13 | Disposition: A | Discharge status: Home / Self Care | Payer: Medicare Other | Attending: Emergency Medicine | Admitting: Emergency Medicine

## 2012-10-12 DIAGNOSIS — Z8669 Personal history of other diseases of the nervous system and sense organs: Secondary | ICD-10-CM | POA: Diagnosis not present

## 2012-10-12 DIAGNOSIS — W108XXA Fall (on) (from) other stairs and steps, initial encounter: Secondary | ICD-10-CM | POA: Insufficient documentation

## 2012-10-12 DIAGNOSIS — F319 Bipolar disorder, unspecified: Secondary | ICD-10-CM | POA: Insufficient documentation

## 2012-10-12 DIAGNOSIS — Z8711 Personal history of peptic ulcer disease: Secondary | ICD-10-CM | POA: Insufficient documentation

## 2012-10-12 DIAGNOSIS — M549 Dorsalgia, unspecified: Secondary | ICD-10-CM

## 2012-10-12 DIAGNOSIS — M25569 Pain in unspecified knee: Secondary | ICD-10-CM | POA: Diagnosis not present

## 2012-10-12 DIAGNOSIS — S0990XA Unspecified injury of head, initial encounter: Secondary | ICD-10-CM | POA: Diagnosis not present

## 2012-10-12 DIAGNOSIS — S79919A Unspecified injury of unspecified hip, initial encounter: Secondary | ICD-10-CM | POA: Diagnosis not present

## 2012-10-12 DIAGNOSIS — M79609 Pain in unspecified limb: Secondary | ICD-10-CM | POA: Diagnosis not present

## 2012-10-12 DIAGNOSIS — IMO0002 Reserved for concepts with insufficient information to code with codable children: Secondary | ICD-10-CM | POA: Diagnosis not present

## 2012-10-12 DIAGNOSIS — G8929 Other chronic pain: Secondary | ICD-10-CM | POA: Diagnosis not present

## 2012-10-12 DIAGNOSIS — F17200 Nicotine dependence, unspecified, uncomplicated: Secondary | ICD-10-CM | POA: Insufficient documentation

## 2012-10-12 DIAGNOSIS — Z79899 Other long term (current) drug therapy: Secondary | ICD-10-CM | POA: Insufficient documentation

## 2012-10-12 DIAGNOSIS — S8990XA Unspecified injury of unspecified lower leg, initial encounter: Secondary | ICD-10-CM | POA: Diagnosis not present

## 2012-10-12 DIAGNOSIS — K219 Gastro-esophageal reflux disease without esophagitis: Secondary | ICD-10-CM | POA: Diagnosis not present

## 2012-10-12 DIAGNOSIS — R52 Pain, unspecified: Secondary | ICD-10-CM | POA: Diagnosis not present

## 2012-10-12 DIAGNOSIS — M545 Low back pain: Secondary | ICD-10-CM | POA: Diagnosis not present

## 2012-10-12 DIAGNOSIS — Y929 Unspecified place or not applicable: Secondary | ICD-10-CM | POA: Insufficient documentation

## 2012-10-12 DIAGNOSIS — F909 Attention-deficit hyperactivity disorder, unspecified type: Secondary | ICD-10-CM | POA: Insufficient documentation

## 2012-10-12 DIAGNOSIS — Y9301 Activity, walking, marching and hiking: Secondary | ICD-10-CM | POA: Insufficient documentation

## 2012-10-12 MED ORDER — HYDROMORPHONE HCL PF 1 MG/ML IJ SOLN
1.0000 mg | Freq: Once | INTRAMUSCULAR | Status: AC
  Administered 2012-10-12: 1 mg via INTRAVENOUS
  Filled 2012-10-12: qty 1

## 2012-10-12 MED ORDER — OXYCODONE-ACETAMINOPHEN 5-325 MG PO TABS: 2.0000 | ORAL_TABLET | ORAL | Status: DC | PRN

## 2012-10-12 NOTE — ED Notes (Signed)
Pt. Has returned from radiology.  Pt. In no distress.  Pt. Is sitting up in bed with no complaints of pain at this time.

## 2012-10-12 NOTE — ED Notes (Signed)
Pt. Is dressed and up walking steady gait to restroom.  No complaints at present time.

## 2012-10-12 NOTE — ED Provider Notes (Signed)
CSN: [id number]     Arrival date & time 10/12/12  2209 History  This chart was scribed for Rolan Bucco, MD by Caryn Bee, ED Scribe. This patient was seen in room MH09/MH09 and the patient's care was started 10:25 PM.    Chief Complaint  Patient presents with  . Fall    Patient is a 27 y.o. male presenting with fall. The history is provided by the patient. No language interpreter was used.  Fall This is a new problem. The current episode started 1 to 2 hours ago. The problem has not changed since onset.Pertinent negatives include no chest pain, no abdominal pain, no headaches and no shortness of breath. The symptoms are aggravated by walking, bending, twisting and standing (Palpation). Nothing relieves the symptoms. He has tried nothing for the symptoms.   HPI Comments: Marc Mayo is a 27 y.o. male who presents to the Emergency Department via EMS complaining of sudden onset, constant lower back and right flank pain from a fall that happened today when his leg gave out while he was walking up stairs. Pt states that he hit his head during the fall and had LOC for 5 minutes. Pt has pain around his right hip, right knee and thigh. Pt also has pain towards the back of his head.  Pt cannot move his right leg since the fall due to pain. Pt states that he has numbness in his right foot and lower right leg. He has a h/o herniated disc for which he was seeing pain management. Pt states that he he has intermittent numbness in his right leg that radiates to his foot at baseline.  Pt denies neck pain, chest pain, pain in his bilateral arms, right leg, or numbness around his buttocks. Pt is awaiting a visit to [facility] for his back.   Past Medical History  Diagnosis Date  . ADHD (attention deficit hyperactivity disorder)   . Bipolar affective disorder   . GERD (gastroesophageal reflux disease)   . Headache(784.0)     migraines  . History of gastric ulcer   . Fatty liver   .  Chronic back pain   . Chronic eustachian tube dysfunction 11/2011  . Sleep apnea     was taken off machine as breathing has gotten better  . IBS (irritable bowel syndrome)    Past Surgical History  Procedure Laterality Date  . Tympanostomy tube placement      x 7  . Myringotomy with tube placement  12/12/2011    Procedure: MYRINGOTOMY WITH TUBE PLACEMENT;  Surgeon: [name], MD;  Location: [facility];  Service: ENT;  Laterality: Bilateral;  Bilateral T Tubes  . Nasal endoscopy with epistaxis control  12/12/2011    Procedure: NASAL ENDOSCOPY WITH EPISTAXIS CONTROL;  Surgeon: [name], MD;  Location: [facility];  Service: ENT;  Laterality: Bilateral;  Direct Nasal Endoscopy with Cautery of Eustachian Tube Tori, Bilateral  . Esophagogastroduodenoscopy  2011    Dr. Darrick Penna: chronic non-H.pylori gastritis, normal small bowel biopsy    Family History  Problem Relation Age of Onset  . Colon cancer Neg Hx    History  Substance Use Topics  . Smoking status: Current Every Day Smoker -- 0.50 packs/day for 11 years    Types: Cigarettes  . Smokeless tobacco: Never Used  . Alcohol Use: No    Review of Systems  Constitutional: Negative for fever, chills, diaphoresis and fatigue.  HENT: Negative for congestion, rhinorrhea, sneezing  and neck pain.   Eyes: Negative.   Respiratory: Negative for cough, chest tightness and shortness of breath.   Cardiovascular: Negative for chest pain and leg swelling.  Gastrointestinal: Negative for nausea, vomiting, abdominal pain, diarrhea and blood in stool.  Genitourinary: Negative for frequency, hematuria, flank pain and difficulty urinating.  Musculoskeletal: Positive for back pain and arthralgias.  Skin: Negative for rash and wound.  Neurological: Positive for syncope, weakness and numbness (right foot and leg). Negative for dizziness, speech difficulty and headaches.    Allergies  Alprazolam; Sertraline hcl;  Celexa; Motrin; Tramadol; Ketorolac tromethamine; Lorazepam; Paroxetine hcl; and Prozac  Home Medications   Current Outpatient Rx  Name  Route  Sig  Dispense  Refill  . budesonide-formoterol (SYMBICORT) 160-4.5 MCG/ACT inhaler      Take 2 puffs first thing in am and then another 2 puffs about 12 hours later.   1 Inhaler   11   . cloNIDine (CATAPRES) 0.1 MG tablet      2 (two) times daily.         Marland Kitchen HYDROcodone-acetaminophen (NORCO/VICODIN) 5-325 MG per tablet   Oral   Take 2 tablets by mouth every 6 (six) hours as needed for pain.   12 tablet   0   . Linaclotide (LINZESS) 145 MCG CAPS capsule   Oral   Take 1 capsule (145 mcg total) by mouth daily.   30 capsule   5   . methocarbamol (ROBAXIN) 500 MG tablet   Oral   Take 1 tablet (500 mg total) by mouth 2 (two) times daily.   20 tablet   0   . omeprazole (PRILOSEC) 20 MG capsule      Take 30- 60 min before your first and last meals of the day   60 capsule   11   . oxyCODONE-acetaminophen (PERCOCET) 5-325 MG per tablet   Oral   Take 2 tablets by mouth every 4 (four) hours as needed for pain.   20 tablet   0   . predniSONE (DELTASONE) 20 MG tablet      3 tabs po day one, then 2 po daily x 4 days   11 tablet   0   . EXPIRED: promethazine (PHENERGAN) 25 MG tablet   Oral   Take 1 tablet (25 mg total) by mouth every 6 (six) hours as needed for nausea.   20 tablet   0   . promethazine (PHENERGAN) 25 MG tablet   Oral   Take 1 tablet (25 mg total) by mouth every 6 (six) hours as needed for nausea.   12 tablet   0    Triage Vitals: BP 143/99  Pulse 91  Temp(Src) 97.9 F (36.6 C) (Oral)  Resp 22  Ht 6\' 3"  (1.905 m)  Wt 285 lb (129.275 kg)  BMI 35.62 kg/m2  SpO2 99%  Physical Exam  Constitutional: He is oriented to person, place, and time. He appears well-developed and well-nourished.  HENT:  Head: Normocephalic and atraumatic.  Eyes: Pupils are equal, round, and reactive to light.  Neck: Normal  range of motion. Neck supple.  Patient is no pain along the cervical or thoracic spine. There is positive tenderness along the mid and lower lumbar sacral spine. No step-offs or deformities are noted. No wounds are noted.  Cardiovascular: Normal rate, regular rhythm and normal heart sounds.   Pulmonary/Chest: Effort normal and breath sounds normal. No respiratory distress. He has no wheezes. He has no rales. He exhibits no tenderness.  Abdominal: Soft. Bowel sounds are normal. There is no tenderness. There is no rebound and no guarding.  Musculoskeletal: Normal range of motion. He exhibits no edema.  He does have some pain along the right knee and the right mid thigh. There is no significant pain around the ankle or the lower leg. He has some diminished sensation to light touch in the foot. Pulses are intact. He is able to his toes he has good flexion and extension of the foot against resistance. There is no other pain on palpation or range of motion extremities.  Lymphadenopathy:    He has no cervical adenopathy.  Neurological: He is alert and oriented to person, place, and time. He has normal strength. A sensory deficit is present. No cranial nerve deficit.  Skin: Skin is warm and dry. No rash noted.  Psychiatric: He has a normal mood and affect.    ED Course  Procedures (including critical care time) DIAGNOSTIC STUDIES: Oxygen Saturation is 99% on room air, normal by my interpretation.    COORDINATION OF CARE: 10:34 PM-Discussed treatment plan which includes x-ray of right leg and pain medications with pt at bedside and pt agreed to plan.   Labs Review Labs Reviewed - No data to display Imaging Review Results for orders placed in visit on 06/27/12  PULMONARY FUNCTION TEST      Result Value Range   FEV1       FVC       FEV1/FVC       TLC       DLCO       Results for orders placed in visit on 06/27/12  PULMONARY FUNCTION TEST      Result Value Range   FEV1       FVC        FEV1/FVC       TLC       DLCO       Dg Lumbar Spine Complete  10/12/2012   CLINICAL DATA:  Trauma and pain.  EXAM: LUMBAR SPINE - COMPLETE 4+ VIEW  COMPARISON:  CTs 10/13/2010  FINDINGS: Five lumbar type vertebral bodies. Sacroiliac joints are symmetric. Maintenance of vertebral body height and alignment. Intervertebral disc heights are maintained.  IMPRESSION: No acute osseous abnormality.   Electronically Signed   By: Jeronimo Greaves   On: 10/12/2012 23:45   Dg Femur Right  10/12/2012   *RADIOLOGY REPORT*  Clinical Data: Fall with right leg pain.  RIGHT FEMUR - 2 VIEW  Comparison: None  Findings: No evidence of acute fracture, subluxation or dislocation identified.  No radio-opaque foreign bodies are present.  No focal bony lesions are noted.  The joint spaces are unremarkable.  IMPRESSION: No acute bony abnormalities.   Original Report Authenticated By: Harmon Pier, M.D.   Ct Head Wo Contrast  10/12/2012   *RADIOLOGY REPORT*  Clinical Data: Syncope, fall and loss of consciousness.  CT HEAD WITHOUT CONTRAST  Technique:  Contiguous axial images were obtained from the base of the skull through the vertex without contrast.  Comparison: 06/25/2010 CT  Findings: No intracranial abnormalities are identified, including mass lesion or mass effect, hydrocephalus, extra-axial fluid collection, midline shift, hemorrhage, or acute infarction.  The visualized bony calvarium is unremarkable.  IMPRESSION: Unremarkable noncontrast head CT.   Original Report Authenticated By: Harmon Pier, M.D.   Dg Knee Complete 4 Views Right  10/12/2012   *RADIOLOGY REPORT*  Clinical Data: Fall with right knee pain.  RIGHT KNEE - COMPLETE 4+ VIEW  Comparison: 09/29/2008 radiographs  Findings: There is no evidence of fracture, subluxation or dislocation. The joint space is unremarkable. There is no evidence of joint effusion. No focal bony lesions are present.  IMPRESSION: No acute abnormalities.   Original Report Authenticated By:  Harmon Pier, M.D.       MDM   1. Back pain    Patient is feeling much better after dose of pain medication. He's able to ambulate. He still has some pain radiating from his back to his right thigh. He currently says he has no numbness in his legs. No fractures are identified. He currently has no neurologic deficits. He was discharged home in good condition. He has appointment to followup with a new spine Center for ongoing management. Also advised him to followup with his primary care physician.  I personally performed the services described in this documentation, which was scribed in my presence.  The recorded information has been reviewed and considered.     Rolan Bucco, MD 10/12/12 [phone]

## 2012-10-12 NOTE — ED Notes (Signed)
Unwitnessed fall. Walking up steps and his right leg gave out causing him to fall. Injury to his lower back and right upper leg. Pain radiates down his leg and up his back.

## 2012-11-13 DIAGNOSIS — M5430 Sciatica, unspecified side: Secondary | ICD-10-CM | POA: Diagnosis not present

## 2012-11-13 DIAGNOSIS — M549 Dorsalgia, unspecified: Secondary | ICD-10-CM | POA: Diagnosis not present

## 2012-12-17 ENCOUNTER — Emergency Department (HOSPITAL_BASED_OUTPATIENT_CLINIC_OR_DEPARTMENT_OTHER)
Admission: EM | Admit: 2012-12-17 | Discharge: 2012-12-17 | Disposition: A | Discharge status: Home / Self Care | Payer: Medicare Other | Attending: Emergency Medicine | Admitting: Emergency Medicine

## 2012-12-17 ENCOUNTER — Encounter (HOSPITAL_BASED_OUTPATIENT_CLINIC_OR_DEPARTMENT_OTHER): Payer: Self-pay | Admitting: Emergency Medicine

## 2012-12-17 DIAGNOSIS — K219 Gastro-esophageal reflux disease without esophagitis: Secondary | ICD-10-CM | POA: Insufficient documentation

## 2012-12-17 DIAGNOSIS — G8929 Other chronic pain: Secondary | ICD-10-CM | POA: Diagnosis not present

## 2012-12-17 DIAGNOSIS — IMO0002 Reserved for concepts with insufficient information to code with codable children: Secondary | ICD-10-CM | POA: Insufficient documentation

## 2012-12-17 DIAGNOSIS — F319 Bipolar disorder, unspecified: Secondary | ICD-10-CM | POA: Diagnosis not present

## 2012-12-17 DIAGNOSIS — K589 Irritable bowel syndrome without diarrhea: Secondary | ICD-10-CM | POA: Diagnosis not present

## 2012-12-17 DIAGNOSIS — Z79899 Other long term (current) drug therapy: Secondary | ICD-10-CM | POA: Insufficient documentation

## 2012-12-17 DIAGNOSIS — M5430 Sciatica, unspecified side: Secondary | ICD-10-CM | POA: Insufficient documentation

## 2012-12-17 DIAGNOSIS — F909 Attention-deficit hyperactivity disorder, unspecified type: Secondary | ICD-10-CM | POA: Insufficient documentation

## 2012-12-17 DIAGNOSIS — M5431 Sciatica, right side: Secondary | ICD-10-CM

## 2012-12-17 DIAGNOSIS — F17200 Nicotine dependence, unspecified, uncomplicated: Secondary | ICD-10-CM | POA: Insufficient documentation

## 2012-12-17 MED ORDER — MELOXICAM 7.5 MG PO TABS: 7.5000 mg | ORAL_TABLET | Freq: Every day | ORAL | Status: DC

## 2012-12-17 MED ORDER — HYDROCODONE-ACETAMINOPHEN 5-325 MG PO TABS: 2.0000 | ORAL_TABLET | ORAL | Status: DC | PRN

## 2012-12-17 NOTE — ED Notes (Signed)
Back pain x 2 weeks-denies injury

## 2012-12-17 NOTE — ED Notes (Signed)
NP at bedside.

## 2012-12-17 NOTE — ED Provider Notes (Signed)
CSN: [id number]     Arrival date & time 12/17/12  2054 History   First MD Initiated Contact with Patient 12/17/12 2214     Chief Complaint  Patient presents with  . Back Pain   (Consider location/radiation/quality/duration/timing/severity/associated sxs/prior Treatment) Patient is a 27 y.o. male presenting with back pain. The history is provided by the patient.  Back Pain Location:  Lumbar spine Quality:  Burning and shooting Radiates to:  R thigh Pain severity:  Severe Pain is:  Same all the time Onset quality:  Gradual Duration:  1 week Timing:  Constant Progression:  Worsening Chronicity:  Chronic Relieved by:  Nothing Worsened by:  Movement, twisting, bending and ambulation Ineffective treatments:  Ibuprofen Associated symptoms: no abdominal pain, no chest pain, no dysuria, no fever, no headaches and no weakness    Marc Mayo is a 27 y.o. male who presents to the ED with chronic back pain. He states that the pain this time has been going on x 2 weeks. He has seen his PCP in the past for the pain and had an MRI that showed a "pinched nerve". He is to follow up with a Neurologist but has not had the appointment made yet.   Past Medical History  Diagnosis Date  . ADHD (attention deficit hyperactivity disorder)   . Bipolar affective disorder   . GERD (gastroesophageal reflux disease)   . Headache(784.0)     migraines  . History of gastric ulcer   . Fatty liver   . Chronic back pain   . Chronic eustachian tube dysfunction 11/2011  . Sleep apnea     was taken off machine as breathing has gotten better  . IBS (irritable bowel syndrome)    Past Surgical History  Procedure Laterality Date  . Tympanostomy tube placement      x 7  . Myringotomy with tube placement  12/12/2011    Procedure: MYRINGOTOMY WITH TUBE PLACEMENT;  Surgeon: [name], MD;  Location: [facility];  Service: ENT;  Laterality: Bilateral;  Bilateral T Tubes  . Nasal endoscopy with  epistaxis control  12/12/2011    Procedure: NASAL ENDOSCOPY WITH EPISTAXIS CONTROL;  Surgeon: [name], MD;  Location: [facility];  Service: ENT;  Laterality: Bilateral;  Direct Nasal Endoscopy with Cautery of Eustachian Tube Tori, Bilateral  . Esophagogastroduodenoscopy  2011    Dr. Darrick Penna: chronic non-H.pylori gastritis, normal small bowel biopsy    Family History  Problem Relation Age of Onset  . Colon cancer Neg Hx    History  Substance Use Topics  . Smoking status: Current Every Day Smoker -- 0.50 packs/day for 11 years    Types: Cigarettes  . Smokeless tobacco: Never Used  . Alcohol Use: No    Review of Systems  Constitutional: Negative for fever and chills.  HENT: Negative.   Eyes: Negative for visual disturbance.  Respiratory: Negative for chest tightness and shortness of breath.   Cardiovascular: Negative for chest pain.  Gastrointestinal: Negative for nausea, vomiting and abdominal pain.  Genitourinary: Negative for dysuria, urgency and frequency.  Musculoskeletal: Positive for back pain.  Skin: Negative for rash.  Neurological: Negative for weakness and headaches.  Psychiatric/Behavioral: The patient is not nervous/anxious.     Allergies  Alprazolam; Sertraline hcl; Celexa; Motrin; Tramadol; Ketorolac tromethamine; Lorazepam; Paroxetine hcl; and Prozac  Home Medications   Current Outpatient Rx  Name  Route  Sig  Dispense  Refill  . budesonide-formoterol (SYMBICORT) 160-4.5 MCG/ACT inhaler  Take 2 puffs first thing in am and then another 2 puffs about 12 hours later.   1 Inhaler   11   . cloNIDine (CATAPRES) 0.1 MG tablet      2 (two) times daily.         Marland Kitchen HYDROcodone-acetaminophen (NORCO/VICODIN) 5-325 MG per tablet   Oral   Take 2 tablets by mouth every 6 (six) hours as needed for pain.   12 tablet   0   . Linaclotide (LINZESS) 145 MCG CAPS capsule   Oral   Take 1 capsule (145 mcg total) by mouth daily.   30 capsule    5   . methocarbamol (ROBAXIN) 500 MG tablet   Oral   Take 1 tablet (500 mg total) by mouth 2 (two) times daily.   20 tablet   0   . omeprazole (PRILOSEC) 20 MG capsule      Take 30- 60 min before your first and last meals of the day   60 capsule   11   . oxyCODONE-acetaminophen (PERCOCET) 5-325 MG per tablet   Oral   Take 2 tablets by mouth every 4 (four) hours as needed for pain.   20 tablet   0   . predniSONE (DELTASONE) 20 MG tablet      3 tabs po day one, then 2 po daily x 4 days   11 tablet   0   . EXPIRED: promethazine (PHENERGAN) 25 MG tablet   Oral   Take 1 tablet (25 mg total) by mouth every 6 (six) hours as needed for nausea.   20 tablet   0   . promethazine (PHENERGAN) 25 MG tablet   Oral   Take 1 tablet (25 mg total) by mouth every 6 (six) hours as needed for nausea.   12 tablet   0    BP 159/86  Pulse 70  Temp(Src) 98.9 F (37.2 C) (Oral)  Resp 20  Ht 6\' 3"  (1.905 m)  Wt 283 lb (128.368 kg)  BMI 35.37 kg/m2  SpO2 100% Physical Exam  Nursing note and vitals reviewed. Constitutional: He is oriented to person, place, and time. He appears well-developed and well-nourished. No distress.  HENT:  Head: Normocephalic and atraumatic.  Eyes: Conjunctivae and EOM are normal.  Neck: Normal range of motion. Neck supple.  Cardiovascular: Normal rate.   Pulmonary/Chest: Effort normal.  Abdominal: Soft. There is no tenderness.  Musculoskeletal: Normal range of motion.       Lumbar back: He exhibits tenderness and spasm. He exhibits normal range of motion, no deformity, no laceration and normal pulse.       Back:  Neurological: He is alert and oriented to person, place, and time. He has normal strength and normal reflexes. No cranial nerve deficit or sensory deficit. Gait normal.  Patient has pain with straight leg raises on the right. Pain with palpation over right sciatic nerve. Patient ambulatory without foot drag. Pedal pulses equal, adequate  circulation, good touch sensation.  Skin: Skin is warm and dry.  Psychiatric: He has a normal mood and affect. His behavior is normal.    ED Course  Procedures  Patient states he can not take prednisone or Flexeril because they make him sleepy and feel bad.  EKG Interpretation   None      MDM  27 y.o. male with low back pain and sciatic nerve pain on the right. Discussed with the patient clinical findings and plan of care. All questioned fully answered. I  discussed with the patient need for treatment of his chronic pain to be done through his PCP rather than the ED. I will give him ten hydrocodone until he can see his PCP.    Medication List    TAKE these medications       meloxicam 7.5 MG tablet  Commonly known as:  MOBIC  Take 1 tablet (7.5 mg total) by mouth daily.      ASK your doctor about these medications       budesonide-formoterol 160-4.5 MCG/ACT inhaler  Commonly known as:  SYMBICORT  Take 2 puffs first thing in am and then another 2 puffs about 12 hours later.     cloNIDine 0.1 MG tablet  Commonly known as:  CATAPRES  2 (two) times daily.     HYDROcodone-acetaminophen 5-325 MG per tablet  Commonly known as:  NORCO/VICODIN  Take 2 tablets by mouth every 6 (six) hours as needed for pain.  Ask about: Which instructions should I use?     HYDROcodone-acetaminophen 5-325 MG per tablet  Commonly known as:  NORCO/VICODIN  Take 2 tablets by mouth every 4 (four) hours as needed.  Ask about: Which instructions should I use?     Linaclotide 145 MCG Caps capsule  Commonly known as:  LINZESS  Take 1 capsule (145 mcg total) by mouth daily.     methocarbamol 500 MG tablet  Commonly known as:  ROBAXIN  Take 1 tablet (500 mg total) by mouth 2 (two) times daily.     omeprazole 20 MG capsule  Commonly known as:  PRILOSEC  Take 30- 60 min before your first and last meals of the day     oxyCODONE-acetaminophen 5-325 MG per tablet  Commonly known as:  PERCOCET  Take 2  tablets by mouth every 4 (four) hours as needed for pain.     predniSONE 20 MG tablet  Commonly known as:  DELTASONE  3 tabs po day one, then 2 po daily x 4 days     promethazine 25 MG tablet  Commonly known as:  PHENERGAN  Take 1 tablet (25 mg total) by mouth every 6 (six) hours as needed for nausea.     promethazine 25 MG tablet  Commonly known as:  PHENERGAN  Take 1 tablet (25 mg total) by mouth every 6 (six) hours as needed for nausea.         [address] [place], [place] 12/18/12 [phone]

## 2012-12-18 NOTE — ED Provider Notes (Signed)
Medical screening examination/treatment/procedure(s) were performed by non-physician practitioner and as supervising physician I was immediately available for consultation/collaboration.  EKG Interpretation   None        Jvon Meroney K Linker, MD 12/18/12 1716 

## 2012-12-19 DIAGNOSIS — R030 Elevated blood-pressure reading, without diagnosis of hypertension: Secondary | ICD-10-CM | POA: Diagnosis not present

## 2012-12-31 DIAGNOSIS — R071 Chest pain on breathing: Secondary | ICD-10-CM | POA: Diagnosis not present

## 2012-12-31 DIAGNOSIS — F411 Generalized anxiety disorder: Secondary | ICD-10-CM | POA: Diagnosis not present

## 2012-12-31 DIAGNOSIS — M545 Low back pain: Secondary | ICD-10-CM | POA: Diagnosis not present

## 2013-01-15 ENCOUNTER — Encounter (HOSPITAL_BASED_OUTPATIENT_CLINIC_OR_DEPARTMENT_OTHER): Payer: Self-pay | Admitting: Emergency Medicine

## 2013-01-15 ENCOUNTER — Emergency Department (HOSPITAL_BASED_OUTPATIENT_CLINIC_OR_DEPARTMENT_OTHER)
Admission: EM | Admit: 2013-01-15 | Discharge: 2013-01-15 | Disposition: A | Discharge status: Home / Self Care | Payer: Medicare Other | Attending: Emergency Medicine | Admitting: Emergency Medicine

## 2013-01-15 DIAGNOSIS — Z79899 Other long term (current) drug therapy: Secondary | ICD-10-CM | POA: Diagnosis not present

## 2013-01-15 DIAGNOSIS — Z8719 Personal history of other diseases of the digestive system: Secondary | ICD-10-CM | POA: Diagnosis not present

## 2013-01-15 DIAGNOSIS — IMO0002 Reserved for concepts with insufficient information to code with codable children: Secondary | ICD-10-CM | POA: Insufficient documentation

## 2013-01-15 DIAGNOSIS — F319 Bipolar disorder, unspecified: Secondary | ICD-10-CM | POA: Diagnosis not present

## 2013-01-15 DIAGNOSIS — F909 Attention-deficit hyperactivity disorder, unspecified type: Secondary | ICD-10-CM | POA: Diagnosis not present

## 2013-01-15 DIAGNOSIS — R52 Pain, unspecified: Secondary | ICD-10-CM | POA: Diagnosis not present

## 2013-01-15 DIAGNOSIS — Y9389 Activity, other specified: Secondary | ICD-10-CM | POA: Insufficient documentation

## 2013-01-15 DIAGNOSIS — G8929 Other chronic pain: Secondary | ICD-10-CM | POA: Diagnosis not present

## 2013-01-15 DIAGNOSIS — Y92009 Unspecified place in unspecified non-institutional (private) residence as the place of occurrence of the external cause: Secondary | ICD-10-CM | POA: Insufficient documentation

## 2013-01-15 DIAGNOSIS — Z8709 Personal history of other diseases of the respiratory system: Secondary | ICD-10-CM | POA: Insufficient documentation

## 2013-01-15 DIAGNOSIS — F17200 Nicotine dependence, unspecified, uncomplicated: Secondary | ICD-10-CM | POA: Insufficient documentation

## 2013-01-15 DIAGNOSIS — K219 Gastro-esophageal reflux disease without esophagitis: Secondary | ICD-10-CM | POA: Diagnosis not present

## 2013-01-15 DIAGNOSIS — W208XXA Other cause of strike by thrown, projected or falling object, initial encounter: Secondary | ICD-10-CM | POA: Insufficient documentation

## 2013-01-15 DIAGNOSIS — M545 Low back pain: Secondary | ICD-10-CM | POA: Diagnosis not present

## 2013-01-15 MED ORDER — CYCLOBENZAPRINE HCL 10 MG PO TABS: 10.0000 mg | ORAL_TABLET | Freq: Three times a day (TID) | ORAL | Status: DC | PRN

## 2013-01-15 MED ORDER — NAPROXEN 500 MG PO TABS: 500.0000 mg | ORAL_TABLET | Freq: Two times a day (BID) | ORAL | Status: DC

## 2013-01-15 NOTE — ED Provider Notes (Signed)
CSN: [id number]     Arrival date & time 01/15/13  4098 History   First MD Initiated Contact with Patient 01/15/13 0801     Chief Complaint  Patient presents with  . Back Pain   (Consider location/radiation/quality/duration/timing/severity/associated sxs/prior Treatment) Patient is a 27 y.o. male presenting with back pain.  Back Pain  Pt with history of chronic back pain reports yesterday while working on his brother's garage a 2x6 board fell onto his low back exacerbating his typical back pain. He reports moderate aching pain in R lower back radiating into his R leg. No numbness or weakness.   Past Medical History  Diagnosis Date  . ADHD (attention deficit hyperactivity disorder)   . Bipolar affective disorder   . GERD (gastroesophageal reflux disease)   . Headache(784.0)     migraines  . History of gastric ulcer   . Fatty liver   . Chronic back pain   . Chronic eustachian tube dysfunction 11/2011  . Sleep apnea     was taken off machine as breathing has gotten better  . IBS (irritable bowel syndrome)    Past Surgical History  Procedure Laterality Date  . Tympanostomy tube placement      x 7  . Myringotomy with tube placement  12/12/2011    Procedure: MYRINGOTOMY WITH TUBE PLACEMENT;  Surgeon: [name], MD;  Location: [facility];  Service: ENT;  Laterality: Bilateral;  Bilateral T Tubes  . Nasal endoscopy with epistaxis control  12/12/2011    Procedure: NASAL ENDOSCOPY WITH EPISTAXIS CONTROL;  Surgeon: [name], MD;  Location: [facility];  Service: ENT;  Laterality: Bilateral;  Direct Nasal Endoscopy with Cautery of Eustachian Tube Tori, Bilateral  . Esophagogastroduodenoscopy  2011    Dr. Darrick Penna: chronic non-H.pylori gastritis, normal small bowel biopsy    Family History  Problem Relation Age of Onset  . Colon cancer Neg Hx    History  Substance Use Topics  . Smoking status: Current Every Day Smoker -- 0.50 packs/day for 11 years     Types: Cigarettes  . Smokeless tobacco: Never Used  . Alcohol Use: No    Review of Systems  Musculoskeletal: Positive for back pain.   All other systems reviewed and are negative except as noted in HPI.   Allergies  Alprazolam; Sertraline hcl; Celexa; Tramadol; Ketorolac tromethamine; Lorazepam; Paroxetine hcl; and Prozac  Home Medications   Current Outpatient Rx  Name  Route  Sig  Dispense  Refill  . budesonide-formoterol (SYMBICORT) 160-4.5 MCG/ACT inhaler      Take 2 puffs first thing in am and then another 2 puffs about 12 hours later.   1 Inhaler   11   . cloNIDine (CATAPRES) 0.1 MG tablet      2 (two) times daily.         Marland Kitchen HYDROcodone-acetaminophen (NORCO/VICODIN) 5-325 MG per tablet   Oral   Take 2 tablets by mouth every 6 (six) hours as needed for pain.   12 tablet   0   . HYDROcodone-acetaminophen (NORCO/VICODIN) 5-325 MG per tablet   Oral   Take 2 tablets by mouth every 4 (four) hours as needed.   10 tablet   0   . Linaclotide (LINZESS) 145 MCG CAPS capsule   Oral   Take 1 capsule (145 mcg total) by mouth daily.   30 capsule   5   . meloxicam (MOBIC) 7.5 MG tablet   Oral   Take 1 tablet (7.5 mg total)  by mouth daily.   10 tablet   0   . methocarbamol (ROBAXIN) 500 MG tablet   Oral   Take 1 tablet (500 mg total) by mouth 2 (two) times daily.   20 tablet   0   . omeprazole (PRILOSEC) 20 MG capsule      Take 30- 60 min before your first and last meals of the day   60 capsule   11   . oxyCODONE-acetaminophen (PERCOCET) 5-325 MG per tablet   Oral   Take 2 tablets by mouth every 4 (four) hours as needed for pain.   20 tablet   0   . predniSONE (DELTASONE) 20 MG tablet      3 tabs po day one, then 2 po daily x 4 days   11 tablet   0   . EXPIRED: promethazine (PHENERGAN) 25 MG tablet   Oral   Take 1 tablet (25 mg total) by mouth every 6 (six) hours as needed for nausea.   20 tablet   0   . promethazine (PHENERGAN) 25 MG  tablet   Oral   Take 1 tablet (25 mg total) by mouth every 6 (six) hours as needed for nausea.   12 tablet   0    BP 138/90  Pulse 92  Temp(Src) 98.6 F (37 C)  Resp 18  SpO2 100% Physical Exam  Nursing note reviewed. Constitutional: He is oriented to person, place, and time. He appears well-developed and well-nourished.  HENT:  Head: Normocephalic and atraumatic.  Eyes: EOM are normal. Pupils are equal, round, and reactive to light.  Neck: Normal range of motion. Neck supple.  Cardiovascular: Normal rate, normal heart sounds and intact distal pulses.   Pulmonary/Chest: Effort normal and breath sounds normal.  Abdominal: Bowel sounds are normal. He exhibits no distension. There is no tenderness.  Musculoskeletal: Normal range of motion. He exhibits tenderness (no midline spine tenderness, mild tenderness R lumbar paraspinal muscles). He exhibits no edema.  Neurological: He is alert and oriented to person, place, and time. He has normal strength. He displays normal reflexes. No cranial nerve deficit or sensory deficit.  Normal gait  Skin: Skin is warm and dry. No rash noted.  Psychiatric: He has a normal mood and affect.    ED Course  Procedures (including critical care time) Labs Review Labs Reviewed - No data to display Imaging Review No results found.  EKG Interpretation   None       MDM   1. Acute exacerbation of chronic low back pain     Acute on chronic low back pain, possible mild contusion, but no outward signs of trauma. Advised no narcotics indicated. Recommend NSAID, Flexeril and PCP followup.     Vyncent Overby B. Bernette Mayers, MD 01/15/13 [phone]

## 2013-01-15 NOTE — ED Notes (Signed)
Patient c/o lower back pain, hit in back by falling lumber yesterday, took ibuprofen but no relief, ambulates w/o difficulty

## 2013-02-03 ENCOUNTER — Encounter (HOSPITAL_BASED_OUTPATIENT_CLINIC_OR_DEPARTMENT_OTHER): Payer: Self-pay | Admitting: Emergency Medicine

## 2013-02-03 ENCOUNTER — Emergency Department (HOSPITAL_BASED_OUTPATIENT_CLINIC_OR_DEPARTMENT_OTHER)
Admission: EM | Admit: 2013-02-03 | Discharge: 2013-02-04 | Disposition: A | Discharge status: Home / Self Care | Payer: Medicare Other | Attending: Emergency Medicine | Admitting: Emergency Medicine

## 2013-02-03 ENCOUNTER — Emergency Department (HOSPITAL_BASED_OUTPATIENT_CLINIC_OR_DEPARTMENT_OTHER): Payer: Medicare Other

## 2013-02-03 DIAGNOSIS — F17200 Nicotine dependence, unspecified, uncomplicated: Secondary | ICD-10-CM | POA: Insufficient documentation

## 2013-02-03 DIAGNOSIS — R079 Chest pain, unspecified: Secondary | ICD-10-CM | POA: Diagnosis not present

## 2013-02-03 DIAGNOSIS — F909 Attention-deficit hyperactivity disorder, unspecified type: Secondary | ICD-10-CM | POA: Insufficient documentation

## 2013-02-03 DIAGNOSIS — K219 Gastro-esophageal reflux disease without esophagitis: Secondary | ICD-10-CM | POA: Diagnosis not present

## 2013-02-03 DIAGNOSIS — G8929 Other chronic pain: Secondary | ICD-10-CM | POA: Diagnosis not present

## 2013-02-03 DIAGNOSIS — Z8711 Personal history of peptic ulcer disease: Secondary | ICD-10-CM | POA: Insufficient documentation

## 2013-02-03 DIAGNOSIS — K589 Irritable bowel syndrome without diarrhea: Secondary | ICD-10-CM | POA: Insufficient documentation

## 2013-02-03 DIAGNOSIS — F319 Bipolar disorder, unspecified: Secondary | ICD-10-CM | POA: Diagnosis not present

## 2013-02-03 DIAGNOSIS — I10 Essential (primary) hypertension: Secondary | ICD-10-CM | POA: Diagnosis not present

## 2013-02-03 DIAGNOSIS — M79609 Pain in unspecified limb: Secondary | ICD-10-CM | POA: Diagnosis not present

## 2013-02-03 DIAGNOSIS — Z8659 Personal history of other mental and behavioral disorders: Secondary | ICD-10-CM | POA: Diagnosis not present

## 2013-02-03 DIAGNOSIS — R9431 Abnormal electrocardiogram [ECG] [EKG]: Secondary | ICD-10-CM | POA: Diagnosis not present

## 2013-02-03 DIAGNOSIS — Z791 Long term (current) use of non-steroidal anti-inflammatories (NSAID): Secondary | ICD-10-CM | POA: Diagnosis not present

## 2013-02-03 DIAGNOSIS — IMO0002 Reserved for concepts with insufficient information to code with codable children: Secondary | ICD-10-CM | POA: Diagnosis not present

## 2013-02-03 DIAGNOSIS — R0789 Other chest pain: Secondary | ICD-10-CM | POA: Insufficient documentation

## 2013-02-03 DIAGNOSIS — Z79899 Other long term (current) drug therapy: Secondary | ICD-10-CM | POA: Insufficient documentation

## 2013-02-03 DIAGNOSIS — R0602 Shortness of breath: Secondary | ICD-10-CM | POA: Diagnosis not present

## 2013-02-03 DIAGNOSIS — G43909 Migraine, unspecified, not intractable, without status migrainosus: Secondary | ICD-10-CM | POA: Insufficient documentation

## 2013-02-03 LAB — COMPREHENSIVE METABOLIC PANEL
ALBUMIN: 4.1 g/dL (ref 3.5–5.2)
ALK PHOS: 72 U/L (ref 39–117)
ALT: 28 U/L (ref 0–53)
AST: 20 U/L (ref 0–37)
BILIRUBIN TOTAL: 0.5 mg/dL (ref 0.3–1.2)
BUN: 5 mg/dL — ABNORMAL LOW (ref 6–23)
CHLORIDE: 102 meq/L (ref 96–112)
CO2: 22 mEq/L (ref 19–32)
Calcium: 9.3 mg/dL (ref 8.4–10.5)
Creatinine, Ser: 0.7 mg/dL (ref 0.50–1.35)
GFR calc Af Amer: >90 mL/min (ref >90)
GFR calc non Af Amer: >90 mL/min (ref >90)
Glucose, Bld: 94 mg/dL (ref 70–99)
POTASSIUM: 3.7 meq/L (ref 3.7–5.3)
Sodium: 142 mEq/L (ref 137–147)
Total Protein: 7.6 g/dL (ref 6.0–8.3)

## 2013-02-03 LAB — CBC WITH DIFFERENTIAL/PLATELET
Basophils Absolute: 0.1 10*3/uL (ref 0.0–0.1)
Basophils Relative: 1 % (ref 0–1)
EOS ABS: 0.5 10*3/uL (ref 0.0–0.7)
EOS PCT: 5 % (ref 0–5)
HCT: 44.9 % (ref 39.0–52.0)
Hemoglobin: 15.5 g/dL (ref 13.0–17.0)
LYMPHS ABS: 4.6 10*3/uL — AB (ref 0.7–4.0)
Lymphocytes Relative: 41 % (ref 12–46)
MCH: 30.5 pg (ref 26.0–34.0)
MCHC: 34.5 g/dL (ref 30.0–36.0)
MCV: 88.2 fL (ref 78.0–100.0)
Monocytes Absolute: 0.8 10*3/uL (ref 0.1–1.0)
Monocytes Relative: 7 % (ref 3–12)
Neutro Abs: 5.1 10*3/uL (ref 1.7–7.7)
Neutrophils Relative %: 46 % (ref 43–77)
PLATELETS: 267 10*3/uL (ref 150–400)
RBC: 5.09 MIL/uL (ref 4.22–5.81)
RDW: 13.1 % (ref 11.5–15.5)
WBC: 11.1 10*3/uL — ABNORMAL HIGH (ref 4.0–10.5)

## 2013-02-03 LAB — D-DIMER, QUANTITATIVE (NOT AT ARMC)

## 2013-02-03 LAB — TROPONIN I: Troponin I: <0.3 ng/mL (ref <0.30)

## 2013-02-03 NOTE — ED Provider Notes (Addendum)
CSN: [id number]     Arrival date & time 02/03/13  1959 History   First MD Initiated Contact with Patient 02/03/13 2109    This chart was scribed for Shaune Pollack, MD by Lovena Le Day, ED scribe. This patient was seen in room MH09/MH09 and the patient's care was started at 2109.  Chief Complaint  Patient presents with  . Chest Pain   The history is provided by the patient. No language interpreter was used.   HPI Comments: Marc Mayo is a 28 y.o. male who presents to the Emergency Department complaining of intermittent burning CP, onset yesterday PM and worsened to a constant paint since 19 hours ago. He initally thought maybe it was GERD, EMS was called and did an EKG yesterday which appeared normal. Today he went to [facility] for same (had another normal EKG) and sent him here for further evaluation. He reports associated SOB. He states mild cough and has chronic bronchitis. He is a smoker and smoked about 6 cigarettes today, didn't change his SOB or cough. He denies fever.    Dx w/HTN x2 years ago.  Past Medical History  Diagnosis Date  . ADHD (attention deficit hyperactivity disorder)   . Bipolar affective disorder   . GERD (gastroesophageal reflux disease)   . Headache(784.0)     migraines  . History of gastric ulcer   . Fatty liver   . Chronic back pain   . Chronic eustachian tube dysfunction 11/2011  . Sleep apnea     was taken off machine as breathing has gotten better  . IBS (irritable bowel syndrome)    Past Surgical History  Procedure Laterality Date  . Tympanostomy tube placement      x 7  . Myringotomy with tube placement  12/12/2011    Procedure: MYRINGOTOMY WITH TUBE PLACEMENT;  Surgeon: Jodi Marble, MD;  Location: [facility];  Service: ENT;  Laterality: Bilateral;  Bilateral T Tubes  . Nasal endoscopy with epistaxis control  12/12/2011    Procedure: NASAL ENDOSCOPY WITH EPISTAXIS CONTROL;  Surgeon: Jodi Marble, MD;  Location: [facility];  Service: ENT;  Laterality: Bilateral;  Direct Nasal Endoscopy with Cautery of Eustachian Tube Tori, Bilateral  . Esophagogastroduodenoscopy  2011    Dr. Oneida Alar: chronic non-H.pylori gastritis, normal small bowel biopsy    Family History  Problem Relation Age of Onset  . Colon cancer Neg Hx    History  Substance Use Topics  . Smoking status: Current Every Day Smoker -- 0.50 packs/day for 11 years    Types: Cigarettes  . Smokeless tobacco: Never Used  . Alcohol Use: No    Review of Systems  Constitutional: Negative for fever and chills.  Respiratory: Negative for cough and shortness of breath.   Cardiovascular: Positive for chest pain.  Gastrointestinal: Negative for abdominal pain.  Musculoskeletal: Negative for back pain.  All other systems reviewed and are negative.   A complete 10 system review of systems was obtained and all systems are negative except as noted in the HPI and PMH.   Allergies  Alprazolam; Sertraline hcl; Celexa; Tramadol; Ketorolac tromethamine; Lorazepam; Paroxetine hcl; and Prozac  Home Medications   Current Outpatient Rx  Name  Route  Sig  Dispense  Refill  . budesonide-formoterol (SYMBICORT) 160-4.5 MCG/ACT inhaler      Take 2 puffs first thing in am and then another 2 puffs about 12 hours later.   1 Inhaler   11   . cloNIDine (  CATAPRES) 0.1 MG tablet      2 (two) times daily.         . cyclobenzaprine (FLEXERIL) 10 MG tablet   Oral   Take 1 tablet (10 mg total) by mouth 3 (three) times daily as needed for muscle spasms.   30 tablet   0   . Linaclotide (LINZESS) 145 MCG CAPS capsule   Oral   Take 1 capsule (145 mcg total) by mouth daily.   30 capsule   5   . naproxen (NAPROSYN) 500 MG tablet   Oral   Take 1 tablet (500 mg total) by mouth 2 (two) times daily.   30 tablet   0   . omeprazole (PRILOSEC) 20 MG capsule      Take 30- 60 min before your first and last meals of the day   60 capsule   11    Triage Vitals:  BP 137/85  Pulse 98  Temp(Src) 99 F (37.2 C) (Oral)  Resp 22  Ht 6\' 3"  (1.905 m)  Wt 284 lb (128.822 kg)  BMI 35.50 kg/m2  SpO2 100% Physical Exam  Nursing note and vitals reviewed. Constitutional: He is oriented to person, place, and time. He appears well-developed and well-nourished. No distress.  HENT:  Head: Normocephalic and atraumatic.  Eyes: Conjunctivae are normal. Right eye exhibits no discharge. Left eye exhibits no discharge.  Neck: Normal range of motion.  Cardiovascular: Normal rate, regular rhythm and normal heart sounds.   No murmur heard. Pulmonary/Chest: Effort normal and breath sounds normal. No respiratory distress. He has no wheezes. He has no rales.  Musculoskeletal: Normal range of motion. He exhibits no edema.  Neurological: He is alert and oriented to person, place, and time.  Skin: Skin is warm and dry.  Psychiatric: He has a normal mood and affect. Thought content normal.    ED Course  Procedures (including critical care time) DIAGNOSTIC STUDIES: Oxygen Saturation is 100% on room air, normal by my interpretation.    COORDINATION OF CARE: At 299 PM Discussed treatment plan with patient which includes cardiac enzymes, CXR, EKG, blood work. Patient agrees.   Labs Review Labs Reviewed  COMPREHENSIVE METABOLIC PANEL - Abnormal; Notable for the following:    BUN 5 (*)    All other components within normal limits  CBC WITH DIFFERENTIAL - Abnormal; Notable for the following:    WBC 11.1 (*)    Lymphs Abs 4.6 (*)    All other components within normal limits  TROPONIN I   Imaging Review Dg Chest 2 View  02/03/2013   CLINICAL DATA:  Chest pain for 1 day.  EXAM: CHEST  2 VIEW  COMPARISON:  DG CHEST 2 VIEW dated 05/01/2012  FINDINGS: Midline trachea.  Normal heart size and mediastinal contours.  Sharp costophrenic angles.  No pneumothorax.  Clear lungs.  IMPRESSION: No acute cardiopulmonary disease.   Electronically Signed   By: Abigail Miyamoto M.D.   On:  02/03/2013 21:00   EKG Interpretation    Date/Time:  Sunday February 03 2013 20:13:33 EST Ventricular Rate:  100 PR Interval:  144 QRS Duration: 86 QT Interval:  330 QTC Calculation: 425 R Axis:   48 Text Interpretation:  Normal sinus rhythm Nonspecific T wave abnormality Abnormal ECG Confirmed by  MD,  ([id number]) on 02/03/2013 10:13:22 PM           27  year old male with atypical chest pain and has been present for 19 hours. EKG shows some nonspecific ST-T wave changes.  Chest x-Atwell Mcdanel and labs are normal with a normal troponin. He has a negative perc test for pulmonary embolism. He is given return precautions and advised to followup in the next one to 2 days with his primary care Dr.     Shaune Pollack, MD 02/03/13 [place], MD 02/03/13 2342

## 2013-02-03 NOTE — Discharge Instructions (Signed)
Chest Pain (Nonspecific) °It is often hard to give a specific diagnosis for the cause of chest pain. There is always a chance that your pain could be related to something serious, such as a heart attack or a blood clot in the lungs. You need to follow up with your caregiver for further evaluation. °CAUSES  °· Heartburn. °· Pneumonia or bronchitis. °· Anxiety or stress. °· Inflammation around your heart (pericarditis) or lung (pleuritis or pleurisy). °· A blood clot in the lung. °· A collapsed lung (pneumothorax). It can develop suddenly on its own (spontaneous pneumothorax) or from injury (trauma) to the chest. °· Shingles infection (herpes zoster virus). °The chest wall is composed of bones, muscles, and cartilage. Any of these can be the source of the pain. °· The bones can be bruised by injury. °· The muscles or cartilage can be strained by coughing or overwork. °· The cartilage can be affected by inflammation and become sore (costochondritis). °DIAGNOSIS  °Lab tests or other studies, such as X-rays, electrocardiography, stress testing, or cardiac imaging, may be needed to find the cause of your pain.  °TREATMENT  °· Treatment depends on what may be causing your chest pain. Treatment may include: °· Acid blockers for heartburn. °· Anti-inflammatory medicine. °· Pain medicine for inflammatory conditions. °· Antibiotics if an infection is present. °· You may be advised to change lifestyle habits. This includes stopping smoking and avoiding alcohol, caffeine, and chocolate. °· You may be advised to keep your head raised (elevated) when sleeping. This reduces the chance of acid going backward from your stomach into your esophagus. °· Most of the time, nonspecific chest pain will improve within 2 to 3 days with rest and mild pain medicine. °HOME CARE INSTRUCTIONS  °· If antibiotics were prescribed, take your antibiotics as directed. Finish them even if you start to feel better. °· For the next few days, avoid physical  activities that bring on chest pain. Continue physical activities as directed. °· Do not smoke. °· Avoid drinking alcohol. °· Only take over-the-counter or prescription medicine for pain, discomfort, or fever as directed by your caregiver. °· Follow your caregiver's suggestions for further testing if your chest pain does not go away. °· Keep any follow-up appointments you made. If you do not go to an appointment, you could develop lasting (chronic) problems with pain. If there is any problem keeping an appointment, you must call to reschedule. °SEEK MEDICAL CARE IF:  °· You think you are having problems from the medicine you are taking. Read your medicine instructions carefully. °· Your chest pain does not go away, even after treatment. °· You develop a rash with blisters on your chest. °SEEK IMMEDIATE MEDICAL CARE IF:  °· You have increased chest pain or pain that spreads to your arm, neck, jaw, back, or abdomen. °· You develop shortness of breath, an increasing cough, or you are coughing up blood. °· You have severe back or abdominal pain, feel nauseous, or vomit. °· You develop severe weakness, fainting, or chills. °· You have a fever. °THIS IS AN EMERGENCY. Do not wait to see if the pain will go away. Get medical help at once. Call your local emergency services (911 in [place] Do not drive yourself to the hospital. °MAKE SURE YOU:  °· Understand these instructions. °· Will watch your condition. °· Will get help right away if you are not doing well or get worse. °Document Released: 10/13/2004 Document Revised: 03/28/2011 Document Reviewed: 08/09/2007 °[employer]® Patient Information ©2014 [employer]   [employer]. ° °

## 2013-02-03 NOTE — ED Notes (Signed)
Pt reports chest pain onset 1 day ago at 16:00 pt states he went to [facility] clinic today for same who sent him to [facility] for further care. Pt reports midsternal chest pain that radiates to Left arm and SOB.

## 2013-02-04 NOTE — ED Notes (Signed)
D/c home. Requesting medication for his "anxiety" at d/c. Explained that he should follow up with his PCP

## 2013-02-05 ENCOUNTER — Emergency Department (HOSPITAL_COMMUNITY)
Admission: EM | Admit: 2013-02-05 | Discharge: 2013-02-05 | Discharge status: Against Medical Advice | Payer: Medicare Other | Attending: Emergency Medicine | Admitting: Emergency Medicine

## 2013-02-05 ENCOUNTER — Encounter (HOSPITAL_COMMUNITY): Payer: Self-pay | Admitting: Emergency Medicine

## 2013-02-05 DIAGNOSIS — F411 Generalized anxiety disorder: Secondary | ICD-10-CM | POA: Diagnosis not present

## 2013-02-05 DIAGNOSIS — R079 Chest pain, unspecified: Secondary | ICD-10-CM | POA: Insufficient documentation

## 2013-02-05 DIAGNOSIS — F17200 Nicotine dependence, unspecified, uncomplicated: Secondary | ICD-10-CM | POA: Insufficient documentation

## 2013-02-05 LAB — BASIC METABOLIC PANEL
BUN: 6 mg/dL (ref 6–23)
CHLORIDE: 104 meq/L (ref 96–112)
CO2: 22 meq/L (ref 19–32)
Calcium: 9.3 mg/dL (ref 8.4–10.5)
Creatinine, Ser: 0.65 mg/dL (ref 0.50–1.35)
GFR calc Af Amer: >90 mL/min (ref >90)
GFR calc non Af Amer: >90 mL/min (ref >90)
Glucose, Bld: 89 mg/dL (ref 70–99)
Potassium: 4.2 mEq/L (ref 3.7–5.3)
SODIUM: 140 meq/L (ref 137–147)

## 2013-02-05 LAB — CBC
HCT: 45.1 % (ref 39.0–52.0)
Hemoglobin: 15.9 g/dL (ref 13.0–17.0)
MCH: 30.7 pg (ref 26.0–34.0)
MCHC: 35.3 g/dL (ref 30.0–36.0)
MCV: 87.1 fL (ref 78.0–100.0)
Platelets: 258 10*3/uL (ref 150–400)
RBC: 5.18 MIL/uL (ref 4.22–5.81)
RDW: 12.8 % (ref 11.5–15.5)
WBC: 11.2 10*3/uL — AB (ref 4.0–10.5)

## 2013-02-05 LAB — POCT I-STAT TROPONIN I: TROPONIN I, POC: 0 ng/mL (ref 0.00–0.08)

## 2013-02-05 NOTE — ED Notes (Signed)
Pt wife sts pt is leaving

## 2013-02-05 NOTE — ED Notes (Signed)
Per EMS, pt came in today with chest pain. sts more anxiety. sts NSR on the monitor. VS stable.

## 2013-02-06 DIAGNOSIS — F411 Generalized anxiety disorder: Secondary | ICD-10-CM | POA: Diagnosis not present

## 2013-02-06 DIAGNOSIS — R079 Chest pain, unspecified: Secondary | ICD-10-CM | POA: Diagnosis not present

## 2013-02-07 ENCOUNTER — Emergency Department (HOSPITAL_COMMUNITY): Payer: Medicare Other

## 2013-02-07 ENCOUNTER — Encounter (HOSPITAL_COMMUNITY): Payer: Self-pay | Admitting: Emergency Medicine

## 2013-02-07 ENCOUNTER — Emergency Department (HOSPITAL_COMMUNITY)
Admission: EM | Admit: 2013-02-07 | Discharge: 2013-02-07 | Disposition: A | Discharge status: Home / Self Care | Payer: Medicare Other | Attending: Emergency Medicine | Admitting: Emergency Medicine

## 2013-02-07 DIAGNOSIS — R079 Chest pain, unspecified: Secondary | ICD-10-CM | POA: Diagnosis present

## 2013-02-07 DIAGNOSIS — Z8659 Personal history of other mental and behavioral disorders: Secondary | ICD-10-CM | POA: Insufficient documentation

## 2013-02-07 DIAGNOSIS — Z79899 Other long term (current) drug therapy: Secondary | ICD-10-CM | POA: Insufficient documentation

## 2013-02-07 DIAGNOSIS — I10 Essential (primary) hypertension: Secondary | ICD-10-CM | POA: Diagnosis not present

## 2013-02-07 DIAGNOSIS — G8929 Other chronic pain: Secondary | ICD-10-CM | POA: Insufficient documentation

## 2013-02-07 DIAGNOSIS — R0602 Shortness of breath: Secondary | ICD-10-CM | POA: Diagnosis not present

## 2013-02-07 DIAGNOSIS — F17200 Nicotine dependence, unspecified, uncomplicated: Secondary | ICD-10-CM | POA: Diagnosis not present

## 2013-02-07 DIAGNOSIS — R0789 Other chest pain: Secondary | ICD-10-CM | POA: Diagnosis not present

## 2013-02-07 DIAGNOSIS — R1110 Vomiting, unspecified: Secondary | ICD-10-CM | POA: Diagnosis not present

## 2013-02-07 DIAGNOSIS — Z8669 Personal history of other diseases of the nervous system and sense organs: Secondary | ICD-10-CM | POA: Insufficient documentation

## 2013-02-07 DIAGNOSIS — K219 Gastro-esophageal reflux disease without esophagitis: Secondary | ICD-10-CM | POA: Diagnosis not present

## 2013-02-07 DIAGNOSIS — R209 Unspecified disturbances of skin sensation: Secondary | ICD-10-CM | POA: Diagnosis not present

## 2013-02-07 LAB — CBC
HEMATOCRIT: 44.5 % (ref 39.0–52.0)
HEMOGLOBIN: 15.9 g/dL (ref 13.0–17.0)
MCH: 31.4 pg (ref 26.0–34.0)
MCHC: 35.7 g/dL (ref 30.0–36.0)
MCV: 87.9 fL (ref 78.0–100.0)
PLATELETS: 263 10*3/uL (ref 150–400)
RBC: 5.06 MIL/uL (ref 4.22–5.81)
RDW: 12.8 % (ref 11.5–15.5)
WBC: 10.3 10*3/uL (ref 4.0–10.5)

## 2013-02-07 LAB — PRO B NATRIURETIC PEPTIDE

## 2013-02-07 LAB — BASIC METABOLIC PANEL
BUN: 4 mg/dL — AB (ref 6–23)
CHLORIDE: 102 meq/L (ref 96–112)
CO2: 20 meq/L (ref 19–32)
CREATININE: 0.67 mg/dL (ref 0.50–1.35)
Calcium: 9.2 mg/dL (ref 8.4–10.5)
GFR calc Af Amer: >90 mL/min (ref >90)
GFR calc non Af Amer: >90 mL/min (ref >90)
Glucose, Bld: 108 mg/dL — ABNORMAL HIGH (ref 70–99)
Potassium: 3.7 mEq/L (ref 3.7–5.3)
Sodium: 140 mEq/L (ref 137–147)

## 2013-02-07 LAB — POCT I-STAT TROPONIN I: Troponin i, poc: 0 ng/mL (ref 0.00–0.08)

## 2013-02-07 MED ORDER — DEXAMETHASONE SODIUM PHOSPHATE 10 MG/ML IJ SOLN
10.0000 mg | Freq: Once | INTRAMUSCULAR | Status: AC
  Administered 2013-02-07: 10 mg via INTRAMUSCULAR
  Filled 2013-02-07: qty 1

## 2013-02-07 NOTE — ED Provider Notes (Signed)
CSN: [id number]     Arrival date & time 02/07/13  1751 History   First MD Initiated Contact with Patient 02/07/13 2018     Chief Complaint  Patient presents with  . Chest Pain   (Consider location/radiation/quality/duration/timing/severity/associated sxs/prior Treatment) HPI Comments: Patient is a 28 year old male with a history of tobacco use who presents emergency department for chest pain. Patient states his chest is in nature and present in his left mid chest, radiating down his left anterior axillary line. Pain has been constant x5 days and relieved mildly, but not completely, with Valium or rest. Patient states that activity worsens the pain. He endorses one episode of nonbloody, nonbilious emesis 5 days ago as well as intermittent subjective tingling in his left arm. Patient denies associated fever, vision changes, jaw pain, syncope, numbness, extremity weakness, and shortness of breath. Patient endorses a history of ACS in his mother at the age of 45. He denies a history of diabetes, hyperlipidemia, ACS, and DVT/PE. Patient states he has a history of hypertension, but his primary doctor did not put him on any medication for this; his blood pressure is 127/87 in the exam room.  Brother pulled me aside to speak with me alone. He states that he believes patient's chest pains to be for the sake of attention. He states that symptom onset was after he stopped taking a number of narcotic medications. Brother states the patient was dismissed from his prior primary care office for drug-seeking behavior.  Patient is a 28 y.o. male presenting with chest pain. The history is provided by the patient. No language interpreter was used.  Chest Pain Associated symptoms: vomiting   Associated symptoms: no abdominal pain, no fever, no nausea, no numbness, no shortness of breath and no weakness     Past Medical History  Diagnosis Date  . ADHD (attention deficit hyperactivity disorder)   . Bipolar affective  disorder   . GERD (gastroesophageal reflux disease)   . Headache(784.0)     migraines  . History of gastric ulcer   . Fatty liver   . Chronic back pain   . Chronic eustachian tube dysfunction 11/2011  . Sleep apnea     was taken off machine as breathing has gotten better  . IBS (irritable bowel syndrome)    Past Surgical History  Procedure Laterality Date  . Tympanostomy tube placement      x 7  . Myringotomy with tube placement  12/12/2011    Procedure: MYRINGOTOMY WITH TUBE PLACEMENT;  Surgeon: Jodi Marble, MD;  Location: [facility];  Service: ENT;  Laterality: Bilateral;  Bilateral T Tubes  . Nasal endoscopy with epistaxis control  12/12/2011    Procedure: NASAL ENDOSCOPY WITH EPISTAXIS CONTROL;  Surgeon: Jodi Marble, MD;  Location: [facility];  Service: ENT;  Laterality: Bilateral;  Direct Nasal Endoscopy with Cautery of Eustachian Tube Tori, Bilateral  . Esophagogastroduodenoscopy  2011    Dr. Oneida Alar: chronic non-H.pylori gastritis, normal small bowel biopsy    Family History  Problem Relation Age of Onset  . Colon cancer Neg Hx    History  Substance Use Topics  . Smoking status: Current Every Day Smoker -- 0.50 packs/day for 11 years    Types: Cigarettes  . Smokeless tobacco: Never Used  . Alcohol Use: No    Review of Systems  Constitutional: Negative for fever.  Respiratory: Negative for shortness of breath.   Cardiovascular: Positive for chest pain.  Gastrointestinal: Positive for vomiting. Negative for  nausea and abdominal pain.  Neurological: Negative for syncope, weakness and numbness.       +tingling LUE  All other systems reviewed and are negative.    Allergies  Alprazolam; Sertraline hcl; Celexa; Tramadol; Ketorolac tromethamine; Lorazepam; Paroxetine hcl; and Prozac  Home Medications   Current Outpatient Rx  Name  Route  Sig  Dispense  Refill  . budesonide-formoterol (SYMBICORT) 160-4.5 MCG/ACT inhaler    Inhalation   Inhale 2 puffs into the lungs 2 (two) times daily.         . diazepam (VALIUM) 5 MG tablet   Oral   Take 5 mg by mouth every 8 (eight) hours as needed for anxiety.         Marland Kitchen ibuprofen (ADVIL,MOTRIN) 200 MG tablet   Oral   Take 400-800 mg by mouth every 6 (six) hours as needed.         Marland Kitchen omeprazole (PRILOSEC) 20 MG capsule   Oral   Take 20 mg by mouth daily.          BP 122/81  Pulse 87  Temp(Src) 98 F (36.7 C) (Oral)  Resp 20  SpO2 99%  Physical Exam  Nursing note and vitals reviewed. Constitutional: He is oriented to person, place, and time. He appears well-developed and well-nourished. No distress.  HENT:  Head: Normocephalic and atraumatic.  Mouth/Throat: Oropharynx is clear and moist. No oropharyngeal exudate.  Eyes: Conjunctivae and EOM are normal. Pupils are equal, round, and reactive to light. No scleral icterus.  Neck: Normal range of motion. Neck supple.  Cardiovascular: Normal rate, regular rhythm and normal heart sounds.   No JVD  Pulmonary/Chest: Effort normal and breath sounds normal. No respiratory distress. He has no wheezes. He has no rales.  Abdominal: Soft. He exhibits no distension. There is no tenderness. There is no rebound and no guarding.  No peritoneal signs  Musculoskeletal: Normal range of motion.  Neurological: He is alert and oriented to person, place, and time.  No gross sensory deficits appreciated. Patient moves extremities without ataxia.  Skin: Skin is warm and dry. No rash noted. He is not diaphoretic. No erythema. No pallor.  Psychiatric: He has a normal mood and affect. His behavior is normal.    ED Course  Procedures (including critical care time) Labs Review Labs Reviewed  BASIC METABOLIC PANEL - Abnormal; Notable for the following:    Glucose, Bld 108 (*)    BUN 4 (*)    All other components within normal limits  CBC  PRO B NATRIURETIC PEPTIDE  POCT I-STAT TROPONIN I   Imaging Review Dg Chest 2  View  02/07/2013   CLINICAL DATA:  Shortness of Breath, history of hypertension  EXAM: CHEST  2 VIEW  COMPARISON:  Prior radiograph from 02/03/2013  FINDINGS: The cardiac and mediastinal silhouettes are stable in size and contour, and remain within normal limits.  The lungs are normally inflated. No airspace consolidation, pleural effusion, or pulmonary edema is identified. There is no pneumothorax.  No acute osseous abnormality identified.  IMPRESSION: No active cardiopulmonary disease.   Electronically Signed   By: Jeannine Boga M.D.   On: 02/07/2013 22:06    EKG Interpretation    Date/Time:  Thursday February 07 2013 18:01:32 EST Ventricular Rate:  106 PR Interval:  132 QRS Duration: 86 QT Interval:  312 QTC Calculation: 414 R Axis:   69 Text Interpretation:  Sinus tachycardia Otherwise normal ECG No significant change since last tracing Confirmed by [facility]  MD, CHARLES [phone]) on 02/07/2013 10:30:58 PM            MDM   1. Atypical chest pain    28 year old male presents for left-sided chest pain that has been constant for 5 days. Patient is well and nontoxic appearing, hemodynamically stable, and afebrile. Patient has been seen and evaluated for similar complaints 4 days ago with a negative cardiac workup. D-dimer at this time was also negative.  Patient without any significant findings on cardiac workup today. His chest pain is atypical for ACS as it has been constant over the last 5 days and relieved with Valium. Of note, patient was given nitroglycerin 4 days ago when he was evaluated for the same pain with no relief of symptoms. Chest x-ray without evidence of pneumothorax, pleural effusion, mediastinal widening, or focal consolidation. Also doubt pulmonary embolism in this patient. Patient tachycardic on arrival, but heart rate has been trending down and he has been without tachypnea, dyspnea, or hypoxia throughout entirety of ED course.  Patient stable and appropriate  for discharge with cardiology followup. He has an appointment scheduled for an outpatient stress test and further evaluation of his symptoms on February 9th. Have advised patient to continue taking his prescribed medications including omeprazole, Advil, and Valium as needed. Return precautions provided. Patient agreeable to plan with no unaddressed concerns.   Antonietta Breach, PA-C 02/07/13 2240

## 2013-02-07 NOTE — ED Notes (Signed)
Pt reports having left side chest pains that started on Saturday. Went to [facility] and was told he had abnormal ekg and referred to cardiologist. Pt unable to get appt until feb 9th, chest pain has never stopped but is now radiating to mid and right side chest. Having sob and numbness sensation to left arm. ekg done. No acute distress noted at triage.

## 2013-02-07 NOTE — ED Notes (Signed)
Pt states he was at his PCP today who did an EKG which looked normal and they prescribed him valium. Pt states he took it this morning and once this afternoon which has helped his chest some. Pt sates that his PCP told him to keep his cardiology appt in February but to come to the ED if his symptoms do not get better.

## 2013-02-07 NOTE — Discharge Instructions (Signed)
Your workup today shows no evidence of a heart attack, fluid around your lungs, collapse of one or both of your lungs, pneumonia, or any other acute cardiopulmonary abnormality. Recommend you continue taking Valium as needed, prescribed by your primary care doctor, as well as ibuprofen for pain control. Follow up with a cardiologist at your scheduled appointment for an outpatient stress test. Return if symptoms worsen.  Chest Wall Pain Chest wall pain is pain in or around the bones and muscles of your chest. It may take up to 6 weeks to get better. It may take longer if you must stay physically active in your work and activities.  CAUSES  Chest wall pain may happen on its own. However, it may be caused by:  A viral illness like the flu.  Injury.  Coughing.  Exercise.  Arthritis.  Fibromyalgia.  Shingles. HOME CARE INSTRUCTIONS   Avoid overtiring physical activity. Try not to strain or perform activities that cause pain. This includes any activities using your chest or your abdominal and side muscles, especially if heavy weights are used.  Put ice on the sore area.  Put ice in a plastic bag.  Place a towel between your skin and the bag.  Leave the ice on for 15-20 minutes per hour while awake for the first 2 days.  Only take over-the-counter or prescription medicines for pain, discomfort, or fever as directed by your caregiver. SEEK IMMEDIATE MEDICAL CARE IF:   Your pain increases, or you are very uncomfortable.  You have a fever.  Your chest pain becomes worse.  You have new, unexplained symptoms.  You have nausea or vomiting.  You feel sweaty or lightheaded.  You have a cough with phlegm (sputum), or you cough up blood. MAKE SURE YOU:   Understand these instructions.  Will watch your condition.  Will get help right away if you are not doing well or get worse. Document Released: 01/03/2005 Document Revised: 03/28/2011 Document Reviewed: 08/30/2010 [facility]  Patient Information 2014 [place], [place].

## 2013-02-07 NOTE — ED Notes (Signed)
Pt back from x-ray.

## 2013-02-07 NOTE — ED Notes (Signed)
Pt taken to xray 

## 2013-02-08 NOTE — ED Provider Notes (Signed)
Medical screening examination/treatment/procedure(s) were performed by non-physician practitioner and as supervising physician I was immediately available for consultation/collaboration.  EKG Interpretation    Date/Time:  Thursday February 07 2013 18:01:32 EST Ventricular Rate:  106 PR Interval:  132 QRS Duration: 86 QT Interval:  312 QTC Calculation: 414 R Axis:   69 Text Interpretation:  Sinus tachycardia Otherwise normal ECG No significant change since last tracing Confirmed by [facility]  MD, CHARLES ([id number]) on 02/07/2013 10:30:58 PM              Juanda Crumble B. Karle Starch, MD 02/08/13 [id number]

## 2013-02-25 ENCOUNTER — Encounter: Payer: Medicare Other | Admitting: Cardiovascular Disease

## 2013-02-25 DIAGNOSIS — J342 Deviated nasal septum: Secondary | ICD-10-CM | POA: Diagnosis not present

## 2013-02-25 DIAGNOSIS — G4733 Obstructive sleep apnea (adult) (pediatric): Secondary | ICD-10-CM | POA: Diagnosis not present

## 2013-03-01 ENCOUNTER — Telehealth: Payer: Self-pay | Admitting: Pulmonary Disease

## 2013-03-01 NOTE — Telephone Encounter (Signed)
I reviewed Dr Noreene Filbert note -ENT He has not seen me in 2 years. He does have moderate OSA. I doubt UPPP will cure him. I am not sure if he has been able to use CPAP at all. Pl send this note to dr Erik Obey & ask pt if he wants to schedule appt with [place] to discuss

## 2013-03-01 NOTE — Telephone Encounter (Signed)
LMTCB x1 w/ spouse 

## 2013-03-04 NOTE — Telephone Encounter (Signed)
Pt returning call

## 2013-03-04 NOTE — Telephone Encounter (Signed)
lmomomtcb x2

## 2013-03-07 NOTE — Telephone Encounter (Signed)
Spoke with pt, scheduled appt for next available with RA.  Nothing further needed.

## 2013-03-09 DIAGNOSIS — R319 Hematuria, unspecified: Secondary | ICD-10-CM | POA: Diagnosis not present

## 2013-03-13 ENCOUNTER — Encounter: Payer: Medicare Other | Admitting: Cardiovascular Disease

## 2013-03-20 DIAGNOSIS — Z79899 Other long term (current) drug therapy: Secondary | ICD-10-CM | POA: Diagnosis not present

## 2013-03-20 DIAGNOSIS — F17200 Nicotine dependence, unspecified, uncomplicated: Secondary | ICD-10-CM | POA: Diagnosis not present

## 2013-03-20 DIAGNOSIS — I10 Essential (primary) hypertension: Secondary | ICD-10-CM | POA: Diagnosis not present

## 2013-03-20 DIAGNOSIS — Z888 Allergy status to other drugs, medicaments and biological substances status: Secondary | ICD-10-CM | POA: Diagnosis not present

## 2013-03-20 DIAGNOSIS — K219 Gastro-esophageal reflux disease without esophagitis: Secondary | ICD-10-CM | POA: Diagnosis not present

## 2013-03-20 DIAGNOSIS — R109 Unspecified abdominal pain: Secondary | ICD-10-CM | POA: Diagnosis not present

## 2013-03-20 DIAGNOSIS — N200 Calculus of kidney: Secondary | ICD-10-CM | POA: Diagnosis not present

## 2013-03-20 DIAGNOSIS — R197 Diarrhea, unspecified: Secondary | ICD-10-CM | POA: Diagnosis not present

## 2013-03-20 DIAGNOSIS — N201 Calculus of ureter: Secondary | ICD-10-CM | POA: Diagnosis not present

## 2013-03-20 DIAGNOSIS — R110 Nausea: Secondary | ICD-10-CM | POA: Diagnosis not present

## 2013-03-26 DIAGNOSIS — N200 Calculus of kidney: Secondary | ICD-10-CM | POA: Diagnosis not present

## 2013-03-30 DIAGNOSIS — N23 Unspecified renal colic: Secondary | ICD-10-CM | POA: Diagnosis not present

## 2013-03-30 DIAGNOSIS — Z888 Allergy status to other drugs, medicaments and biological substances status: Secondary | ICD-10-CM | POA: Diagnosis not present

## 2013-03-30 DIAGNOSIS — Z79899 Other long term (current) drug therapy: Secondary | ICD-10-CM | POA: Diagnosis not present

## 2013-03-30 DIAGNOSIS — R109 Unspecified abdominal pain: Secondary | ICD-10-CM | POA: Diagnosis not present

## 2013-03-30 DIAGNOSIS — F17200 Nicotine dependence, unspecified, uncomplicated: Secondary | ICD-10-CM | POA: Diagnosis not present

## 2013-03-30 DIAGNOSIS — I10 Essential (primary) hypertension: Secondary | ICD-10-CM | POA: Diagnosis not present

## 2013-03-30 DIAGNOSIS — N200 Calculus of kidney: Secondary | ICD-10-CM | POA: Diagnosis not present

## 2013-03-30 DIAGNOSIS — K219 Gastro-esophageal reflux disease without esophagitis: Secondary | ICD-10-CM | POA: Diagnosis not present

## 2013-04-04 ENCOUNTER — Ambulatory Visit: Payer: Medicare Other | Admitting: Pulmonary Disease

## 2013-04-08 ENCOUNTER — Ambulatory Visit: Payer: Medicare Other | Admitting: Gastroenterology

## 2013-04-09 ENCOUNTER — Encounter: Payer: Medicare Other | Admitting: Cardiovascular Disease

## 2013-04-09 DIAGNOSIS — N200 Calculus of kidney: Secondary | ICD-10-CM | POA: Diagnosis not present

## 2013-04-11 ENCOUNTER — Encounter: Payer: Medicare Other | Admitting: Cardiovascular Disease

## 2013-04-11 DIAGNOSIS — F319 Bipolar disorder, unspecified: Secondary | ICD-10-CM | POA: Diagnosis not present

## 2013-04-11 DIAGNOSIS — N201 Calculus of ureter: Secondary | ICD-10-CM | POA: Diagnosis not present

## 2013-04-11 DIAGNOSIS — N1330 Unspecified hydronephrosis: Secondary | ICD-10-CM | POA: Diagnosis not present

## 2013-04-11 DIAGNOSIS — I10 Essential (primary) hypertension: Secondary | ICD-10-CM | POA: Diagnosis not present

## 2013-04-11 DIAGNOSIS — Z9889 Other specified postprocedural states: Secondary | ICD-10-CM | POA: Diagnosis not present

## 2013-04-11 DIAGNOSIS — Z79899 Other long term (current) drug therapy: Secondary | ICD-10-CM | POA: Diagnosis not present

## 2013-04-11 DIAGNOSIS — F17200 Nicotine dependence, unspecified, uncomplicated: Secondary | ICD-10-CM | POA: Diagnosis not present

## 2013-04-11 DIAGNOSIS — Z886 Allergy status to analgesic agent status: Secondary | ICD-10-CM | POA: Diagnosis not present

## 2013-04-11 DIAGNOSIS — K219 Gastro-esophageal reflux disease without esophagitis: Secondary | ICD-10-CM | POA: Diagnosis not present

## 2013-04-11 DIAGNOSIS — Z888 Allergy status to other drugs, medicaments and biological substances status: Secondary | ICD-10-CM | POA: Diagnosis not present

## 2013-04-11 DIAGNOSIS — G4733 Obstructive sleep apnea (adult) (pediatric): Secondary | ICD-10-CM | POA: Diagnosis not present

## 2013-04-13 DIAGNOSIS — Z886 Allergy status to analgesic agent status: Secondary | ICD-10-CM | POA: Diagnosis not present

## 2013-04-13 DIAGNOSIS — Z888 Allergy status to other drugs, medicaments and biological substances status: Secondary | ICD-10-CM | POA: Diagnosis not present

## 2013-04-13 DIAGNOSIS — F17200 Nicotine dependence, unspecified, uncomplicated: Secondary | ICD-10-CM | POA: Diagnosis not present

## 2013-04-13 DIAGNOSIS — G8918 Other acute postprocedural pain: Secondary | ICD-10-CM | POA: Diagnosis not present

## 2013-04-13 DIAGNOSIS — K219 Gastro-esophageal reflux disease without esophagitis: Secondary | ICD-10-CM | POA: Diagnosis not present

## 2013-04-13 DIAGNOSIS — G4733 Obstructive sleep apnea (adult) (pediatric): Secondary | ICD-10-CM | POA: Diagnosis not present

## 2013-04-13 DIAGNOSIS — R319 Hematuria, unspecified: Secondary | ICD-10-CM | POA: Diagnosis not present

## 2013-04-13 DIAGNOSIS — Z87442 Personal history of urinary calculi: Secondary | ICD-10-CM | POA: Diagnosis not present

## 2013-04-13 DIAGNOSIS — Z79899 Other long term (current) drug therapy: Secondary | ICD-10-CM | POA: Diagnosis not present

## 2013-04-13 DIAGNOSIS — G43909 Migraine, unspecified, not intractable, without status migrainosus: Secondary | ICD-10-CM | POA: Diagnosis not present

## 2013-04-13 DIAGNOSIS — K5900 Constipation, unspecified: Secondary | ICD-10-CM | POA: Diagnosis not present

## 2013-04-13 DIAGNOSIS — R109 Unspecified abdominal pain: Secondary | ICD-10-CM | POA: Diagnosis not present

## 2013-04-14 ENCOUNTER — Ambulatory Visit (HOSPITAL_BASED_OUTPATIENT_CLINIC_OR_DEPARTMENT_OTHER): Payer: Medicare Other | Attending: Otolaryngology

## 2013-04-18 DIAGNOSIS — N200 Calculus of kidney: Secondary | ICD-10-CM | POA: Diagnosis not present

## 2013-05-07 ENCOUNTER — Encounter: Payer: Medicare Other | Admitting: Cardiovascular Disease

## 2013-05-09 ENCOUNTER — Encounter: Payer: Self-pay | Admitting: Cardiovascular Disease

## 2013-05-15 ENCOUNTER — Ambulatory Visit (HOSPITAL_BASED_OUTPATIENT_CLINIC_OR_DEPARTMENT_OTHER): Payer: Medicare Other | Attending: Otolaryngology | Admitting: Radiology

## 2013-05-15 VITALS — Ht 75.0 in | Wt 273.0 lb

## 2013-05-15 DIAGNOSIS — G4733 Obstructive sleep apnea (adult) (pediatric): Secondary | ICD-10-CM | POA: Insufficient documentation

## 2013-05-25 DIAGNOSIS — G4733 Obstructive sleep apnea (adult) (pediatric): Secondary | ICD-10-CM | POA: Diagnosis not present

## 2013-05-25 DIAGNOSIS — G4730 Sleep apnea, unspecified: Secondary | ICD-10-CM

## 2013-05-25 DIAGNOSIS — G4710 Hypersomnia, unspecified: Secondary | ICD-10-CM | POA: Diagnosis not present

## 2013-05-25 NOTE — Sleep Study (Signed)
   NAME: Marc Mayo DATE OF BIRTH:  July 05, 1985 MEDICAL RECORD NUMBER [id number]  [facility]  PHYSICIAN: Clinton D Young  DATE OF STUDY: 05/15/2013  SLEEP STUDY TYPE: Nocturnal Polysomnogram               REFERRING PHYSICIAN: Jodi Marble, MD  INDICATION FOR STUDY: Hypersomnia with sleep apnea  EPWORTH SLEEPINESS SCORE:   6/24 HEIGHT: 6\' 3"  (190.5 cm)  WEIGHT: 273 lb (123.832 kg)    Body mass index is 34.12 kg/(m^2).  NECK SIZE: 17 in.  MEDICATIONS: Charted for review  SLEEP ARCHITECTURE: Total sleep time 334.5 minutes with sleep efficiency 87.8%. Stage I was 4.9%, stage II 68%, stage III 0.1%, REM 26.9% of total sleep time. Sleep latency 25.5 minutes, REM latency 51 minutes, awake after sleep onset 20.5 minutes, arousal index 28.3. Bedtime medication: None  RESPIRATORY DATA: Apnea hypopnea index (AHI) 22.8 per hour. 127 total events scored including one obstructive apnea and 126 hypopneas. Most events were while supine. REM AHI 18.7 per hour. Almost all events developed after 2 AM, too late for split protocol CPAP titration.  OXYGEN DATA: Mild to moderate snoring with oxygen desaturation to a nadir of 84% and mean oxygen saturation through the study of 93.2% on room air.  CARDIAC DATA: Sinus rhythm with PACs  MOVEMENT/PARASOMNIA: No significant movement disturbance, no bathroom trips  IMPRESSION/ RECOMMENDATION:   1) Moderate obstructive sleep apnea/hypopnea syndrome, AHI 22.8 per hour. Most events were hypopneas associated with supine sleep position and REM. Mild to moderate snoring with oxygen desaturation to a nadir of 84% and mean oxygen saturation through the study of 93.2% on room air.  2) Most events developed after 2 AM, too late for CPAP titration. He can return for dedicated CPAP titration study if needed. 3) a previous diagnostic polysomnogram on 02/21/2011 recorded AHI 18 per hour with body weight 304 pounds.  Signed Baird Lyons  M.D. [place]  ELECTRONICALLY SIGNED ON:  05/25/2013, 12:18 PM [place] PH: [phone]   FX: [phone] [place]

## 2013-06-28 DIAGNOSIS — F17200 Nicotine dependence, unspecified, uncomplicated: Secondary | ICD-10-CM | POA: Diagnosis not present

## 2013-06-28 DIAGNOSIS — G43909 Migraine, unspecified, not intractable, without status migrainosus: Secondary | ICD-10-CM | POA: Diagnosis not present

## 2013-06-28 DIAGNOSIS — K219 Gastro-esophageal reflux disease without esophagitis: Secondary | ICD-10-CM | POA: Diagnosis not present

## 2013-06-28 DIAGNOSIS — Z888 Allergy status to other drugs, medicaments and biological substances status: Secondary | ICD-10-CM | POA: Diagnosis not present

## 2013-06-28 DIAGNOSIS — J42 Unspecified chronic bronchitis: Secondary | ICD-10-CM | POA: Diagnosis not present

## 2013-06-28 DIAGNOSIS — K089 Disorder of teeth and supporting structures, unspecified: Secondary | ICD-10-CM | POA: Diagnosis not present

## 2013-06-28 DIAGNOSIS — Z79899 Other long term (current) drug therapy: Secondary | ICD-10-CM | POA: Diagnosis not present

## 2013-07-12 DIAGNOSIS — F17200 Nicotine dependence, unspecified, uncomplicated: Secondary | ICD-10-CM | POA: Diagnosis not present

## 2013-07-12 DIAGNOSIS — R110 Nausea: Secondary | ICD-10-CM | POA: Diagnosis not present

## 2013-07-12 DIAGNOSIS — K219 Gastro-esophageal reflux disease without esophagitis: Secondary | ICD-10-CM | POA: Diagnosis not present

## 2013-07-12 DIAGNOSIS — R300 Dysuria: Secondary | ICD-10-CM | POA: Diagnosis not present

## 2013-07-12 DIAGNOSIS — Z886 Allergy status to analgesic agent status: Secondary | ICD-10-CM | POA: Diagnosis not present

## 2013-07-12 DIAGNOSIS — G4733 Obstructive sleep apnea (adult) (pediatric): Secondary | ICD-10-CM | POA: Diagnosis not present

## 2013-07-12 DIAGNOSIS — R109 Unspecified abdominal pain: Secondary | ICD-10-CM | POA: Diagnosis not present

## 2013-07-12 DIAGNOSIS — Z79899 Other long term (current) drug therapy: Secondary | ICD-10-CM | POA: Diagnosis not present

## 2013-07-12 DIAGNOSIS — Z87442 Personal history of urinary calculi: Secondary | ICD-10-CM | POA: Diagnosis not present

## 2013-07-12 DIAGNOSIS — Z888 Allergy status to other drugs, medicaments and biological substances status: Secondary | ICD-10-CM | POA: Diagnosis not present

## 2013-07-22 DIAGNOSIS — I10 Essential (primary) hypertension: Secondary | ICD-10-CM | POA: Diagnosis not present

## 2013-07-22 DIAGNOSIS — Z6834 Body mass index (BMI) 34.0-34.9, adult: Secondary | ICD-10-CM | POA: Diagnosis not present

## 2013-07-22 DIAGNOSIS — K219 Gastro-esophageal reflux disease without esophagitis: Secondary | ICD-10-CM | POA: Diagnosis not present

## 2013-07-22 DIAGNOSIS — J449 Chronic obstructive pulmonary disease, unspecified: Secondary | ICD-10-CM | POA: Diagnosis not present

## 2013-07-22 DIAGNOSIS — K089 Disorder of teeth and supporting structures, unspecified: Secondary | ICD-10-CM | POA: Diagnosis not present

## 2013-07-22 DIAGNOSIS — E669 Obesity, unspecified: Secondary | ICD-10-CM | POA: Diagnosis not present

## 2013-07-22 DIAGNOSIS — F17200 Nicotine dependence, unspecified, uncomplicated: Secondary | ICD-10-CM | POA: Diagnosis not present

## 2013-09-10 DIAGNOSIS — M545 Low back pain, unspecified: Secondary | ICD-10-CM | POA: Diagnosis not present

## 2013-09-10 DIAGNOSIS — D499 Neoplasm of unspecified behavior of unspecified site: Secondary | ICD-10-CM | POA: Diagnosis not present

## 2013-09-12 ENCOUNTER — Other Ambulatory Visit: Payer: Self-pay | Admitting: Family Medicine

## 2013-09-12 ENCOUNTER — Ambulatory Visit (INDEPENDENT_AMBULATORY_CARE_PROVIDER_SITE_OTHER): Payer: Medicare Other

## 2013-09-12 DIAGNOSIS — D499 Neoplasm of unspecified behavior of unspecified site: Secondary | ICD-10-CM

## 2013-09-12 DIAGNOSIS — M7989 Other specified soft tissue disorders: Secondary | ICD-10-CM | POA: Diagnosis not present

## 2013-09-12 DIAGNOSIS — M949 Disorder of cartilage, unspecified: Secondary | ICD-10-CM

## 2013-09-12 DIAGNOSIS — M899 Disorder of bone, unspecified: Secondary | ICD-10-CM

## 2013-10-01 DIAGNOSIS — M545 Low back pain, unspecified: Secondary | ICD-10-CM | POA: Diagnosis not present

## 2013-10-23 DIAGNOSIS — G4733 Obstructive sleep apnea (adult) (pediatric): Secondary | ICD-10-CM | POA: Diagnosis not present

## 2013-10-23 DIAGNOSIS — Z79899 Other long term (current) drug therapy: Secondary | ICD-10-CM | POA: Diagnosis not present

## 2013-10-23 DIAGNOSIS — M545 Low back pain: Secondary | ICD-10-CM | POA: Diagnosis not present

## 2013-10-23 DIAGNOSIS — Z9889 Other specified postprocedural states: Secondary | ICD-10-CM | POA: Diagnosis not present

## 2013-10-23 DIAGNOSIS — R1031 Right lower quadrant pain: Secondary | ICD-10-CM | POA: Diagnosis not present

## 2013-10-23 DIAGNOSIS — F17210 Nicotine dependence, cigarettes, uncomplicated: Secondary | ICD-10-CM | POA: Diagnosis not present

## 2013-10-23 DIAGNOSIS — Z888 Allergy status to other drugs, medicaments and biological substances status: Secondary | ICD-10-CM | POA: Diagnosis not present

## 2013-10-23 DIAGNOSIS — R109 Unspecified abdominal pain: Secondary | ICD-10-CM | POA: Diagnosis not present

## 2013-10-23 DIAGNOSIS — Z886 Allergy status to analgesic agent status: Secondary | ICD-10-CM | POA: Diagnosis not present

## 2013-10-23 DIAGNOSIS — J42 Unspecified chronic bronchitis: Secondary | ICD-10-CM | POA: Diagnosis not present

## 2013-10-23 DIAGNOSIS — K219 Gastro-esophageal reflux disease without esophagitis: Secondary | ICD-10-CM | POA: Diagnosis not present

## 2013-10-23 DIAGNOSIS — Z87442 Personal history of urinary calculi: Secondary | ICD-10-CM | POA: Diagnosis not present

## 2013-11-01 ENCOUNTER — Emergency Department (HOSPITAL_BASED_OUTPATIENT_CLINIC_OR_DEPARTMENT_OTHER)
Admission: EM | Admit: 2013-11-01 | Discharge: 2013-11-01 | Disposition: A | Discharge status: Home / Self Care | Payer: Medicare Other | Attending: Emergency Medicine | Admitting: Emergency Medicine

## 2013-11-01 ENCOUNTER — Encounter (HOSPITAL_BASED_OUTPATIENT_CLINIC_OR_DEPARTMENT_OTHER): Payer: Self-pay | Admitting: Emergency Medicine

## 2013-11-01 DIAGNOSIS — Z720 Tobacco use: Secondary | ICD-10-CM | POA: Insufficient documentation

## 2013-11-01 DIAGNOSIS — R1013 Epigastric pain: Secondary | ICD-10-CM | POA: Insufficient documentation

## 2013-11-01 DIAGNOSIS — R109 Unspecified abdominal pain: Secondary | ICD-10-CM | POA: Diagnosis present

## 2013-11-01 DIAGNOSIS — G8929 Other chronic pain: Secondary | ICD-10-CM | POA: Diagnosis not present

## 2013-11-01 DIAGNOSIS — Z79899 Other long term (current) drug therapy: Secondary | ICD-10-CM | POA: Insufficient documentation

## 2013-11-01 DIAGNOSIS — Z8659 Personal history of other mental and behavioral disorders: Secondary | ICD-10-CM | POA: Diagnosis not present

## 2013-11-01 DIAGNOSIS — K219 Gastro-esophageal reflux disease without esophagitis: Secondary | ICD-10-CM | POA: Insufficient documentation

## 2013-11-01 LAB — COMPREHENSIVE METABOLIC PANEL
ALK PHOS: 64 U/L (ref 39–117)
ALT: 21 U/L (ref 0–53)
AST: 15 U/L (ref 0–37)
Albumin: 3.8 g/dL (ref 3.5–5.2)
Anion gap: 11 (ref 5–15)
BILIRUBIN TOTAL: 0.3 mg/dL (ref 0.3–1.2)
BUN: 5 mg/dL — ABNORMAL LOW (ref 6–23)
CALCIUM: 9.4 mg/dL (ref 8.4–10.5)
CO2: 26 mEq/L (ref 19–32)
Chloride: 105 mEq/L (ref 96–112)
Creatinine, Ser: 0.7 mg/dL (ref 0.50–1.35)
GFR calc Af Amer: >90 mL/min (ref >90)
GFR calc non Af Amer: >90 mL/min (ref >90)
GLUCOSE: 87 mg/dL (ref 70–99)
POTASSIUM: 4.5 meq/L (ref 3.7–5.3)
Sodium: 142 mEq/L (ref 137–147)
Total Protein: 7.1 g/dL (ref 6.0–8.3)

## 2013-11-01 LAB — CBC WITH DIFFERENTIAL/PLATELET
Basophils Absolute: 0.1 10*3/uL (ref 0.0–0.1)
Basophils Relative: 1 % (ref 0–1)
EOS PCT: 6 % — AB (ref 0–5)
Eosinophils Absolute: 0.5 10*3/uL (ref 0.0–0.7)
HCT: 45 % (ref 39.0–52.0)
Hemoglobin: 15.8 g/dL (ref 13.0–17.0)
Lymphocytes Relative: 31 % (ref 12–46)
Lymphs Abs: 2.6 10*3/uL (ref 0.7–4.0)
MCH: 31.2 pg (ref 26.0–34.0)
MCHC: 35.1 g/dL (ref 30.0–36.0)
MCV: 88.8 fL (ref 78.0–100.0)
Monocytes Absolute: 0.7 10*3/uL (ref 0.1–1.0)
Monocytes Relative: 8 % (ref 3–12)
Neutro Abs: 4.8 10*3/uL (ref 1.7–7.7)
Neutrophils Relative %: 54 % (ref 43–77)
Platelets: 222 10*3/uL (ref 150–400)
RBC: 5.07 MIL/uL (ref 4.22–5.81)
RDW: 13 % (ref 11.5–15.5)
WBC: 8.6 10*3/uL (ref 4.0–10.5)

## 2013-11-01 LAB — URINALYSIS, ROUTINE W REFLEX MICROSCOPIC
Bilirubin Urine: NEGATIVE
GLUCOSE, UA: NEGATIVE mg/dL
HGB URINE DIPSTICK: NEGATIVE
KETONES UR: NEGATIVE mg/dL
Leukocytes, UA: NEGATIVE
Nitrite: NEGATIVE
PROTEIN: NEGATIVE mg/dL
Specific Gravity, Urine: 1.005 (ref 1.005–1.030)
Urobilinogen, UA: 0.2 mg/dL (ref 0.0–1.0)
pH: 7 (ref 5.0–8.0)

## 2013-11-01 LAB — LIPASE, BLOOD: Lipase: 37 U/L (ref 11–59)

## 2013-11-01 MED ORDER — OXYCODONE-ACETAMINOPHEN 5-325 MG PO TABS
1.0000 | ORAL_TABLET | Freq: Four times a day (QID) | ORAL | Status: DC | PRN
Start: 2013-11-01 — End: 2016-01-08

## 2013-11-01 MED ORDER — SODIUM CHLORIDE 0.9 % IV BOLUS (SEPSIS)
1000.0000 mL | Freq: Once | INTRAVENOUS | Status: AC
  Administered 2013-11-01: 1000 mL via INTRAVENOUS

## 2013-11-01 MED ORDER — ONDANSETRON HCL 4 MG/2ML IJ SOLN
4.0000 mg | Freq: Once | INTRAMUSCULAR | Status: AC
  Administered 2013-11-01: 4 mg via INTRAVENOUS
  Filled 2013-11-01: qty 2

## 2013-11-01 MED ORDER — MORPHINE SULFATE 4 MG/ML IJ SOLN
4.0000 mg | Freq: Once | INTRAMUSCULAR | Status: AC
  Administered 2013-11-01: 4 mg via INTRAVENOUS
  Filled 2013-11-01: qty 1

## 2013-11-01 NOTE — ED Notes (Signed)
RUQ pain x 1 week, worse with eating greasy/spicy foods.  Vomited x 2 in the past 24 hours.

## 2013-11-01 NOTE — ED Provider Notes (Signed)
CSN: [id number]     Arrival date & time 11/01/13  0915 History   First MD Initiated Contact with Patient 11/01/13 1013     Chief Complaint  Patient presents with  . Abdominal Pain     (Consider location/radiation/quality/duration/timing/severity/associated sxs/prior Treatment) HPI Pt presenting with c/o upper and right sided abdominal pain.  Pt states that pain has been present intermittently over the past 1 week, worse with eating greasy or spicy foods.  Has had vomiting x 2 in the past day.  Emesis nonbloody and nonbilious.  No change in bowel movements.  Has not had similar pain in the past.  Has continued to be able to keep down liquids,  He does have hx of GERD but states that this does not feel like his GERD.  There are no other associated systemic symptoms, there are no other alleviating or modifying factors.   Past Medical History  Diagnosis Date  . ADHD (attention deficit hyperactivity disorder)   . Bipolar affective disorder   . GERD (gastroesophageal reflux disease)   . Headache(784.0)     migraines  . History of gastric ulcer   . Fatty liver   . Chronic back pain   . Chronic eustachian tube dysfunction 11/2011  . Sleep apnea     was taken off machine as breathing has gotten better  . IBS (irritable bowel syndrome)    Past Surgical History  Procedure Laterality Date  . Tympanostomy tube placement      x 7  . Myringotomy with tube placement  12/12/2011    Procedure: MYRINGOTOMY WITH TUBE PLACEMENT;  Surgeon: Jodi Marble, MD;  Location: [facility];  Service: ENT;  Laterality: Bilateral;  Bilateral T Tubes  . Nasal endoscopy with epistaxis control  12/12/2011    Procedure: NASAL ENDOSCOPY WITH EPISTAXIS CONTROL;  Surgeon: Jodi Marble, MD;  Location: [place];  Service: ENT;  Laterality: Bilateral;  Direct Nasal Endoscopy with Cautery of Eustachian Tube Tori, Bilateral  . Esophagogastroduodenoscopy  2011    Dr. Oneida Alar: chronic  non-H.pylori gastritis, normal small bowel biopsy    Family History  Problem Relation Age of Onset  . Colon cancer Neg Hx    History  Substance Use Topics  . Smoking status: Current Every Day Smoker -- 0.50 packs/day for 11 years    Types: Cigarettes  . Smokeless tobacco: Never Used  . Alcohol Use: No    Review of Systems ROS reviewed and all otherwise negative except for mentioned in HPI    Allergies  Alprazolam; Sertraline hcl; Celexa; Tramadol; Ketorolac tromethamine; Lorazepam; Paroxetine hcl; and Prozac  Home Medications   Prior to Admission medications   Medication Sig Start Date End Date Taking? Authorizing Provider  budesonide-formoterol (SYMBICORT) 160-4.5 MCG/ACT inhaler Inhale 2 puffs into the lungs 2 (two) times daily.    Historical Provider, MD  diazepam (VALIUM) 5 MG tablet Take 5 mg by mouth every 8 (eight) hours as needed for anxiety.    Historical Provider, MD  ibuprofen (ADVIL,MOTRIN) 200 MG tablet Take 400-800 mg by mouth every 6 (six) hours as needed.    Historical Provider, MD  omeprazole (PRILOSEC) 20 MG capsule Take 20 mg by mouth daily.    Historical Provider, MD  oxyCODONE-acetaminophen (PERCOCET/ROXICET) 5-325 MG per tablet Take 1-2 tablets by mouth every 6 (six) hours as needed for severe pain. 11/01/13   Threasa Beards, MD   BP 141/90  Pulse 79  Temp(Src) 97.7 F (36.5 C) (Oral)  Resp  18  Ht 6\' 3"  (1.905 m)  Wt 276 lb (125.193 kg)  BMI 34.50 kg/m2  SpO2 100% Vitals reviewed Physical Exam Physical Examination: General appearance - alert, well appearing, and in no distress Mental status - alert, oriented to person, place, and time Eyes - no conjunctival injection, no scleral icterus Mouth - mucous membranes moist, pharynx normal without lesions Chest - clear to auscultation, no wheezes, rales or rhonchi, symmetric air entry Heart - normal rate, regular rhythm, normal S1, S2, no murmurs, rubs, clicks or gallops Abdomen - soft, mild epigastric  tenderness to palpation, no gaurding or rebound, nondistended, no masses or organomegaly, nabs Extremities - peripheral pulses normal, no pedal edema, no clubbing or cyanosis Skin - normal coloration and turgor, no rashes  ED Course  Procedures (including critical care time) Labs Review Labs Reviewed  CBC WITH DIFFERENTIAL - Abnormal; Notable for the following:    Eosinophils Relative 6 (*)    All other components within normal limits  COMPREHENSIVE METABOLIC PANEL - Abnormal; Notable for the following:    BUN 5 (*)    All other components within normal limits  LIPASE, BLOOD  URINALYSIS, ROUTINE W REFLEX MICROSCOPIC    Imaging Review No results found.   EKG Interpretation None      MDM   Final diagnoses:  Epigastric abdominal pain    Pt presenting with c/o epigastric and right upper abdominal pain.  Labs are reassuring.  Pt treated with pain, nausea meds and IV hydration.  Encouraged patient to arrange for an outpatient abdominal ultrasound, not indicated on an emergent basis here in the ED.  Pt verbalized understanding and states he will call his doctor's office this afternoon.  Discharged with strict return precautions.  Pt agreeable with plan.  Prior records reviewed and considered during this visit Nursing notes including past medical history and social history reviewed and considered in documentation     Threasa Beards, MD 11/01/13 1337

## 2013-11-01 NOTE — ED Notes (Signed)
MD at bedside. 

## 2013-11-01 NOTE — Discharge Instructions (Signed)
Return to the ED with any concerns including worsening pain, vomiting and not able to keep down liquids, fever/chills, decreased level of alertness/lethargy, or any other alarming symptoms  Your labs were normal in the ED today, the next step is to have an ultrasound of your abdomen done to evaluate for gallstones.  This should be arranged through your primary care doctor, please call your doctor to get a follow up appointment scheduled.

## 2013-11-04 ENCOUNTER — Other Ambulatory Visit: Payer: Self-pay | Admitting: Family Medicine

## 2013-11-04 DIAGNOSIS — R1011 Right upper quadrant pain: Secondary | ICD-10-CM

## 2013-11-08 ENCOUNTER — Ambulatory Visit (INDEPENDENT_AMBULATORY_CARE_PROVIDER_SITE_OTHER): Payer: Medicare Other

## 2013-11-08 DIAGNOSIS — K7689 Other specified diseases of liver: Secondary | ICD-10-CM | POA: Diagnosis not present

## 2013-11-08 DIAGNOSIS — R1011 Right upper quadrant pain: Secondary | ICD-10-CM | POA: Diagnosis not present

## 2013-11-13 DIAGNOSIS — R1013 Epigastric pain: Secondary | ICD-10-CM | POA: Diagnosis not present

## 2013-11-13 DIAGNOSIS — K920 Hematemesis: Secondary | ICD-10-CM | POA: Diagnosis not present

## 2013-11-29 DIAGNOSIS — K219 Gastro-esophageal reflux disease without esophagitis: Secondary | ICD-10-CM | POA: Diagnosis not present

## 2013-11-29 DIAGNOSIS — Z79899 Other long term (current) drug therapy: Secondary | ICD-10-CM | POA: Diagnosis not present

## 2013-11-29 DIAGNOSIS — Z7951 Long term (current) use of inhaled steroids: Secondary | ICD-10-CM | POA: Diagnosis not present

## 2013-11-29 DIAGNOSIS — K2970 Gastritis, unspecified, without bleeding: Secondary | ICD-10-CM | POA: Diagnosis not present

## 2013-11-29 DIAGNOSIS — G4733 Obstructive sleep apnea (adult) (pediatric): Secondary | ICD-10-CM | POA: Diagnosis not present

## 2013-11-29 DIAGNOSIS — J42 Unspecified chronic bronchitis: Secondary | ICD-10-CM | POA: Diagnosis not present

## 2013-11-29 DIAGNOSIS — F17210 Nicotine dependence, cigarettes, uncomplicated: Secondary | ICD-10-CM | POA: Diagnosis not present

## 2013-11-29 DIAGNOSIS — Z87442 Personal history of urinary calculi: Secondary | ICD-10-CM | POA: Diagnosis not present

## 2013-11-29 DIAGNOSIS — Z888 Allergy status to other drugs, medicaments and biological substances status: Secondary | ICD-10-CM | POA: Diagnosis not present

## 2013-11-29 DIAGNOSIS — Z886 Allergy status to analgesic agent status: Secondary | ICD-10-CM | POA: Diagnosis not present

## 2013-11-29 DIAGNOSIS — K920 Hematemesis: Secondary | ICD-10-CM | POA: Diagnosis not present

## 2013-12-02 DIAGNOSIS — K920 Hematemesis: Secondary | ICD-10-CM | POA: Diagnosis not present

## 2013-12-02 DIAGNOSIS — R112 Nausea with vomiting, unspecified: Secondary | ICD-10-CM | POA: Diagnosis not present

## 2013-12-02 DIAGNOSIS — K219 Gastro-esophageal reflux disease without esophagitis: Secondary | ICD-10-CM | POA: Diagnosis not present

## 2013-12-02 DIAGNOSIS — R1013 Epigastric pain: Secondary | ICD-10-CM | POA: Diagnosis not present

## 2013-12-03 DIAGNOSIS — L539 Erythematous condition, unspecified: Secondary | ICD-10-CM | POA: Diagnosis not present

## 2013-12-03 DIAGNOSIS — R1013 Epigastric pain: Secondary | ICD-10-CM | POA: Diagnosis not present

## 2013-12-03 DIAGNOSIS — K920 Hematemesis: Secondary | ICD-10-CM | POA: Diagnosis not present

## 2013-12-03 DIAGNOSIS — R112 Nausea with vomiting, unspecified: Secondary | ICD-10-CM | POA: Diagnosis not present

## 2014-01-25 DIAGNOSIS — Z888 Allergy status to other drugs, medicaments and biological substances status: Secondary | ICD-10-CM | POA: Diagnosis not present

## 2014-01-25 DIAGNOSIS — Z885 Allergy status to narcotic agent status: Secondary | ICD-10-CM | POA: Diagnosis not present

## 2014-01-25 DIAGNOSIS — Z7951 Long term (current) use of inhaled steroids: Secondary | ICD-10-CM | POA: Diagnosis not present

## 2014-01-25 DIAGNOSIS — Z79899 Other long term (current) drug therapy: Secondary | ICD-10-CM | POA: Diagnosis not present

## 2014-01-25 DIAGNOSIS — R509 Fever, unspecified: Secondary | ICD-10-CM | POA: Diagnosis not present

## 2014-01-25 DIAGNOSIS — K219 Gastro-esophageal reflux disease without esophagitis: Secondary | ICD-10-CM | POA: Diagnosis not present

## 2014-01-25 DIAGNOSIS — Z79891 Long term (current) use of opiate analgesic: Secondary | ICD-10-CM | POA: Diagnosis not present

## 2014-01-25 DIAGNOSIS — R079 Chest pain, unspecified: Secondary | ICD-10-CM | POA: Diagnosis not present

## 2014-01-25 DIAGNOSIS — F17210 Nicotine dependence, cigarettes, uncomplicated: Secondary | ICD-10-CM | POA: Diagnosis not present

## 2014-02-19 DIAGNOSIS — I10 Essential (primary) hypertension: Secondary | ICD-10-CM | POA: Diagnosis not present

## 2014-02-19 DIAGNOSIS — N644 Mastodynia: Secondary | ICD-10-CM | POA: Diagnosis not present

## 2014-02-21 ENCOUNTER — Other Ambulatory Visit: Payer: Self-pay | Admitting: Family Medicine

## 2014-02-21 DIAGNOSIS — N644 Mastodynia: Secondary | ICD-10-CM

## 2014-02-28 ENCOUNTER — Ambulatory Visit
Admission: RE | Admit: 2014-02-28 | Discharge: 2014-02-28 | Disposition: A | Discharge status: Home / Self Care | Payer: Medicare Other | Source: Ambulatory Visit | Attending: Family Medicine | Admitting: Family Medicine

## 2014-02-28 DIAGNOSIS — N644 Mastodynia: Secondary | ICD-10-CM | POA: Diagnosis not present

## 2014-03-11 DIAGNOSIS — I10 Essential (primary) hypertension: Secondary | ICD-10-CM | POA: Diagnosis not present

## 2014-03-11 DIAGNOSIS — R0781 Pleurodynia: Secondary | ICD-10-CM | POA: Diagnosis not present

## 2014-03-11 DIAGNOSIS — J45909 Unspecified asthma, uncomplicated: Secondary | ICD-10-CM | POA: Diagnosis not present

## 2014-03-11 DIAGNOSIS — F17200 Nicotine dependence, unspecified, uncomplicated: Secondary | ICD-10-CM | POA: Diagnosis not present

## 2014-04-25 DIAGNOSIS — G44209 Tension-type headache, unspecified, not intractable: Secondary | ICD-10-CM | POA: Diagnosis not present

## 2014-04-25 DIAGNOSIS — M549 Dorsalgia, unspecified: Secondary | ICD-10-CM | POA: Diagnosis not present

## 2014-04-25 DIAGNOSIS — I10 Essential (primary) hypertension: Secondary | ICD-10-CM | POA: Diagnosis not present

## 2014-04-25 DIAGNOSIS — K219 Gastro-esophageal reflux disease without esophagitis: Secondary | ICD-10-CM | POA: Diagnosis not present

## 2014-08-21 DIAGNOSIS — R1011 Right upper quadrant pain: Secondary | ICD-10-CM | POA: Diagnosis not present

## 2014-08-21 DIAGNOSIS — I10 Essential (primary) hypertension: Secondary | ICD-10-CM | POA: Diagnosis not present

## 2014-08-25 ENCOUNTER — Other Ambulatory Visit: Payer: Self-pay | Admitting: Physician Assistant

## 2014-08-25 DIAGNOSIS — R1011 Right upper quadrant pain: Secondary | ICD-10-CM

## 2014-08-27 ENCOUNTER — Other Ambulatory Visit: Payer: Medicare Other

## 2014-08-28 ENCOUNTER — Other Ambulatory Visit: Payer: Medicare Other

## 2014-09-01 ENCOUNTER — Ambulatory Visit (INDEPENDENT_AMBULATORY_CARE_PROVIDER_SITE_OTHER): Payer: Medicare Other

## 2014-09-01 DIAGNOSIS — R1011 Right upper quadrant pain: Secondary | ICD-10-CM | POA: Diagnosis not present

## 2014-09-01 DIAGNOSIS — K760 Fatty (change of) liver, not elsewhere classified: Secondary | ICD-10-CM | POA: Diagnosis not present

## 2014-09-19 DIAGNOSIS — I10 Essential (primary) hypertension: Secondary | ICD-10-CM | POA: Diagnosis not present

## 2014-10-08 DIAGNOSIS — Z3009 Encounter for other general counseling and advice on contraception: Secondary | ICD-10-CM | POA: Diagnosis not present

## 2014-10-24 DIAGNOSIS — Z23 Encounter for immunization: Secondary | ICD-10-CM | POA: Diagnosis not present

## 2014-11-10 DIAGNOSIS — Z302 Encounter for sterilization: Secondary | ICD-10-CM | POA: Diagnosis not present

## 2015-01-05 DIAGNOSIS — F909 Attention-deficit hyperactivity disorder, unspecified type: Secondary | ICD-10-CM | POA: Diagnosis not present

## 2015-01-05 DIAGNOSIS — R0781 Pleurodynia: Secondary | ICD-10-CM | POA: Diagnosis not present

## 2015-01-05 DIAGNOSIS — I10 Essential (primary) hypertension: Secondary | ICD-10-CM | POA: Diagnosis not present

## 2015-01-05 DIAGNOSIS — J45909 Unspecified asthma, uncomplicated: Secondary | ICD-10-CM | POA: Diagnosis not present

## 2015-01-05 DIAGNOSIS — F319 Bipolar disorder, unspecified: Secondary | ICD-10-CM | POA: Diagnosis not present

## 2015-01-05 DIAGNOSIS — M79604 Pain in right leg: Secondary | ICD-10-CM | POA: Diagnosis not present

## 2015-01-05 DIAGNOSIS — K219 Gastro-esophageal reflux disease without esophagitis: Secondary | ICD-10-CM | POA: Diagnosis not present

## 2015-01-13 ENCOUNTER — Other Ambulatory Visit: Payer: Self-pay | Admitting: Family Medicine

## 2015-01-13 DIAGNOSIS — I8391 Asymptomatic varicose veins of right lower extremity: Secondary | ICD-10-CM

## 2015-02-03 ENCOUNTER — Other Ambulatory Visit: Payer: Medicare Other

## 2015-02-17 ENCOUNTER — Other Ambulatory Visit: Payer: Medicare Other

## 2015-03-04 ENCOUNTER — Other Ambulatory Visit: Payer: Medicare Other

## 2015-03-12 ENCOUNTER — Other Ambulatory Visit: Payer: Medicare Other

## 2015-03-20 DIAGNOSIS — R6883 Chills (without fever): Secondary | ICD-10-CM | POA: Diagnosis not present

## 2015-03-20 DIAGNOSIS — J9801 Acute bronchospasm: Secondary | ICD-10-CM | POA: Diagnosis not present

## 2015-03-20 DIAGNOSIS — R05 Cough: Secondary | ICD-10-CM | POA: Diagnosis not present

## 2015-04-01 DIAGNOSIS — Z0271 Encounter for disability determination: Secondary | ICD-10-CM | POA: Diagnosis not present

## 2015-04-01 DIAGNOSIS — J45909 Unspecified asthma, uncomplicated: Secondary | ICD-10-CM | POA: Diagnosis not present

## 2015-04-01 DIAGNOSIS — Z6835 Body mass index (BMI) 35.0-35.9, adult: Secondary | ICD-10-CM | POA: Diagnosis not present

## 2015-04-01 DIAGNOSIS — I10 Essential (primary) hypertension: Secondary | ICD-10-CM | POA: Diagnosis not present

## 2015-04-01 DIAGNOSIS — J309 Allergic rhinitis, unspecified: Secondary | ICD-10-CM | POA: Diagnosis not present

## 2015-04-01 DIAGNOSIS — F319 Bipolar disorder, unspecified: Secondary | ICD-10-CM | POA: Diagnosis not present

## 2015-04-01 DIAGNOSIS — K219 Gastro-esophageal reflux disease without esophagitis: Secondary | ICD-10-CM | POA: Diagnosis not present

## 2015-06-01 ENCOUNTER — Other Ambulatory Visit (HOSPITAL_COMMUNITY): Payer: Self-pay | Admitting: Respiratory Therapy

## 2015-06-01 DIAGNOSIS — J441 Chronic obstructive pulmonary disease with (acute) exacerbation: Secondary | ICD-10-CM

## 2015-06-01 DIAGNOSIS — J449 Chronic obstructive pulmonary disease, unspecified: Secondary | ICD-10-CM | POA: Diagnosis not present

## 2015-06-01 DIAGNOSIS — I10 Essential (primary) hypertension: Secondary | ICD-10-CM | POA: Diagnosis not present

## 2015-06-01 DIAGNOSIS — F17200 Nicotine dependence, unspecified, uncomplicated: Secondary | ICD-10-CM | POA: Diagnosis not present

## 2015-07-30 DIAGNOSIS — S62332B Displaced fracture of neck of third metacarpal bone, right hand, initial encounter for open fracture: Secondary | ICD-10-CM | POA: Diagnosis not present

## 2015-07-30 DIAGNOSIS — M79641 Pain in right hand: Secondary | ICD-10-CM | POA: Diagnosis not present

## 2015-08-03 DIAGNOSIS — S62332B Displaced fracture of neck of third metacarpal bone, right hand, initial encounter for open fracture: Secondary | ICD-10-CM | POA: Diagnosis not present

## 2015-08-07 DIAGNOSIS — S62332D Displaced fracture of neck of third metacarpal bone, right hand, subsequent encounter for fracture with routine healing: Secondary | ICD-10-CM | POA: Diagnosis not present

## 2015-08-10 DIAGNOSIS — W2209XA Striking against other stationary object, initial encounter: Secondary | ICD-10-CM | POA: Diagnosis not present

## 2015-08-10 DIAGNOSIS — S62332A Displaced fracture of neck of third metacarpal bone, right hand, initial encounter for closed fracture: Secondary | ICD-10-CM | POA: Diagnosis not present

## 2015-08-10 HISTORY — PX: FINGER FRACTURE SURGERY: SHX638

## 2015-08-17 DIAGNOSIS — S62332D Displaced fracture of neck of third metacarpal bone, right hand, subsequent encounter for fracture with routine healing: Secondary | ICD-10-CM | POA: Diagnosis not present

## 2015-08-17 DIAGNOSIS — M79644 Pain in right finger(s): Secondary | ICD-10-CM | POA: Diagnosis not present

## 2015-08-25 DIAGNOSIS — S62332D Displaced fracture of neck of third metacarpal bone, right hand, subsequent encounter for fracture with routine healing: Secondary | ICD-10-CM | POA: Diagnosis not present

## 2015-08-29 DIAGNOSIS — K219 Gastro-esophageal reflux disease without esophagitis: Secondary | ICD-10-CM | POA: Diagnosis not present

## 2015-08-29 DIAGNOSIS — R2231 Localized swelling, mass and lump, right upper limb: Secondary | ICD-10-CM | POA: Diagnosis not present

## 2015-08-29 DIAGNOSIS — Z8709 Personal history of other diseases of the respiratory system: Secondary | ICD-10-CM | POA: Diagnosis not present

## 2015-08-29 DIAGNOSIS — Z888 Allergy status to other drugs, medicaments and biological substances status: Secondary | ICD-10-CM | POA: Diagnosis not present

## 2015-08-29 DIAGNOSIS — Z79899 Other long term (current) drug therapy: Secondary | ICD-10-CM | POA: Diagnosis not present

## 2015-08-29 DIAGNOSIS — Z87442 Personal history of urinary calculi: Secondary | ICD-10-CM | POA: Diagnosis not present

## 2015-08-29 DIAGNOSIS — G43909 Migraine, unspecified, not intractable, without status migrainosus: Secondary | ICD-10-CM | POA: Diagnosis not present

## 2015-08-29 DIAGNOSIS — G4733 Obstructive sleep apnea (adult) (pediatric): Secondary | ICD-10-CM | POA: Diagnosis not present

## 2015-08-29 DIAGNOSIS — F17210 Nicotine dependence, cigarettes, uncomplicated: Secondary | ICD-10-CM | POA: Diagnosis not present

## 2015-08-29 DIAGNOSIS — F519 Sleep disorder not due to a substance or known physiological condition, unspecified: Secondary | ICD-10-CM | POA: Diagnosis not present

## 2015-08-29 DIAGNOSIS — M79641 Pain in right hand: Secondary | ICD-10-CM | POA: Diagnosis not present

## 2015-09-04 DIAGNOSIS — S62332D Displaced fracture of neck of third metacarpal bone, right hand, subsequent encounter for fracture with routine healing: Secondary | ICD-10-CM | POA: Diagnosis not present

## 2015-11-18 DIAGNOSIS — R300 Dysuria: Secondary | ICD-10-CM | POA: Diagnosis not present

## 2015-11-18 DIAGNOSIS — N390 Urinary tract infection, site not specified: Secondary | ICD-10-CM | POA: Diagnosis not present

## 2015-11-18 DIAGNOSIS — N200 Calculus of kidney: Secondary | ICD-10-CM | POA: Diagnosis not present

## 2015-11-18 DIAGNOSIS — R319 Hematuria, unspecified: Secondary | ICD-10-CM | POA: Diagnosis not present

## 2015-11-23 ENCOUNTER — Other Ambulatory Visit: Payer: Self-pay | Admitting: Family Medicine

## 2015-11-23 DIAGNOSIS — Z87442 Personal history of urinary calculi: Secondary | ICD-10-CM

## 2015-11-23 DIAGNOSIS — R109 Unspecified abdominal pain: Secondary | ICD-10-CM | POA: Diagnosis not present

## 2015-11-23 DIAGNOSIS — M549 Dorsalgia, unspecified: Secondary | ICD-10-CM | POA: Diagnosis not present

## 2015-11-23 DIAGNOSIS — R319 Hematuria, unspecified: Secondary | ICD-10-CM

## 2015-11-24 ENCOUNTER — Ambulatory Visit (HOSPITAL_BASED_OUTPATIENT_CLINIC_OR_DEPARTMENT_OTHER)
Admission: RE | Admit: 2015-11-24 | Discharge: 2015-11-24 | Disposition: A | Discharge status: Home / Self Care | Payer: Medicare Other | Source: Ambulatory Visit | Attending: Family Medicine | Admitting: Family Medicine

## 2015-11-24 DIAGNOSIS — Z87442 Personal history of urinary calculi: Secondary | ICD-10-CM | POA: Insufficient documentation

## 2015-11-24 DIAGNOSIS — R319 Hematuria, unspecified: Secondary | ICD-10-CM | POA: Diagnosis not present

## 2015-11-24 MED FILL — HYDROCODON-APAP 5-325: 5-325 | 7 days supply | Qty: 20 | Fill #0

## 2015-12-18 DIAGNOSIS — M549 Dorsalgia, unspecified: Secondary | ICD-10-CM | POA: Diagnosis not present

## 2015-12-18 DIAGNOSIS — I10 Essential (primary) hypertension: Secondary | ICD-10-CM | POA: Diagnosis not present

## 2016-01-08 ENCOUNTER — Encounter (INDEPENDENT_AMBULATORY_CARE_PROVIDER_SITE_OTHER): Payer: Self-pay

## 2016-01-08 ENCOUNTER — Encounter: Payer: Self-pay | Admitting: Pediatrics

## 2016-01-08 ENCOUNTER — Ambulatory Visit (INDEPENDENT_AMBULATORY_CARE_PROVIDER_SITE_OTHER): Payer: Medicare Other | Admitting: Pediatrics

## 2016-01-08 VITALS — BP 114/77 | HR 82 | Temp 98.8°F | Ht 75.0 in | Wt 294.0 lb

## 2016-01-08 DIAGNOSIS — R0600 Dyspnea, unspecified: Secondary | ICD-10-CM | POA: Diagnosis not present

## 2016-01-08 DIAGNOSIS — I10 Essential (primary) hypertension: Secondary | ICD-10-CM | POA: Diagnosis not present

## 2016-01-08 DIAGNOSIS — F3130 Bipolar disorder, current episode depressed, mild or moderate severity, unspecified: Secondary | ICD-10-CM | POA: Diagnosis not present

## 2016-01-08 MED ORDER — QUETIAPINE FUMARATE 50 MG PO TABS: 100.0000 mg | ORAL_TABLET | Freq: Every day | ORAL | 0 refills | Status: DC

## 2016-01-08 NOTE — Progress Notes (Signed)
Subjective:   Patient ID: Marc Mayo, male    DOB: 09-19-1985, 30 y.o.   MRN: [id number] CC: New Patient (Initial Visit) (Suicidal Thoughts- 2 nights ago, No attempts)  HPI: Marc Mayo is a 30 y.o. male presenting for New Patient (Initial Visit) (Suicidal Thoughts- 2 nights ago, No attempts)  Here today with wife, four kids and two younger cousins Gala Murdoch separately from family Thoughts about hurting himself two nights ago Says he has voices that say stab himself with a knife He is able to ignore them himself Has been to [facility] in the past Has a wife and four kids at home, 10yo to [facility]  Says when he feels overwhelmed he goes for a walk and feels better Says he last had similar thoughts 2 years ago, would never go through with hurting himself Says he feels safe at home Denies any weapons at home  Treated for ADHD in the past with stimulants Valium has also worked to make him feel better When he was on high doses of seroquel (600mg ) in the past he felt like a zombie abilify made some symptoms worse  Past Medical History:  Diagnosis Date  . ADHD (attention deficit hyperactivity disorder)   . Bipolar affective disorder ([place])   . Chronic back pain   . Chronic eustachian tube dysfunction 11/2011  . Fatty liver   . GERD (gastroesophageal reflux disease)   . Headache(784.0)    migraines  . Hernia of abdominal wall   . History of gastric ulcer   . IBS (irritable bowel syndrome)   . Sleep apnea    was taken off machine as breathing has gotten better   Family History  Problem Relation Age of Onset  . Arthritis Mother   . COPD Mother   . Depression Mother   . Diabetes Mother   . Hyperlipidemia Mother   . Alcohol abuse Father   . Cancer Father   . COPD Father   . Stroke Father   . Early death Brother   . Asthma Daughter   . Colon cancer Neg Hx    Social History   Social History  . Marital status: Married    Spouse name: N/A  . Number of children: N/A  .  Years of education: N/A   Occupational History  . disability    Social History Main Topics  . Smoking status: Current Every Day Smoker    Packs/day: 2.00    Years: 11.00    Types: Cigarettes  . Smokeless tobacco: Never Used  . Alcohol use No  . Drug use: No  . Sexual activity: Yes   Other Topics Concern  . None   Social History Narrative  . None   ROS: All systems negative other than what is in HPI  Objective:    BP 114/77   Pulse 82   Temp 98.8 F (37.1 C) (Oral)   Ht 6\' 3"  (1.905 m)   Wt 294 lb (133.4 kg)   BMI 36.75 kg/m   Wt Readings from Last 3 Encounters:  01/08/16 294 lb (133.4 kg)  11/01/13 276 lb (125.2 kg)  05/15/13 273 lb (123.8 kg)    Gen: NAD, alert, cooperative with exam, NCAT EYES: EOMI, no conjunctival injection, or no icterus CV: NRRR, normal S1/S2, no murmur Resp: CTABL, no wheezes, normal WOB Abd: +BS, soft, NTND. no guarding or organomegaly Ext: No edema, warm Neuro: Alert and oriented MSK: normal muscle bulk Psych: normal affect, no thoughts of self harm  now, mood down with holiday stress ongoing  Assessment & Plan:  Cayne was seen today for new patient (initial visit).  Diagnoses and all orders for this visit:  Bipolar affective disorder, current episode depressed, current episode severity unspecified ([place]) Feels safe at home, comfortable talking with wife, calling clinic or emergency numbers if thoughts return or worsen Will start below, 50mg  first night then 100mg  nightly RTC 2 weeks Referral placed to [facility] -     QUEtiapine (SEROQUEL) 50 MG tablet; Take 2 tablets (100 mg total) by mouth at bedtime.  Essential hypertension Well controlled today, cont carvedilol  Dyspnea, unspecified type Seen by pulm 2 years ago, continues smoking now Uses symbicort, thinks it does help Possible anxiety component as well  Follow up plan: 2 weeks Assunta Found, MD [place]

## 2016-01-08 NOTE — Patient Instructions (Signed)
Take one tab seroquel tonight Tomorrow start two tabs nightly

## 2016-01-15 ENCOUNTER — Ambulatory Visit (INDEPENDENT_AMBULATORY_CARE_PROVIDER_SITE_OTHER): Payer: Medicare Other | Admitting: Pediatrics

## 2016-01-15 VITALS — BP 123/77 | HR 71 | Temp 97.0°F | Ht 75.0 in | Wt 296.6 lb

## 2016-01-15 DIAGNOSIS — J029 Acute pharyngitis, unspecified: Secondary | ICD-10-CM

## 2016-01-15 DIAGNOSIS — H9202 Otalgia, left ear: Secondary | ICD-10-CM | POA: Diagnosis not present

## 2016-01-15 DIAGNOSIS — J069 Acute upper respiratory infection, unspecified: Secondary | ICD-10-CM

## 2016-01-15 LAB — CULTURE, GROUP A STREP

## 2016-01-15 LAB — RAPID STREP SCREEN (MED CTR MEBANE ONLY): Strep Gp A Ag, IA W/Reflex: NEGATIVE

## 2016-01-15 MED ORDER — AMOXICILLIN 500 MG PO CAPS: 500.0000 mg | ORAL_CAPSULE | Freq: Two times a day (BID) | ORAL | 0 refills | Status: DC

## 2016-01-15 NOTE — Progress Notes (Signed)
  Subjective:   Patient ID: Marc Mayo, male    DOB: 10/18/1985, 30 y.o.   MRN: [id number] CC: Ear Pain (Left 3-4 days) and Sore Throat (2 days)  HPI: Marc Mayo is a 30 y.o. male presenting for Ear Pain (Left 3-4 days) and Sore Throat (2 days)  Wants to stop seroquel, makes him feel numb in the morning Has taken 100mg  for several days Denies any thoughts of self harm Not yet been contacted about behavioral health appt  Started having ear pain and sore throat a few days ago No fevers Feels like sharp stabbing pain in L ear Hurts more at night Several of his kids at home sick as well Does not think he is having any fevers Thinks he still has ear tubes both sides  Relevant past medical, surgical, family and social history reviewed. Allergies and medications reviewed and updated. History  Smoking Status  . Current Every Day Smoker  . Packs/day: 2.00  . Years: 11.00  . Types: Cigarettes  Smokeless Tobacco  . Never Used   ROS: Per HPI   Objective:    BP 123/77   Pulse 71   Temp 97 F (36.1 C) (Oral)   Ht 6\' 3"  (1.905 m)   Wt 296 lb 9.6 oz (134.5 kg)   BMI 37.07 kg/m   Wt Readings from Last 3 Encounters:  01/15/16 296 lb 9.6 oz (134.5 kg)  01/08/16 294 lb (133.4 kg)  11/01/13 276 lb (125.2 kg)    Gen: NAD, alert, cooperative with exam, NCAT EYES: EOMI, no conjunctival injection, or no icterus ENT:  L TM obscurred by cerumen, not able to clear with curette, R TM slightly red with PE tube laying in external canal, OP with erythema b/l no tonsillar exudates LYMPH: no cervical LAD CV: NRRR, normal S1/S2, no murmur, distal pulses 2+ b/l Resp: CTABL, no wheezes, normal WOB Ext: No edema, warm Neuro: Alert and oriented Psych: no thoughts of self harm  Assessment & Plan:  Marc Mayo was seen today for ear pain and sore throat.  Diagnoses and all orders for this visit:  Sore throat Rapid negative, will send for culture, h/o frewuent strep throat infections Per pt  he has been evaluated for possible tonsillectomy in the past -     Rapid strep screen (not at [facility]) -     Culture, Group A Strep  Acute URI Discussed symptomatic care  Left ear pain Not able to visualize L TM after attempted cleaning Has PE tubes, persistent pain L ear -     amoxicillin (AMOXIL) 500 MG capsule; Take 1 capsule (500 mg total) by mouth 2 (two) times daily. -     Ambulatory referral to ENT  Follow up plan: Next week as scheduled for mood Assunta Found, MD [place]

## 2016-01-17 LAB — CULTURE, GROUP A STREP

## 2016-01-18 HISTORY — PX: TONSILLECTOMY: SUR1361

## 2016-01-22 ENCOUNTER — Ambulatory Visit: Payer: Medicare Other | Admitting: Pediatrics

## 2016-01-22 DIAGNOSIS — F17210 Nicotine dependence, cigarettes, uncomplicated: Secondary | ICD-10-CM | POA: Diagnosis not present

## 2016-01-22 DIAGNOSIS — Z79899 Other long term (current) drug therapy: Secondary | ICD-10-CM | POA: Diagnosis not present

## 2016-01-22 DIAGNOSIS — Z885 Allergy status to narcotic agent status: Secondary | ICD-10-CM | POA: Diagnosis not present

## 2016-01-22 DIAGNOSIS — S20212A Contusion of left front wall of thorax, initial encounter: Secondary | ICD-10-CM | POA: Diagnosis not present

## 2016-01-22 DIAGNOSIS — S39012A Strain of muscle, fascia and tendon of lower back, initial encounter: Secondary | ICD-10-CM | POA: Diagnosis not present

## 2016-01-22 DIAGNOSIS — Z7951 Long term (current) use of inhaled steroids: Secondary | ICD-10-CM | POA: Diagnosis not present

## 2016-01-22 DIAGNOSIS — Z888 Allergy status to other drugs, medicaments and biological substances status: Secondary | ICD-10-CM | POA: Diagnosis not present

## 2016-01-22 DIAGNOSIS — S20211A Contusion of right front wall of thorax, initial encounter: Secondary | ICD-10-CM | POA: Diagnosis not present

## 2016-01-22 DIAGNOSIS — K219 Gastro-esophageal reflux disease without esophagitis: Secondary | ICD-10-CM | POA: Diagnosis not present

## 2016-01-22 DIAGNOSIS — S3992XA Unspecified injury of lower back, initial encounter: Secondary | ICD-10-CM | POA: Diagnosis not present

## 2016-01-22 DIAGNOSIS — Z886 Allergy status to analgesic agent status: Secondary | ICD-10-CM | POA: Diagnosis not present

## 2016-01-22 DIAGNOSIS — M545 Low back pain: Secondary | ICD-10-CM | POA: Diagnosis not present

## 2016-01-22 DIAGNOSIS — J42 Unspecified chronic bronchitis: Secondary | ICD-10-CM | POA: Diagnosis not present

## 2016-01-22 DIAGNOSIS — S299XXA Unspecified injury of thorax, initial encounter: Secondary | ICD-10-CM | POA: Diagnosis not present

## 2016-01-27 DIAGNOSIS — J3501 Chronic tonsillitis: Secondary | ICD-10-CM | POA: Diagnosis not present

## 2016-01-27 DIAGNOSIS — Z720 Tobacco use: Secondary | ICD-10-CM | POA: Diagnosis not present

## 2016-01-27 DIAGNOSIS — Z8669 Personal history of other diseases of the nervous system and sense organs: Secondary | ICD-10-CM | POA: Diagnosis not present

## 2016-01-27 DIAGNOSIS — H6983 Other specified disorders of Eustachian tube, bilateral: Secondary | ICD-10-CM | POA: Diagnosis not present

## 2016-01-28 ENCOUNTER — Encounter: Payer: Self-pay | Admitting: Pediatrics

## 2016-01-28 ENCOUNTER — Ambulatory Visit (INDEPENDENT_AMBULATORY_CARE_PROVIDER_SITE_OTHER): Payer: Medicare Other | Admitting: Pediatrics

## 2016-01-28 ENCOUNTER — Telehealth (HOSPITAL_COMMUNITY): Payer: Self-pay | Admitting: *Deleted

## 2016-01-28 VITALS — BP 138/89 | HR 96 | Temp 98.2°F | Ht 75.0 in | Wt 294.0 lb

## 2016-01-28 DIAGNOSIS — F3160 Bipolar disorder, current episode mixed, unspecified: Secondary | ICD-10-CM

## 2016-01-28 DIAGNOSIS — I10 Essential (primary) hypertension: Secondary | ICD-10-CM | POA: Diagnosis not present

## 2016-01-28 MED ORDER — LAMOTRIGINE 25 MG PO TABS: ORAL_TABLET | ORAL | 1 refills | Status: DC

## 2016-01-28 MED ORDER — CYCLOBENZAPRINE HCL 10 MG PO TABS: 10.0000 mg | ORAL_TABLET | Freq: Three times a day (TID) | ORAL | 0 refills | Status: DC | PRN

## 2016-01-28 NOTE — Telephone Encounter (Signed)
Office received ref from [facility] to sch new pt appt for pt. Called pt number on file and per pt he is about to go to the restroom so he will call office back after 1pm today to sch appt.
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
## 2016-02-01 DIAGNOSIS — Z885 Allergy status to narcotic agent status: Secondary | ICD-10-CM | POA: Diagnosis not present

## 2016-02-01 DIAGNOSIS — Z87442 Personal history of urinary calculi: Secondary | ICD-10-CM | POA: Diagnosis not present

## 2016-02-01 DIAGNOSIS — Z7951 Long term (current) use of inhaled steroids: Secondary | ICD-10-CM | POA: Diagnosis not present

## 2016-02-01 DIAGNOSIS — G4733 Obstructive sleep apnea (adult) (pediatric): Secondary | ICD-10-CM | POA: Diagnosis not present

## 2016-02-01 DIAGNOSIS — M545 Low back pain: Secondary | ICD-10-CM | POA: Diagnosis not present

## 2016-02-01 DIAGNOSIS — Z9989 Dependence on other enabling machines and devices: Secondary | ICD-10-CM | POA: Diagnosis not present

## 2016-02-01 DIAGNOSIS — Z888 Allergy status to other drugs, medicaments and biological substances status: Secondary | ICD-10-CM | POA: Diagnosis not present

## 2016-02-01 DIAGNOSIS — F17210 Nicotine dependence, cigarettes, uncomplicated: Secondary | ICD-10-CM | POA: Diagnosis not present

## 2016-02-01 DIAGNOSIS — Z720 Tobacco use: Secondary | ICD-10-CM | POA: Diagnosis not present

## 2016-02-01 DIAGNOSIS — K219 Gastro-esophageal reflux disease without esophagitis: Secondary | ICD-10-CM | POA: Diagnosis not present

## 2016-02-01 DIAGNOSIS — Z79899 Other long term (current) drug therapy: Secondary | ICD-10-CM | POA: Diagnosis not present

## 2016-02-04 ENCOUNTER — Other Ambulatory Visit: Payer: Self-pay | Admitting: Pediatrics

## 2016-02-04 DIAGNOSIS — F3130 Bipolar disorder, current episode depressed, mild or moderate severity, unspecified: Secondary | ICD-10-CM

## 2016-02-11 DIAGNOSIS — H6983 Other specified disorders of Eustachian tube, bilateral: Secondary | ICD-10-CM | POA: Diagnosis not present

## 2016-02-11 DIAGNOSIS — H6980 Other specified disorders of Eustachian tube, unspecified ear: Secondary | ICD-10-CM | POA: Diagnosis not present

## 2016-02-11 DIAGNOSIS — H6982 Other specified disorders of Eustachian tube, left ear: Secondary | ICD-10-CM | POA: Diagnosis not present

## 2016-02-11 DIAGNOSIS — H6981 Other specified disorders of Eustachian tube, right ear: Secondary | ICD-10-CM | POA: Diagnosis not present

## 2016-02-11 DIAGNOSIS — J3501 Chronic tonsillitis: Secondary | ICD-10-CM | POA: Diagnosis not present

## 2016-02-11 DIAGNOSIS — J351 Hypertrophy of tonsils: Secondary | ICD-10-CM | POA: Diagnosis not present
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
## 2016-02-18 ENCOUNTER — Encounter (INDEPENDENT_AMBULATORY_CARE_PROVIDER_SITE_OTHER): Payer: Self-pay

## 2016-02-18 ENCOUNTER — Encounter (HOSPITAL_COMMUNITY): Payer: Self-pay | Admitting: Psychiatry

## 2016-02-18 ENCOUNTER — Ambulatory Visit (INDEPENDENT_AMBULATORY_CARE_PROVIDER_SITE_OTHER): Payer: Medicare Other | Admitting: Psychiatry

## 2016-02-18 VITALS — BP 131/92 | HR 103 | Ht 75.0 in | Wt 288.0 lb

## 2016-02-18 DIAGNOSIS — Z8261 Family history of arthritis: Secondary | ICD-10-CM

## 2016-02-18 DIAGNOSIS — Z9889 Other specified postprocedural states: Secondary | ICD-10-CM

## 2016-02-18 DIAGNOSIS — F17210 Nicotine dependence, cigarettes, uncomplicated: Secondary | ICD-10-CM

## 2016-02-18 DIAGNOSIS — F330 Major depressive disorder, recurrent, mild: Secondary | ICD-10-CM | POA: Diagnosis not present

## 2016-02-18 DIAGNOSIS — Z811 Family history of alcohol abuse and dependence: Secondary | ICD-10-CM | POA: Diagnosis not present

## 2016-02-18 DIAGNOSIS — Z808 Family history of malignant neoplasm of other organs or systems: Secondary | ICD-10-CM

## 2016-02-18 DIAGNOSIS — F411 Generalized anxiety disorder: Secondary | ICD-10-CM | POA: Diagnosis not present

## 2016-02-18 DIAGNOSIS — Z833 Family history of diabetes mellitus: Secondary | ICD-10-CM

## 2016-02-18 DIAGNOSIS — Z79899 Other long term (current) drug therapy: Secondary | ICD-10-CM

## 2016-02-18 DIAGNOSIS — Z823 Family history of stroke: Secondary | ICD-10-CM

## 2016-02-18 DIAGNOSIS — Z888 Allergy status to other drugs, medicaments and biological substances status: Secondary | ICD-10-CM

## 2016-02-18 DIAGNOSIS — Z818 Family history of other mental and behavioral disorders: Secondary | ICD-10-CM

## 2016-02-18 MED ORDER — VENLAFAXINE HCL ER 37.5 MG PO CP24: 37.5000 mg | ORAL_CAPSULE | Freq: Every day | ORAL | 0 refills | Status: DC

## 2016-02-18 NOTE — Patient Instructions (Addendum)
1. Continue lamotrigine 50 mg daily 2. Start Effexor 37.5 mg daily 3. Bring the evaluation from psychologist regarding your ADHD 4. Return to clinic in one month

## 2016-02-25 ENCOUNTER — Ambulatory Visit: Payer: Medicare Other | Admitting: Pediatrics

## 2016-02-26 ENCOUNTER — Encounter: Payer: Self-pay | Admitting: Pediatrics

## 2016-03-02 ENCOUNTER — Ambulatory Visit: Payer: Medicare Other | Admitting: Pediatrics

## 2016-03-03 ENCOUNTER — Encounter: Payer: Self-pay | Admitting: Pediatrics

## 2016-03-03 ENCOUNTER — Telehealth: Payer: Self-pay | Admitting: Pediatrics

## 2016-03-14 ENCOUNTER — Other Ambulatory Visit: Payer: Self-pay | Admitting: *Deleted

## 2016-03-14 MED ORDER — OSELTAMIVIR PHOSPHATE 75 MG PO CAPS: 75.0000 mg | ORAL_CAPSULE | Freq: Every day | ORAL | 0 refills | Status: DC
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
## 2016-03-17 ENCOUNTER — Ambulatory Visit (HOSPITAL_COMMUNITY): Payer: Self-pay | Admitting: Psychiatry

## 2016-03-19 ENCOUNTER — Other Ambulatory Visit: Payer: Self-pay | Admitting: Pediatrics

## 2016-03-19 DIAGNOSIS — F3130 Bipolar disorder, current episode depressed, mild or moderate severity, unspecified: Secondary | ICD-10-CM

## 2016-03-21 NOTE — Telephone Encounter (Signed)
Last seen 01/28/16- vincent

## 2016-04-13 ENCOUNTER — Telehealth (HOSPITAL_COMMUNITY): Payer: Self-pay | Admitting: *Deleted

## 2016-04-13 NOTE — Telephone Encounter (Signed)
[facility] message from [facility] regarding patient's medication.  Effexor.

## 2016-04-14 ENCOUNTER — Telehealth (HOSPITAL_COMMUNITY): Payer: Self-pay | Admitting: *Deleted

## 2016-04-14 NOTE — Telephone Encounter (Signed)
Called patient to explain that an appointment would need to be made for a refill of Effexor. Was explaining that his pharmacy called [place] asking for the refill when the call was disconnected. Usure if patient ended the call but upon calling back patient could not be reached. Call was going straight to a voicemail.

## 2016-04-14 NOTE — Telephone Encounter (Signed)
Noted  

## 2016-04-14 NOTE — Telephone Encounter (Signed)
Called [facility] back about E[facility]xor. The pharmacist states they were calling for a refill. Upon chart review patient was seen as new patient on 02/18/16 and was a No Show for follow up appointment on 03/17/16. Unable to refill request without MD approval.

## 2016-04-19 NOTE — Telephone Encounter (Signed)
noted 

## 2016-04-22 ENCOUNTER — Encounter: Payer: Self-pay | Admitting: Pediatrics

## 2016-04-22 ENCOUNTER — Ambulatory Visit (INDEPENDENT_AMBULATORY_CARE_PROVIDER_SITE_OTHER): Payer: Medicare Other | Admitting: Pediatrics

## 2016-04-22 VITALS — BP 123/84 | HR 87 | Temp 98.2°F | Ht 75.0 in | Wt 302.8 lb

## 2016-04-22 DIAGNOSIS — Z6837 Body mass index (BMI) 37.0-37.9, adult: Secondary | ICD-10-CM | POA: Insufficient documentation

## 2016-04-22 DIAGNOSIS — R739 Hyperglycemia, unspecified: Secondary | ICD-10-CM

## 2016-04-22 DIAGNOSIS — I10 Essential (primary) hypertension: Secondary | ICD-10-CM | POA: Diagnosis not present

## 2016-04-22 DIAGNOSIS — E785 Hyperlipidemia, unspecified: Secondary | ICD-10-CM | POA: Diagnosis not present

## 2016-04-22 DIAGNOSIS — I8390 Asymptomatic varicose veins of unspecified lower extremity: Secondary | ICD-10-CM | POA: Insufficient documentation

## 2016-04-22 DIAGNOSIS — F330 Major depressive disorder, recurrent, mild: Secondary | ICD-10-CM

## 2016-04-22 LAB — LIPID PANEL
CHOLESTEROL TOTAL: 187 mg/dL (ref 100–199)
Chol/HDL Ratio: 6.2 ratio — ABNORMAL HIGH (ref 0.0–5.0)
HDL: 30 mg/dL — ABNORMAL LOW (ref >39)
LDL CALC: 122 mg/dL — AB (ref 0–99)
Triglycerides: 176 mg/dL — ABNORMAL HIGH (ref 0–149)
VLDL Cholesterol Cal: 35 mg/dL (ref 5–40)

## 2016-04-22 LAB — BMP8+EGFR
BUN/Creatinine Ratio: 10 (ref 9–20)
BUN: 6 mg/dL (ref 6–20)
CALCIUM: 9.3 mg/dL (ref 8.7–10.2)
CO2: 24 mmol/L (ref 18–29)
Chloride: 100 mmol/L (ref 96–106)
Creatinine, Ser: 0.63 mg/dL — ABNORMAL LOW (ref 0.76–1.27)
GFR, EST AFRICAN AMERICAN: 154 mL/min/{1.73_m2} (ref >59)
GFR, EST NON AFRICAN AMERICAN: 133 mL/min/{1.73_m2} (ref >59)
Glucose: 77 mg/dL (ref 65–99)
Potassium: 4.3 mmol/L (ref 3.5–5.2)
Sodium: 140 mmol/L (ref 134–144)

## 2016-04-22 LAB — BAYER DCA HB A1C WAIVED: HB A1C (BAYER DCA - WAIVED): 5.6 % (ref <7.0)

## 2016-04-22 NOTE — Progress Notes (Signed)
  Subjective:   Patient ID: Marc Mayo, male    DOB: 09-09-1985, 31 y.o.   MRN: [id number] CC: Painful knots on lower legs  HPI: Marc Mayo is a 31 y.o. male presenting for Painful knots on lower legs  Knots have been there for a couple of years Has been told they are varicose veins They are soft, not red, they ache some when he has been on his feet for a long time or at night  No CP, no SOB Taking BP meds regularly  Depression: planning on following up with Dr. Modesta Messing, symptoms stable Lots going on recently with not having reliable transportation he says but getting truck back today he thinks  Drinks more than 3 regular sodas every day  Depression screen [facility] 2/9 04/22/2016 01/28/2016 01/15/2016 01/08/2016  Decreased Interest 0 '1 1 3  '$ Down, Depressed, Hopeless 0 '1 1 2  '$ PHQ - 2 Score 0 '2 2 5  '$ Altered sleeping - 0 3 3  Tired, decreased energy - 0 1 1  Change in appetite - 0 0 0  Feeling bad or failure about yourself  - '1 3 3  '$ Trouble concentrating - 0 0 3  Moving slowly or fidgety/restless - 0 0 0  Suicidal thoughts - 0 0 1  PHQ-9 Score - '3 9 16  '$ Difficult doing work/chores - Somewhat difficult Somewhat difficult Very difficult     Relevant past medical, surgical, family and social history reviewed. Allergies and medications reviewed and updated. History  Smoking Status  . Current Every Day Smoker  . Packs/day: 1.50  . Years: 11.00  . Types: Cigarettes  Smokeless Tobacco  . Never Used   ROS: Per HPI   Objective:    BP 123/84   Pulse 87   Temp 98.2 F (36.8 C) (Oral)   Ht '6\' 3"'$  (1.905 m)   Wt (!) 302 lb 12.8 oz (137.3 kg)   BMI 37.85 kg/m   Wt Readings from Last 3 Encounters:  04/22/16 (!) 302 lb 12.8 oz (137.3 kg)  02/18/16 288 lb (130.6 kg)  01/28/16 294 lb (133.4 kg)    Gen: NAD, alert, cooperative with exam, NCAT EYES: EOMI, no conjunctival injection, or no icterus ENT:  OP without erythema CV: NRRR, normal S1/S2, no murmur, distal pulses 2+  b/l Resp: CTABL, no wheezes, normal WOB Ext: No edema, warm Neuro: Alert and oriented, strength equal b/l UE and LE, coordination grossly normal MSK: normal muscle bulk Skin: no rash. Lower b/l legs with a couple of soft, easily compressible apprx 1 cm nodules, no redness or irritation, non-tender today  Assessment & Plan:  [name] was seen today for painful knots on lower legs.  Diagnoses and all orders for this visit:  Varicose vein of leg No redness/inflammation Start compression hose -     Compression stockings  Mild episode of recurrent major depressive disorder (HCC) Stable, planning to follow up with Dr. Modesta Messing  Hyperglycemia -     [employer] [place] Hb A1c Waived  Essential hypertension Adequate control, cont current meds Due for labs -     BMP8+EGFR  Hyperlipidemia, unspecified hyperlipidemia type h/o fatty liver disease -     Lipid panel  BMI 37.0-37.9, adult Discussed lifestyle changes Decrease regular sodas to none   Follow up plan: Return in about 6 months (around 10/22/2016). Assunta Found, MD [place]

## 2016-04-22 NOTE — Patient Instructions (Signed)
Call for follow up with Dr. Modesta Messing  Use compression hose when you are on your feet for long periods of time

## 2016-05-18 ENCOUNTER — Telehealth (HOSPITAL_COMMUNITY): Payer: Self-pay | Admitting: *Deleted

## 2016-05-18 NOTE — Telephone Encounter (Signed)
No refill unless he has follow up appointment

## 2016-05-18 NOTE — Telephone Encounter (Signed)
Pt pharmacy [facility] for [facility] refill for pt Venlafaxine Cap 37.5 mg ER QAM. Per pt chart, medication was last filled on 02-18-2016 with 30 tabs 0 refills. Pt do not have f/u appt on file. Called pt to sch f/u appt. Pt picked up and confirmed it was him on the other line and when staff introduce themselves and which doctor office they were calling from, pt hung up. Please advise. Pt pharmacy number is [phone].

## 2016-05-31 NOTE — Telephone Encounter (Signed)
Called pt to sch an appt. Male picked up phone and staff provided their name and asked if it was pt name was provided to male that picked up. Then male that picked up hung up. Staff called back and pt want to a voicemail. Voicemail greeting stated pt name on it and lm for pt to call office to sch f/u appt

## 2016-05-31 NOTE — Telephone Encounter (Signed)
Office number provided on voicemail

## 2016-07-07 ENCOUNTER — Ambulatory Visit (INDEPENDENT_AMBULATORY_CARE_PROVIDER_SITE_OTHER): Payer: Medicare Other | Admitting: Pediatrics

## 2016-07-07 ENCOUNTER — Other Ambulatory Visit: Payer: Self-pay | Admitting: *Deleted

## 2016-07-07 ENCOUNTER — Ambulatory Visit (INDEPENDENT_AMBULATORY_CARE_PROVIDER_SITE_OTHER): Payer: Medicare Other

## 2016-07-07 ENCOUNTER — Encounter: Payer: Self-pay | Admitting: Pediatrics

## 2016-07-07 VITALS — BP 137/89 | HR 91 | Temp 97.3°F | Ht 75.0 in | Wt 302.2 lb

## 2016-07-07 DIAGNOSIS — M79644 Pain in right finger(s): Secondary | ICD-10-CM

## 2016-07-07 DIAGNOSIS — R079 Chest pain, unspecified: Secondary | ICD-10-CM

## 2016-07-07 DIAGNOSIS — S62662A Nondisplaced fracture of distal phalanx of right middle finger, initial encounter for closed fracture: Secondary | ICD-10-CM

## 2016-07-07 DIAGNOSIS — F419 Anxiety disorder, unspecified: Secondary | ICD-10-CM | POA: Diagnosis not present

## 2016-07-07 MED ORDER — BUSPIRONE HCL 5 MG PO TABS: 5.0000 mg | ORAL_TABLET | Freq: Three times a day (TID) | ORAL | 0 refills | Status: DC

## 2016-07-07 NOTE — Progress Notes (Signed)
  Subjective:   Patient ID: Marc Mayo, male    DOB: 22-Sep-1985, 31 y.o.   MRN: [id number] CC: Left sided pain (Constant, started last night)  HPI: Marc Mayo is a 31 y.o. 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 Denies thoughts of self harm Anxiety over bills is ongoing but getting better with wife working Has made it hard for him to get into doctors appts He was seen once by [facility] He says the lamotrigine made him feel "funny" like his skin was crawling effexor made him feel tired all the time so he stopped Says valium in the past has helped his anxiety  Takes his carvedilol every day  Broke his finger last year, recently tried to move window in car up with hand and jammed his finger, it has been hurting more ever since  Relevant past medical, surgical, family and social history reviewed. Allergies and medications reviewed and updated. History  Smoking Status  . Current Every Day Smoker  . Packs/day: 1.50  . Years: 11.00  . Types: Cigarettes  Smokeless Tobacco  . Never Used   ROS: Per HPI   Objective:    BP 137/89   Pulse 91   Temp 97.3 F (36.3 C) (Oral)   Ht 6\' 3"  (1.905 m)   Wt (!) 302 lb 3.2 oz (137.1 kg)   BMI 37.77 kg/m   Wt Readings from Last 3 Encounters:  07/07/16 (!) 302 lb 3.2 oz (137.1 kg)  04/22/16 (!) 302 lb 12.8 oz (137.3 kg)  02/18/16 288 lb (130.6 kg)    Gen: NAD, alert, cooperative with exam, NCAT EYES: EOMI, no conjunctival injection, or no icterus ENT:  OP without erythema LYMPH: no cervical LAD CV: NRRR, normal S1/S2, no murmur, distal  pulses 2+ b/l Resp: CTABL, no wheezes, normal WOB Abd: +BS, soft, NTND. no guarding or organomegaly Ext: No edema, warm Neuro: Alert and oriented, strength equal b/l UE and LE, coordination grossly normal MSK: ttp along L sternal border, L pectoris muscle Pain in L upper chest similar to pain from last night with L shoulder ROM Full ROM in L shoulder R middle finger slight ttp PIP joint  Assessment & Plan:  [name] was seen today for left sided pain.  Diagnoses and all orders for this visit: Chest pain Reproducible with palpation Likely MSK related Discussed return precautions, trial of NSAIDs for a couple of days, take with food then stop as can cause stomach problems Rest, ice, heat, ROM as needed  Anxiety Discussed will not prescribe valium for anxiety Can try below -     busPIRone (BUSPAR) 5 MG tablet; Take 1 tablet (5 mg total) by mouth 3 (three) times daily.  Finger pain, right -     DG Finger Middle Right; Future   Follow up plan: Return in about 4 weeks (around 08/04/2016). Assunta Found, MD [place]

## 2016-07-08 DIAGNOSIS — G43909 Migraine, unspecified, not intractable, without status migrainosus: Secondary | ICD-10-CM | POA: Diagnosis not present

## 2016-07-08 DIAGNOSIS — S6991XA Unspecified injury of right wrist, hand and finger(s), initial encounter: Secondary | ICD-10-CM | POA: Diagnosis not present

## 2016-07-08 DIAGNOSIS — G4733 Obstructive sleep apnea (adult) (pediatric): Secondary | ICD-10-CM | POA: Diagnosis not present

## 2016-07-08 DIAGNOSIS — S63611A Unspecified sprain of left index finger, initial encounter: Secondary | ICD-10-CM | POA: Diagnosis not present

## 2016-07-08 DIAGNOSIS — F17210 Nicotine dependence, cigarettes, uncomplicated: Secondary | ICD-10-CM | POA: Diagnosis not present

## 2016-07-08 DIAGNOSIS — S63612A Unspecified sprain of right middle finger, initial encounter: Secondary | ICD-10-CM | POA: Diagnosis not present

## 2016-07-08 DIAGNOSIS — Z888 Allergy status to other drugs, medicaments and biological substances status: Secondary | ICD-10-CM | POA: Diagnosis not present

## 2016-07-08 DIAGNOSIS — Z7951 Long term (current) use of inhaled steroids: Secondary | ICD-10-CM | POA: Diagnosis not present

## 2016-07-08 DIAGNOSIS — Z79899 Other long term (current) drug therapy: Secondary | ICD-10-CM | POA: Diagnosis not present

## 2016-07-08 DIAGNOSIS — J42 Unspecified chronic bronchitis: Secondary | ICD-10-CM | POA: Diagnosis not present

## 2016-07-08 DIAGNOSIS — K219 Gastro-esophageal reflux disease without esophagitis: Secondary | ICD-10-CM | POA: Diagnosis not present

## 2016-07-11 ENCOUNTER — Telehealth: Payer: Self-pay | Admitting: Pediatrics

## 2016-07-11 NOTE — Telephone Encounter (Signed)
Can't call that kind of medicine in. Shouldn't need more than the 8 tabs. Has he seen ortho? Is he wearing splint?

## 2016-07-11 NOTE — Telephone Encounter (Signed)
CD ready for pick up

## 2016-07-11 NOTE — Telephone Encounter (Signed)
Patient was given Hydrocodone 5-325mg  on 07/08/16 to take 1-2 tables every 6 hours as needed and quantity of 8 tables  in the ED.  Patient is now calling stating that he would like to have something sent to pharmacy for pain

## 2016-07-12 DIAGNOSIS — S63632A Sprain of interphalangeal joint of right middle finger, initial encounter: Secondary | ICD-10-CM | POA: Diagnosis not present

## 2016-07-12 DIAGNOSIS — M79644 Pain in right finger(s): Secondary | ICD-10-CM | POA: Diagnosis not present

## 2016-07-12 NOTE — Telephone Encounter (Signed)
Pt on his way to ortho now. Advised pt that ortho can give him rx for pain if needed.

## 2016-08-01 DIAGNOSIS — F330 Major depressive disorder, recurrent, mild: Secondary | ICD-10-CM | POA: Diagnosis not present

## 2016-08-01 DIAGNOSIS — F3177 Bipolar disorder, in partial remission, most recent episode mixed: Secondary | ICD-10-CM | POA: Diagnosis not present

## 2016-08-01 DIAGNOSIS — F411 Generalized anxiety disorder: Secondary | ICD-10-CM | POA: Diagnosis not present

## 2016-08-01 DIAGNOSIS — I10 Essential (primary) hypertension: Secondary | ICD-10-CM | POA: Diagnosis not present

## 2016-08-01 DIAGNOSIS — M5441 Lumbago with sciatica, right side: Secondary | ICD-10-CM | POA: Diagnosis not present

## 2016-08-01 DIAGNOSIS — G8929 Other chronic pain: Secondary | ICD-10-CM | POA: Diagnosis not present

## 2016-08-02 DIAGNOSIS — M5441 Lumbago with sciatica, right side: Secondary | ICD-10-CM | POA: Diagnosis not present

## 2016-08-02 DIAGNOSIS — G8929 Other chronic pain: Secondary | ICD-10-CM | POA: Diagnosis not present

## 2016-08-08 ENCOUNTER — Ambulatory Visit: Payer: Medicare Other | Admitting: Pediatrics

## 2016-08-10 ENCOUNTER — Encounter: Payer: Self-pay | Admitting: Pediatrics

## 2016-08-10 DIAGNOSIS — Z885 Allergy status to narcotic agent status: Secondary | ICD-10-CM | POA: Diagnosis not present

## 2016-08-10 DIAGNOSIS — Z888 Allergy status to other drugs, medicaments and biological substances status: Secondary | ICD-10-CM | POA: Diagnosis not present

## 2016-08-10 DIAGNOSIS — Z7951 Long term (current) use of inhaled steroids: Secondary | ICD-10-CM | POA: Diagnosis not present

## 2016-08-10 DIAGNOSIS — R000 Tachycardia, unspecified: Secondary | ICD-10-CM | POA: Diagnosis not present

## 2016-08-10 DIAGNOSIS — G4733 Obstructive sleep apnea (adult) (pediatric): Secondary | ICD-10-CM | POA: Diagnosis not present

## 2016-08-10 DIAGNOSIS — Z881 Allergy status to other antibiotic agents status: Secondary | ICD-10-CM | POA: Diagnosis not present

## 2016-08-10 DIAGNOSIS — Z79899 Other long term (current) drug therapy: Secondary | ICD-10-CM | POA: Diagnosis not present

## 2016-08-10 DIAGNOSIS — K219 Gastro-esophageal reflux disease without esophagitis: Secondary | ICD-10-CM | POA: Diagnosis not present

## 2016-08-10 DIAGNOSIS — M7989 Other specified soft tissue disorders: Secondary | ICD-10-CM | POA: Diagnosis not present

## 2016-08-10 DIAGNOSIS — R509 Fever, unspecified: Secondary | ICD-10-CM | POA: Diagnosis not present

## 2016-08-10 DIAGNOSIS — Z886 Allergy status to analgesic agent status: Secondary | ICD-10-CM | POA: Diagnosis not present

## 2016-08-10 DIAGNOSIS — S6991XA Unspecified injury of right wrist, hand and finger(s), initial encounter: Secondary | ICD-10-CM | POA: Diagnosis not present

## 2016-08-10 DIAGNOSIS — F17210 Nicotine dependence, cigarettes, uncomplicated: Secondary | ICD-10-CM | POA: Diagnosis not present

## 2016-08-10 DIAGNOSIS — W228XXA Striking against or struck by other objects, initial encounter: Secondary | ICD-10-CM | POA: Diagnosis not present

## 2016-08-10 DIAGNOSIS — S60221A Contusion of right hand, initial encounter: Secondary | ICD-10-CM | POA: Diagnosis not present

## 2016-08-13 ENCOUNTER — Encounter (HOSPITAL_COMMUNITY): Payer: Self-pay | Admitting: Emergency Medicine

## 2016-08-13 ENCOUNTER — Emergency Department (HOSPITAL_COMMUNITY)
Admission: EM | Admit: 2016-08-13 | Discharge: 2016-08-13 | Disposition: A | Discharge status: Home / Self Care | Payer: Medicare Other | Attending: Emergency Medicine | Admitting: Emergency Medicine

## 2016-08-13 ENCOUNTER — Emergency Department (HOSPITAL_COMMUNITY): Payer: Medicare Other

## 2016-08-13 DIAGNOSIS — Z79899 Other long term (current) drug therapy: Secondary | ICD-10-CM | POA: Insufficient documentation

## 2016-08-13 DIAGNOSIS — I10 Essential (primary) hypertension: Secondary | ICD-10-CM | POA: Insufficient documentation

## 2016-08-13 DIAGNOSIS — F17210 Nicotine dependence, cigarettes, uncomplicated: Secondary | ICD-10-CM | POA: Insufficient documentation

## 2016-08-13 DIAGNOSIS — M545 Low back pain, unspecified: Secondary | ICD-10-CM

## 2016-08-13 DIAGNOSIS — W19XXXA Unspecified fall, initial encounter: Secondary | ICD-10-CM

## 2016-08-13 LAB — URINALYSIS, ROUTINE W REFLEX MICROSCOPIC
Bilirubin Urine: NEGATIVE
GLUCOSE, UA: NEGATIVE mg/dL
Hgb urine dipstick: NEGATIVE
KETONES UR: NEGATIVE mg/dL
Leukocytes, UA: NEGATIVE
Nitrite: NEGATIVE
PROTEIN: NEGATIVE mg/dL
Specific Gravity, Urine: 1.005 (ref 1.005–1.030)
pH: 8 (ref 5.0–8.0)

## 2016-08-13 MED ORDER — OXYCODONE-ACETAMINOPHEN 5-325 MG PO TABS: 1.0000 | ORAL_TABLET | ORAL | 0 refills | Status: DC | PRN

## 2016-08-13 MED ORDER — METHOCARBAMOL 500 MG PO TABS: 500.0000 mg | ORAL_TABLET | Freq: Two times a day (BID) | ORAL | 0 refills | Status: DC

## 2016-08-13 MED ORDER — OXYCODONE-ACETAMINOPHEN 5-325 MG PO TABS
1.0000 | ORAL_TABLET | Freq: Once | ORAL | Status: AC
  Administered 2016-08-13: 1 via ORAL
  Filled 2016-08-13: qty 1

## 2016-08-13 NOTE — ED Triage Notes (Signed)
Pt reports slipping and falling on a child's toy in his home.  C/o lower back pain.  Appt with pcp on Monday.

## 2016-08-13 NOTE — Discharge Instructions (Signed)
Please read attached information regarding your condition. Take pain medication and muscle relaxer as needed. Follow up with PCP for further evaluation as needed. Return to ED for worsening pain, trouble walking, numbness, weakness, additional injury, loss of consciousness.

## 2016-08-13 NOTE — ED Provider Notes (Signed)
[facility] DE[facility] Provider Note   CSN: [id number] Arrival date & time: 08/13/16  1329     History   Chief Complaint Chief Complaint  Patient presents with  . Fall    back pain    HPI FERGUS THRONE is a 31 y.o. male.  HPI  Patient presents to ED for evaluation of low back pain that occurred after tripping and falling 2 days ago on a child's toy. He states that he tripped and fell on his right side of his back. Since then he is able to ambulate but having pain. He denies any urinary symptoms but is unsure if this could be related to a kidney stone which she has had in the past. He denies any head injury, loss of consciousness, numbness, weakness, urinary or bowel incontinence, previous back surgery, history of cancer or IV drug use. Has tried Tylenol and Aleve with no improvement in symptoms.  Past Medical History:  Diagnosis Date  . ADHD (attention deficit hyperactivity disorder)   . Bipolar affective disorder ([place])   . Chronic back pain   . Chronic eustachian tube dysfunction 11/2011  . Fatty liver   . GERD (gastroesophageal reflux disease)   . Headache(784.0)    migraines  . Hernia of abdominal wall   . History of gastric ulcer   . IBS (irritable bowel syndrome)   . Sleep apnea    was taken off machine as breathing has gotten better    Patient Active Problem List   Diagnosis Date Noted  . Varicose vein of leg 04/22/2016  . BMI 37.0-37.9, adult 04/22/2016  . Mild episode of recurrent major depressive disorder ([place]) 02/18/2016  . GAD (generalized anxiety disorder) 02/18/2016  . Essential hypertension 01/08/2016  . Cough 06/18/2012  . Dyspnea 05/01/2012  . Fatty liver 04/06/2012  . Smoker 04/11/2011  . OSA (obstructive sleep apnea) 02/03/2011  . IBS 02/22/2010  . BIPOLAR AFFECTIVE DISORDER 12/08/2009  . GERD 12/08/2009  . FATTY LIVER DISEASE 12/08/2009  . DIARRHEA, CHRONIC 12/08/2009  . ABDOMINAL PAIN 12/08/2009  . CHEST PAIN UNSPECIFIED 03/17/2008     Past Surgical History:  Procedure Laterality Date  . ESOPHAGOGASTRODUODENOSCOPY  2011   Dr. Oneida Alar: chronic non-H.pylori gastritis, normal small bowel biopsy   . MYRINGOTOMY WITH TUBE PLACEMENT  12/12/2011   Procedure: MYRINGOTOMY WITH TUBE PLACEMENT;  Surgeon: Jodi Marble, MD;  Location: [facility];  Service: ENT;  Laterality: Bilateral;  Bilateral T Tubes  . NASAL ENDOSCOPY WITH EPISTAXIS CONTROL  12/12/2011   Procedure: NASAL ENDOSCOPY WITH EPISTAXIS CONTROL;  Surgeon: Jodi Marble, MD;  Location: [facility];  Service: ENT;  Laterality: Bilateral;  Direct Nasal Endoscopy with Cautery of Eustachian Tube Tori, Bilateral  . TYMPANOSTOMY TUBE PLACEMENT     x 7       Home Medications    Prior to Admission medications   Medication Sig Start Date End Date Taking? Authorizing Provider  budesonide-formoterol (SYMBICORT) 160-4.5 MCG/ACT inhaler Inhale 2 puffs into the lungs 2 (two) times daily.    [provider]  busPIRone (BUSPAR) 5 MG tablet Take 1 tablet (5 mg total) by mouth 3 (three) times daily. 07/07/16   Eustaquio Maize, MD  carvedilol (COREG) 6.25 MG tablet Take 6.25 mg by mouth 2 (two) times daily with a meal.    [provider]  ibuprofen (ADVIL,MOTRIN) 200 MG tablet Take 400-800 mg by mouth every 6 (six) hours as needed.    [provider]  methocarbamol (ROBAXIN)  500 MG tablet Take 1 tablet (500 mg total) by mouth 2 (two) times daily. 08/13/16   Jaamal Farooqui, PA-C  omeprazole (PRILOSEC) 20 MG capsule Take 20 mg by mouth daily.    [provider]  oxyCODONE-acetaminophen (PERCOCET/ROXICET) 5-325 MG tablet Take 1 tablet by mouth every 4 (four) hours as needed for severe pain. 08/13/16   Delia Heady, PA-C    Family History Family History  Problem Relation Age of Onset  . Arthritis Mother   . COPD Mother   . Depression Mother   . Diabetes Mother   . Hyperlipidemia Mother   . Alcohol abuse Father   .  Cancer Father   . COPD Father   . Stroke Father   . Early death Brother   . Asthma Daughter   . Colon cancer Neg Hx     Social History Social History  Substance Use Topics  . Smoking status: Current Every Day Smoker    Packs/day: 1.50    Years: 11.00    Types: Cigarettes  . Smokeless tobacco: Never Used  . Alcohol use No     Allergies   Tramadol; Alprazolam; Aripiprazole; Celexa [citalopram hydrobromide]; Citalopram; Ketorolac; Lorazepam; Paxil  [paroxetine hcl]; Sertraline; Sertraline hcl; Zoloft  [sertraline hcl]; Fluoxetine; Ketorolac tromethamine; Paroxetine hcl; and Prozac [fluoxetine hcl]   Review of Systems Review of Systems  Constitutional: Negative for chills and fever.  Respiratory: Negative for shortness of breath.   Gastrointestinal: Negative for nausea and vomiting.  Genitourinary: Positive for flank pain. Negative for decreased urine volume, dysuria, hematuria and urgency.  Musculoskeletal: Positive for back pain. Negative for neck pain and neck stiffness.  Skin: Negative for wound.  Neurological: Negative for weakness and numbness.     Physical Exam Updated Vital Signs BP (!) 156/106 (BP Location: Right Arm)   Pulse 90   Temp 97.6 F (36.4 C) (Oral)   Resp 18   Ht 6\' 3"  (1.905 m)   Wt 132.5 kg (292 lb)   SpO2 100%   BMI 36.50 kg/m   Physical Exam  Constitutional: He appears well-developed and well-nourished. No distress.  HENT:  Head: Normocephalic and atraumatic.  Eyes: Conjunctivae and EOM are normal. No scleral icterus.  Neck: Normal range of motion.  Pulmonary/Chest: Effort normal. No respiratory distress.  Musculoskeletal:  No CVA tenderness. TTP of the lumbar spine at midline as well as paraspinal musculature. No midline spinal tenderness present in thoracic or cervical spine. No step-off palpated. No visible bruising, edema or temperature change noted. No objective signs of numbness present. No saddle anesthesia. 2+ DP pulses bilaterally.  Sensation intact to light touch. Strength 5/5 in bilateral lower extremities.   Neurological: He is alert.  Skin: No rash noted. He is not diaphoretic.  Psychiatric: He has a normal mood and affect.  Nursing note and vitals reviewed.    ED Treatments / Results  Labs (all labs ordered are listed, but only abnormal results are displayed) Labs Reviewed  URINALYSIS, ROUTINE W REFLEX MICROSCOPIC    EKG  EKG Interpretation None       Radiology Dg Lumbar Spine Complete  Result Date: 08/13/2016 CLINICAL DATA:  Low back pain.  Status post fall. EXAM: LUMBAR SPINE - COMPLETE 4+ VIEW COMPARISON:  11/24/2015 FINDINGS: There is no evidence of lumbar spine fracture. Alignment is normal. Intervertebral disc spaces are maintained. IMPRESSION: Negative. Electronically Signed   By: Kerby Moors M.D.   On: 08/13/2016 15:51    Procedures Procedures (including critical care time)  Medications Ordered in ED Medications  oxyCODONE-acetaminophen (PERCOCET/ROXICET) 5-325 MG per tablet 1 tablet (1 tablet Oral Given 08/13/16 1556)     Initial Impression / Assessment and Plan / ED Course  I have reviewed the triage vital signs and the nursing notes.  Pertinent labs & imaging results that were available during my care of the patient were reviewed by me and considered in my medical decision making (see chart for details).    Patient presents to ED for evaluation of low back pain that occurred after mechanical fall 2 days ago. He is ambulatory but pain with weightbearing. Denies any previous back surgery, urinary incontinence, history of cancer, history of IV drug use, numbness or weakness. On physical exam there is tenderness to palpation of the lumbar spine. No visible deformity noted or step-off palpated. No CVA tenderness noted. He is afebrile with no history of fever. X-rays of lumbar spine returned as negative. Urinalysis showed no evidence of hematuria or UTI. Pain controlled here in the ED. Low  suspicion for cauda equina or other acute spinal cord injury being the cause of his symptoms. Will discharge with short course of pain medication and muscle relaxer to be taken as needed. Advised to apply heat and stretch area as tolerated. Patient states that he has a follow-up appointment with his PCP in 2 days. I encouraged him to keep this appointment for further management of symptoms. Patient appears stable for discharge at this time. Strict return precautions given. Narcotic database reviewed prior to prescribing.  Final Clinical Impressions(s) / ED Diagnoses   Final diagnoses:  Fall, initial encounter  Acute right-sided low back pain without sciatica    New Prescriptions Discharge Medication List as of 08/13/2016  4:18 PM    START taking these medications   Details  methocarbamol (ROBAXIN) 500 MG tablet Take 1 tablet (500 mg total) by mouth 2 (two) times daily., Starting Sat 08/13/2016, Print    oxyCODONE-acetaminophen (PERCOCET/ROXICET) 5-325 MG tablet Take 1 tablet by mouth every 4 (four) hours as needed for severe pain., Starting Sat 08/13/2016, Print         Shelly Coss Healy, PA-C 08/13/16 1643    Milton Ferguson, MD 08/13/16 2243
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
## 2016-08-16 DIAGNOSIS — G8929 Other chronic pain: Secondary | ICD-10-CM | POA: Diagnosis not present

## 2016-08-16 DIAGNOSIS — M5441 Lumbago with sciatica, right side: Secondary | ICD-10-CM | POA: Diagnosis not present

## 2016-08-18 ENCOUNTER — Emergency Department (HOSPITAL_COMMUNITY): Payer: Medicare Other

## 2016-08-18 ENCOUNTER — Encounter (HOSPITAL_COMMUNITY): Payer: Self-pay

## 2016-08-18 ENCOUNTER — Emergency Department (HOSPITAL_COMMUNITY)
Admission: EM | Admit: 2016-08-18 | Discharge: 2016-08-18 | Disposition: A | Discharge status: Home / Self Care | Payer: Medicare Other | Attending: Emergency Medicine | Admitting: Emergency Medicine

## 2016-08-18 ENCOUNTER — Ambulatory Visit (HOSPITAL_COMMUNITY): Payer: Medicare Other | Admitting: Psychiatry

## 2016-08-18 DIAGNOSIS — Z79899 Other long term (current) drug therapy: Secondary | ICD-10-CM | POA: Diagnosis not present

## 2016-08-18 DIAGNOSIS — R0789 Other chest pain: Secondary | ICD-10-CM | POA: Diagnosis not present

## 2016-08-18 DIAGNOSIS — R109 Unspecified abdominal pain: Secondary | ICD-10-CM | POA: Insufficient documentation

## 2016-08-18 DIAGNOSIS — F17210 Nicotine dependence, cigarettes, uncomplicated: Secondary | ICD-10-CM | POA: Diagnosis not present

## 2016-08-18 DIAGNOSIS — R079 Chest pain, unspecified: Secondary | ICD-10-CM | POA: Insufficient documentation

## 2016-08-18 DIAGNOSIS — R0602 Shortness of breath: Secondary | ICD-10-CM | POA: Insufficient documentation

## 2016-08-18 DIAGNOSIS — I10 Essential (primary) hypertension: Secondary | ICD-10-CM | POA: Insufficient documentation

## 2016-08-18 DIAGNOSIS — R061 Stridor: Secondary | ICD-10-CM | POA: Diagnosis not present

## 2016-08-18 HISTORY — DX: Essential (primary) hypertension: I10

## 2016-08-18 LAB — BASIC METABOLIC PANEL
Anion gap: 7 (ref 5–15)
BUN: 5 mg/dL — ABNORMAL LOW (ref 6–20)
CHLORIDE: 108 mmol/L (ref 101–111)
CO2: 28 mmol/L (ref 22–32)
CREATININE: 0.76 mg/dL (ref 0.61–1.24)
Calcium: 9.2 mg/dL (ref 8.9–10.3)
GFR calc non Af Amer: >60 mL/min (ref >60)
Glucose, Bld: 117 mg/dL — ABNORMAL HIGH (ref 65–99)
Potassium: 4.1 mmol/L (ref 3.5–5.1)
Sodium: 143 mmol/L (ref 135–145)

## 2016-08-18 LAB — CBC
HCT: 45.7 % (ref 39.0–52.0)
Hemoglobin: 15.9 g/dL (ref 13.0–17.0)
MCH: 31.3 pg (ref 26.0–34.0)
MCHC: 34.8 g/dL (ref 30.0–36.0)
MCV: 90 fL (ref 78.0–100.0)
PLATELETS: 239 10*3/uL (ref 150–400)
RBC: 5.08 MIL/uL (ref 4.22–5.81)
RDW: 13.2 % (ref 11.5–15.5)
WBC: 10.3 10*3/uL (ref 4.0–10.5)

## 2016-08-18 LAB — RAPID URINE DRUG SCREEN, HOSP PERFORMED
AMPHETAMINES: NOT DETECTED
BARBITURATES: NOT DETECTED
BENZODIAZEPINES: POSITIVE — AB
Cocaine: NOT DETECTED
Opiates: NOT DETECTED
Tetrahydrocannabinol: POSITIVE — AB

## 2016-08-18 LAB — TROPONIN I: Troponin I: <0.03 ng/mL (ref <0.03)

## 2016-08-18 LAB — PROTIME-INR
INR: 0.98
PROTHROMBIN TIME: 13 s (ref 11.4–15.2)

## 2016-08-18 MED ORDER — GI COCKTAIL ~~LOC~~
30.0000 mL | Freq: Once | ORAL | Status: AC
  Administered 2016-08-18: 30 mL via ORAL
  Filled 2016-08-18: qty 30

## 2016-08-18 NOTE — ED Provider Notes (Signed)
[facility] DEPT Provider Note   CSN: [id number] Arrival date & time: 08/18/16  1834     History   Chief Complaint Chief Complaint  Patient presents with  . Chest Pain    HPI Marc Mayo is a 31 y.o. male.  HPI Patient presents with chest pain. Senna for last 2 days. States he smokes some marijuana with a friend and found out later that it may have been laced with fentanyl. States another friend had the same stuff and had it tested. States his friend was not effective because he smokes every day. States it hurts across his chest. No cough. States that he saw his dead family members with Jesus. Slight headache since then also. No history of coronary artery disease. States he does have a family history however. Past Medical History:  Diagnosis Date  . ADHD (attention deficit hyperactivity disorder)   . Bipolar affective disorder ([place])   . Chronic back pain   . Chronic eustachian tube dysfunction 11/2011  . Fatty liver   . GERD (gastroesophageal reflux disease)   . Headache(784.0)    migraines  . Hernia of abdominal wall   . History of gastric ulcer   . Hypertension   . IBS (irritable bowel syndrome)   . Sleep apnea    was taken off machine as breathing has gotten better    Patient Active Problem List   Diagnosis Date Noted  . Varicose vein of leg 04/22/2016  . BMI 37.0-37.9, adult 04/22/2016  . Mild episode of recurrent major depressive disorder ([place]) 02/18/2016  . GAD (generalized anxiety disorder) 02/18/2016  . Essential hypertension 01/08/2016  . Cough 06/18/2012  . Dyspnea 05/01/2012  . Fatty liver 04/06/2012  . Smoker 04/11/2011  . OSA (obstructive sleep apnea) 02/03/2011  . IBS 02/22/2010  . BIPOLAR AFFECTIVE DISORDER 12/08/2009  . GERD 12/08/2009  . FATTY LIVER DISEASE 12/08/2009  . DIARRHEA, CHRONIC 12/08/2009  . ABDOMINAL PAIN 12/08/2009  . CHEST PAIN UNSPECIFIED 03/17/2008    Past Surgical History:  Procedure Laterality Date  .  ESOPHAGOGASTRODUODENOSCOPY  2011   Dr. Oneida Alar: chronic non-H.pylori gastritis, normal small bowel biopsy   . MYRINGOTOMY WITH TUBE PLACEMENT  12/12/2011   Procedure: MYRINGOTOMY WITH TUBE PLACEMENT;  Surgeon: Jodi Marble, MD;  Location: [place];  Service: ENT;  Laterality: Bilateral;  Bilateral T Tubes  . NASAL ENDOSCOPY WITH EPISTAXIS CONTROL  12/12/2011   Procedure: NASAL ENDOSCOPY WITH EPISTAXIS CONTROL;  Surgeon: Jodi Marble, MD;  Location: [place];  Service: ENT;  Laterality: Bilateral;  Direct Nasal Endoscopy with Cautery of Eustachian Tube Tori, Bilateral  . TYMPANOSTOMY TUBE PLACEMENT     x 7       Home Medications    Prior to Admission medications   Medication Sig Start Date End Date Taking? Authorizing Provider  budesonide-formoterol (SYMBICORT) 160-4.5 MCG/ACT inhaler Inhale 2 puffs into the lungs 2 (two) times daily.   Yes [provider]  carvedilol (COREG) 6.25 MG tablet Take 6.25 mg by mouth 2 (two) times daily with a meal.   Yes [provider]  diazepam (VALIUM) 10 MG tablet Take 1 tablet by mouth every 12 (twelve) hours as needed for anxiety, sleep or muscle spasms. 08/16/16  Yes [provider]  diclofenac (VOLTAREN) 75 MG EC tablet Take 1 tablet by mouth 2 (two) times daily. 08/16/16  Yes [provider]  ibuprofen (ADVIL,MOTRIN) 200 MG tablet Take 400-800 mg by mouth every 6 (six) hours as needed.  Yes [provider]  omeprazole (PRILOSEC) 20 MG capsule Take 20 mg by mouth daily.   Yes [provider]  venlafaxine XR (EFFEXOR-XR) 75 MG 24 hr capsule Take 1 capsule by mouth daily. 08/01/16 08/01/17 Yes [provider]    Family History Family History  Problem Relation Age of Onset  . Arthritis Mother   . COPD Mother   . Depression Mother   . Diabetes Mother   . Hyperlipidemia Mother   . Alcohol abuse Father   . Cancer Father   . COPD Father   . Stroke Father     . Early death Brother   . Asthma Daughter   . Colon cancer Neg Hx     Social History Social History  Substance Use Topics  . Smoking status: Current Every Day Smoker    Packs/day: 1.50    Years: 11.00    Types: Cigarettes  . Smokeless tobacco: Never Used  . Alcohol use No     Allergies   Tramadol; Alprazolam; Aripiprazole; Celexa [citalopram hydrobromide]; Citalopram; Ketorolac; Lorazepam; Paxil  [paroxetine hcl]; Sertraline; Sertraline hcl; Zoloft  [sertraline hcl]; Fluoxetine; Ketorolac tromethamine; Paroxetine hcl; and Prozac [fluoxetine hcl]   Review of Systems Review of Systems  Constitutional: Negative for appetite change and fever.  HENT: Negative for congestion.   Eyes: Negative for photophobia.  Respiratory: Positive for chest tightness and shortness of breath.   Cardiovascular: Positive for chest pain.  Gastrointestinal: Positive for abdominal pain.  Endocrine: Negative for polyuria.  Genitourinary: Negative for dysuria.  Musculoskeletal: Negative for back pain.  Skin: Negative for rash.  Neurological: Negative for syncope.  Hematological: Negative for adenopathy.  Psychiatric/Behavioral: Positive for hallucinations.     Physical Exam Updated Vital Signs BP 128/86   Pulse 82   Temp 98.4 F (36.9 C) (Oral)   Resp (!) 27   Ht 6\' 3"  (1.905 m)   Wt 132.5 kg (292 lb)   SpO2 97%   BMI 36.50 kg/m   Physical Exam  Constitutional: He appears well-developed.  HENT:  Head: Atraumatic.  Eyes: Pupils are equal, round, and reactive to light.  Neck: Neck supple.  Cardiovascular: Normal rate.   Pulmonary/Chest: Effort normal.  Abdominal: Soft. There is no tenderness.  Musculoskeletal: Normal range of motion.  Neurological: He is alert.  Skin: Skin is warm. Capillary refill takes less than 2 seconds.     ED Treatments / Results  Labs (all labs ordered are listed, but only abnormal results are displayed) Labs Reviewed  BASIC METABOLIC PANEL - Abnormal;  Notable for the following:       Result Value   Glucose, Bld 117 (*)    BUN 5 (*)    All other components within normal limits  RAPID URINE DRUG SCREEN, HOSP PERFORMED - Abnormal; Notable for the following:    Benzodiazepines POSITIVE (*)    Tetrahydrocannabinol POSITIVE (*)    All other components within normal limits  CBC  TROPONIN I  PROTIME-INR    EKG  EKG Interpretation  Date/Time:  Thursday August 18 2016 18:42:33 EDT Ventricular Rate:  94 PR Interval:    QRS Duration: 92 QT Interval:  341 QTC Calculation: 427 R Axis:   58 Text Interpretation:  Sinus rhythm Confirmed by Davonna Belling [phone]) on 08/18/2016 6:53:28 PM       Radiology Dg Chest 2 View  Result Date: 08/18/2016 CLINICAL DATA:  31 year old male with left-sided chest pain EXAM: CHEST  2 VIEW COMPARISON:  Prior chest x-ray 02/07/2013  FINDINGS: The lungs are clear and negative for focal airspace consolidation, pulmonary edema or suspicious pulmonary nodule. No pleural effusion or pneumothorax. Cardiac and mediastinal contours are within normal limits. No acute fracture or lytic or blastic osseous lesions. The visualized upper abdominal bowel gas pattern is unremarkable. IMPRESSION: Negative chest x-ray. Electronically Signed   By: Jacqulynn Cadet M.D.   On: 08/18/2016 19:31    Procedures Procedures (including critical care time)  Medications Ordered in ED Medications  gi cocktail (Maalox,Lidocaine,Donnatal) (30 mLs Oral Given 08/18/16 1853)     Initial Impression / Assessment and Plan / ED Course  I have reviewed the triage vital signs and the nursing notes.  Pertinent labs & imaging results that were available during my care of the patient were reviewed by me and considered in my medical decision making (see chart for details).     Patient presents with nonspecific chest pain and headache after smoking marijuana. EKG reassuring. Cardiac workup reassuring. X-ray reassuring. Doubt cardiac cause. Could  be an anxiety component. No relief for GI cocktail. Discharge home.  Final Clinical Impressions(s) / ED Diagnoses   Final diagnoses:  Nonspecific chest pain    New Prescriptions New Prescriptions   No medications on file     Davonna Belling, MD 08/18/16 2051

## 2016-08-18 NOTE — ED Triage Notes (Signed)
Patient arrived via EMS for complaint of chest pain, headache x2 days after smoking marijuana that was possibly laced with Fentanyl. EMs gave patient 1 nitro with relief and 324 baby ASA.

## 2016-08-18 NOTE — ED Notes (Signed)
ED Provider at bedside. 

## 2016-08-24 ENCOUNTER — Encounter (HOSPITAL_COMMUNITY): Payer: Self-pay | Admitting: Emergency Medicine

## 2016-08-24 ENCOUNTER — Emergency Department (HOSPITAL_COMMUNITY)
Admission: EM | Admit: 2016-08-24 | Discharge: 2016-08-25 | Disposition: A | Discharge status: Home / Self Care | Payer: Medicare Other | Attending: Emergency Medicine | Admitting: Emergency Medicine

## 2016-08-24 DIAGNOSIS — F322 Major depressive disorder, single episode, severe without psychotic features: Secondary | ICD-10-CM | POA: Insufficient documentation

## 2016-08-24 DIAGNOSIS — I10 Essential (primary) hypertension: Secondary | ICD-10-CM | POA: Insufficient documentation

## 2016-08-24 DIAGNOSIS — R45851 Suicidal ideations: Secondary | ICD-10-CM | POA: Insufficient documentation

## 2016-08-24 DIAGNOSIS — Z79899 Other long term (current) drug therapy: Secondary | ICD-10-CM | POA: Insufficient documentation

## 2016-08-24 DIAGNOSIS — Z9114 Patient's other noncompliance with medication regimen: Secondary | ICD-10-CM | POA: Diagnosis not present

## 2016-08-24 DIAGNOSIS — F332 Major depressive disorder, recurrent severe without psychotic features: Secondary | ICD-10-CM | POA: Diagnosis not present

## 2016-08-24 DIAGNOSIS — Z046 Encounter for general psychiatric examination, requested by authority: Secondary | ICD-10-CM | POA: Insufficient documentation

## 2016-08-24 DIAGNOSIS — R451 Restlessness and agitation: Secondary | ICD-10-CM | POA: Diagnosis present

## 2016-08-24 LAB — CBC WITH DIFFERENTIAL/PLATELET
Basophils Absolute: 0.1 K/uL (ref 0.0–0.1)
Basophils Relative: 1 %
Eosinophils Absolute: 0.6 K/uL (ref 0.0–0.7)
Eosinophils Relative: 5 %
HCT: 46.2 % (ref 39.0–52.0)
Hemoglobin: 15.8 g/dL (ref 13.0–17.0)
Lymphocytes Relative: 36 %
Lymphs Abs: 4.2 K/uL — ABNORMAL HIGH (ref 0.7–4.0)
MCH: 30.8 pg (ref 26.0–34.0)
MCHC: 34.2 g/dL (ref 30.0–36.0)
MCV: 90.1 fL (ref 78.0–100.0)
Monocytes Absolute: 0.8 K/uL (ref 0.1–1.0)
Monocytes Relative: 7 %
Neutro Abs: 6 K/uL (ref 1.7–7.7)
Neutrophils Relative %: 51 %
Platelets: 248 K/uL (ref 150–400)
RBC: 5.13 MIL/uL (ref 4.22–5.81)
RDW: 13.5 % (ref 11.5–15.5)
WBC: 11.7 K/uL — ABNORMAL HIGH (ref 4.0–10.5)

## 2016-08-24 LAB — BASIC METABOLIC PANEL
Anion gap: 10 (ref 5–15)
BUN: 8 mg/dL (ref 6–20)
CALCIUM: 9.5 mg/dL (ref 8.9–10.3)
CO2: 26 mmol/L (ref 22–32)
Chloride: 105 mmol/L (ref 101–111)
Creatinine, Ser: 0.91 mg/dL (ref 0.61–1.24)
GFR calc Af Amer: >60 mL/min (ref >60)
GLUCOSE: 95 mg/dL (ref 65–99)
POTASSIUM: 3.9 mmol/L (ref 3.5–5.1)
Sodium: 141 mmol/L (ref 135–145)

## 2016-08-24 LAB — RAPID URINE DRUG SCREEN, HOSP PERFORMED
Amphetamines: NOT DETECTED
Barbiturates: POSITIVE — AB
Benzodiazepines: POSITIVE — AB
Cocaine: NOT DETECTED
Opiates: NOT DETECTED
Tetrahydrocannabinol: POSITIVE — AB

## 2016-08-24 LAB — ETHANOL: Alcohol, Ethyl (B): <5 mg/dL (ref <5)

## 2016-08-24 MED ORDER — NICOTINE 14 MG/24HR TD PT24
14.0000 mg | MEDICATED_PATCH | Freq: Every day | TRANSDERMAL | Status: DC
  Administered 2016-08-25: 14 mg via TRANSDERMAL
  Filled 2016-08-24: qty 1

## 2016-08-24 MED ORDER — DIAZEPAM 5 MG PO TABS: 10.0000 mg | ORAL_TABLET | Freq: Two times a day (BID) | ORAL | Status: DC | PRN

## 2016-08-24 MED ORDER — FLUTICASONE FUROATE-VILANTEROL 200-25 MCG/INH IN AEPB
1.0000 | INHALATION_SPRAY | Freq: Every day | RESPIRATORY_TRACT | Status: DC
  Administered 2016-08-25: 09:00:00 1 via RESPIRATORY_TRACT
  Filled 2016-08-24: qty 28

## 2016-08-24 MED ORDER — ONDANSETRON HCL 4 MG PO TABS: 4.0000 mg | ORAL_TABLET | Freq: Three times a day (TID) | ORAL | Status: DC | PRN

## 2016-08-24 MED ORDER — ALUM & MAG HYDROXIDE-SIMETH 200-200-20 MG/5ML PO SUSP: 30.0000 mL | Freq: Four times a day (QID) | ORAL | Status: DC | PRN

## 2016-08-24 MED ORDER — OXYCODONE-ACETAMINOPHEN 5-325 MG PO TABS
1.0000 | ORAL_TABLET | Freq: Once | ORAL | Status: AC
  Administered 2016-08-25: 1 via ORAL
  Filled 2016-08-24: qty 1

## 2016-08-24 MED ORDER — VENLAFAXINE HCL ER 37.5 MG PO CP24
75.0000 mg | ORAL_CAPSULE | Freq: Every day | ORAL | Status: DC
  Administered 2016-08-25: 75 mg via ORAL
  Filled 2016-08-24: qty 2

## 2016-08-24 MED ORDER — ZOLPIDEM TARTRATE 5 MG PO TABS: 5.0000 mg | ORAL_TABLET | Freq: Every evening | ORAL | Status: DC | PRN

## 2016-08-24 MED ORDER — DICLOFENAC SODIUM 75 MG PO TBEC
75.0000 mg | DELAYED_RELEASE_TABLET | Freq: Two times a day (BID) | ORAL | Status: DC
  Administered 2016-08-25 (×2): 75 mg via ORAL
  Filled 2016-08-24 (×5): qty 1

## 2016-08-24 MED ORDER — CARVEDILOL 12.5 MG PO TABS
6.2500 mg | ORAL_TABLET | Freq: Two times a day (BID) | ORAL | Status: DC
  Administered 2016-08-25: 6.25 mg via ORAL
  Filled 2016-08-24: qty 1

## 2016-08-24 MED ORDER — ACETAMINOPHEN 325 MG PO TABS: 650.0000 mg | ORAL_TABLET | ORAL | Status: DC | PRN

## 2016-08-24 MED ORDER — PANTOPRAZOLE SODIUM 40 MG PO TBEC
40.0000 mg | DELAYED_RELEASE_TABLET | Freq: Every day | ORAL | Status: DC
  Administered 2016-08-25: 40 mg via ORAL
  Filled 2016-08-24: qty 1

## 2016-08-24 NOTE — ED Triage Notes (Signed)
Pt denies any si/hi ideations at this time. IVC paperwork was taken out by wife who states he wants to jump off balcony to kill himself and he flushed all his medication. Pt states he flushed all his meds to save his marriage.

## 2016-08-24 NOTE — ED Notes (Signed)
Pt provided 2 frozen meal trays. Sitter at bedside.

## 2016-08-24 NOTE — ED Notes (Signed)
Pt wife & family here to see pt. [place] officer informed pt if any problems they would be made to leave. Security called to wand family.

## 2016-08-24 NOTE — ED Provider Notes (Signed)
[facility] DEPT Provider Note   CSN: [id number] Arrival date & time: 08/24/16  1922     History   Chief Complaint Chief Complaint  Patient presents with  . V70.1    HPI Marc Mayo is a 31 y.o. male.  The history is provided by the patient.  He was brought in by police under involuntary commitment. Apparently, at home, he had threatened to kill himself by jumping off of a balcony. Patient denies any statement of suicidal ideation and denies suicidal ideation. He also denies homicidal ideation, auditory or visual hallucinations. He states that he and his wife had gotten into an argument, and he stated that he would leave for a period of time in order to let things cool down. He claims compliance with his medications, but IVC paper states that he is not taking his medication and has flushed them down the toilet. He admits to recent marijuana use, but denies other drug use and denies ethanol use.  Past Medical History:  Diagnosis Date  . ADHD (attention deficit hyperactivity disorder)   . Bipolar affective disorder ([place])   . Chronic back pain   . Chronic eustachian tube dysfunction 11/2011  . Fatty liver   . GERD (gastroesophageal reflux disease)   . Headache(784.0)    migraines  . Hernia of abdominal wall   . History of gastric ulcer   . Hypertension   . IBS (irritable bowel syndrome)   . Sleep apnea    was taken off machine as breathing has gotten better    Patient Active Problem List   Diagnosis Date Noted  . Varicose vein of leg 04/22/2016  . BMI 37.0-37.9, adult 04/22/2016  . Mild episode of recurrent major depressive disorder ([place]) 02/18/2016  . GAD (generalized anxiety disorder) 02/18/2016  . Essential hypertension 01/08/2016  . Cough 06/18/2012  . Dyspnea 05/01/2012  . Fatty liver 04/06/2012  . Smoker 04/11/2011  . OSA (obstructive sleep apnea) 02/03/2011  . IBS 02/22/2010  . BIPOLAR AFFECTIVE DISORDER 12/08/2009  . GERD 12/08/2009  . FATTY LIVER  DISEASE 12/08/2009  . DIARRHEA, CHRONIC 12/08/2009  . ABDOMINAL PAIN 12/08/2009  . CHEST PAIN UNSPECIFIED 03/17/2008    Past Surgical History:  Procedure Laterality Date  . ESOPHAGOGASTRODUODENOSCOPY  2011   Dr. Oneida Alar: chronic non-H.pylori gastritis, normal small bowel biopsy   . MYRINGOTOMY WITH TUBE PLACEMENT  12/12/2011   Procedure: MYRINGOTOMY WITH TUBE PLACEMENT;  Surgeon: Jodi Marble, MD;  Location: [facility] [place];  Service: ENT;  Laterality: Bilateral;  Bilateral T Tubes  . NASAL ENDOSCOPY WITH EPISTAXIS CONTROL  12/12/2011   Procedure: NASAL ENDOSCOPY WITH EPISTAXIS CONTROL;  Surgeon: Jodi Marble, MD;  Location: [place];  Service: ENT;  Laterality: Bilateral;  Direct Nasal Endoscopy with Cautery of Eustachian Tube Tori, Bilateral  . TYMPANOSTOMY TUBE PLACEMENT     x 7       Home Medications    Prior to Admission medications   Medication Sig Start Date End Date Taking? Authorizing Provider  budesonide-formoterol (SYMBICORT) 160-4.5 MCG/ACT inhaler Inhale 2 puffs into the lungs 2 (two) times daily.   Yes [provider]  carvedilol (COREG) 6.25 MG tablet Take 6.25 mg by mouth 2 (two) times daily with a meal.   Yes [provider]  diazepam (VALIUM) 10 MG tablet Take 1 tablet by mouth every 12 (twelve) hours as needed for anxiety, sleep or muscle spasms. 08/16/16  Yes [provider]  diclofenac (VOLTAREN) 75 MG EC tablet Take  1 tablet by mouth 2 (two) times daily. 08/16/16  Yes [provider]  ibuprofen (ADVIL,MOTRIN) 200 MG tablet Take 400-800 mg by mouth every 6 (six) hours as needed.   Yes [provider]  omeprazole (PRILOSEC) 20 MG capsule Take 20 mg by mouth daily as needed (acid reflux).    Yes [provider]  venlafaxine XR (EFFEXOR-XR) 75 MG 24 hr capsule Take 1 capsule by mouth daily. 08/01/16 08/01/17 Yes [provider]    Family History Family History  Problem Relation  Age of Onset  . Arthritis Mother   . COPD Mother   . Depression Mother   . Diabetes Mother   . Hyperlipidemia Mother   . Alcohol abuse Father   . Cancer Father   . COPD Father   . Stroke Father   . Early death Brother   . Asthma Daughter   . Colon cancer Neg Hx     Social History Social History  Substance Use Topics  . Smoking status: Current Every Day Smoker    Packs/day: 1.50    Years: 11.00    Types: Cigarettes  . Smokeless tobacco: Never Used  . Alcohol use No     Allergies   Tramadol; Alprazolam; Aripiprazole; Celexa [citalopram hydrobromide]; Citalopram; Ketorolac; Lorazepam; Paxil  [paroxetine hcl]; Sertraline; Sertraline hcl; Zoloft  [sertraline hcl]; Fluoxetine; Ketorolac tromethamine; Paroxetine hcl; and Prozac [fluoxetine hcl]   Review of Systems Review of Systems  All other systems reviewed and are negative.    Physical Exam Updated Vital Signs BP 129/90 (BP Location: Right Arm)   Pulse 85   Temp 98.3 F (36.8 C) (Oral)   Resp 18   Ht 6\' 3"  (1.905 m)   Wt 132.5 kg (292 lb)   SpO2 97%   BMI 36.50 kg/m   Physical Exam  Nursing note and vitals reviewed.  31 year old male, resting comfortably and in no acute distress. Vital signs are normal. Oxygen saturation is 97%, which is normal. Head is normocephalic and atraumatic. PERRLA, EOMI. Oropharynx is clear. Neck is nontender and supple without adenopathy or JVD. Back is nontender and there is no CVA tenderness. Lungs are clear without rales, wheezes, or rhonchi. Chest is nontender. Heart has regular rate and rhythm without murmur. Abdomen is soft, flat, nontender without masses or hepatosplenomegaly and peristalsis is normoactive. Extremities have no cyanosis or edema, full range of motion is present. Skin is warm and dry without rash. Neurologic: Mental status is normal, cranial nerves are intact, there are no motor or sensory deficits.  ED Treatments / Results  Labs (all labs ordered are  listed, but only abnormal results are displayed) Labs Reviewed  CBC WITH DIFFERENTIAL/PLATELET - Abnormal; Notable for the following:       Result Value   WBC 11.7 (*)    Lymphs Abs 4.2 (*)    All other components within normal limits  RAPID URINE DRUG SCREEN, HOSP PERFORMED - Abnormal; Notable for the following:    Benzodiazepines POSITIVE (*)    Tetrahydrocannabinol POSITIVE (*)    Barbiturates POSITIVE (*)    All other components within normal limits  ETHANOL  BASIC METABOLIC PANEL   Procedures Procedures (including critical care time)  Medications Ordered in ED Medications  nicotine (NICODERM CQ - dosed in mg/24 hours) patch 14 mg (not administered)  alum & mag hydroxide-simeth (MAALOX/MYLANTA) 200-200-20 MG/5ML suspension 30 mL (not administered)  ondansetron (ZOFRAN) tablet 4 mg (not administered)  zolpidem (AMBIEN) tablet 5 mg (not administered)  acetaminophen (TYLENOL) tablet 650 mg (not administered)  fluticasone furoate-vilanterol (BREO ELLIPTA) 200-25 MCG/INH 1 puff (not administered)  carvedilol (COREG) tablet 6.25 mg (not administered)  diazepam (VALIUM) tablet 10 mg (not administered)  diclofenac (VOLTAREN) EC tablet 75 mg (not administered)  pantoprazole (PROTONIX) EC tablet 40 mg (not administered)  venlafaxine XR (EFFEXOR-XR) 24 hr capsule 75 mg (not administered)  oxyCODONE-acetaminophen (PERCOCET/ROXICET) 5-325 MG per tablet 1 tablet (not administered)     Initial Impression / Assessment and Plan / ED Course  I have reviewed the triage vital signs and the nursing notes.  Pertinent lab results that were available during my care of the patient were reviewed by me and considered in my medical decision making (see chart for details).  Report of agitation and suicidal ideation home. None evident on my exam. Old records are reviewed, and he has been seen at behavioral health as an outpatient. No record of any psychiatric admissions. He will be held in the ED for  evaluation by TTS.  TTS Consult appreciated. Patient will be kept in the ED for evaluation by psychiatrist in the morning.  Final Clinical Impressions(s) / ED Diagnoses   Final diagnoses:  Current severe episode of major depressive disorder without psychotic features without prior episode [facility])    New Prescriptions New Prescriptions   No medications on file     Delora Fuel, MD 29/02/11 [phone]

## 2016-08-24 NOTE — ED Notes (Addendum)
Pt states some family issues. Denies wanting to harm self or anyone else. States wife took papers out on him.  Per IVC paperwork pt threatened to jump off the balcony & kill himself. Very angry & not taking medications. Says pt flushed all his meds down the toilet.

## 2016-08-24 NOTE — ED Notes (Signed)
Family given visitor policy for pt. [place] officer remained w/ pt until they left.

## 2016-08-25 DIAGNOSIS — R45851 Suicidal ideations: Secondary | ICD-10-CM

## 2016-08-25 DIAGNOSIS — F17210 Nicotine dependence, cigarettes, uncomplicated: Secondary | ICD-10-CM | POA: Diagnosis not present

## 2016-08-25 DIAGNOSIS — Z630 Problems in relationship with spouse or partner: Secondary | ICD-10-CM

## 2016-08-25 DIAGNOSIS — Z811 Family history of alcohol abuse and dependence: Secondary | ICD-10-CM | POA: Diagnosis not present

## 2016-08-25 DIAGNOSIS — Z818 Family history of other mental and behavioral disorders: Secondary | ICD-10-CM

## 2016-08-25 DIAGNOSIS — F1290 Cannabis use, unspecified, uncomplicated: Secondary | ICD-10-CM

## 2016-08-25 DIAGNOSIS — F322 Major depressive disorder, single episode, severe without psychotic features: Secondary | ICD-10-CM | POA: Diagnosis not present

## 2016-08-25 MED ORDER — IBUPROFEN 800 MG PO TABS
800.0000 mg | ORAL_TABLET | Freq: Three times a day (TID) | ORAL | Status: DC | PRN
  Administered 2016-08-25: 800 mg via ORAL
  Filled 2016-08-25: qty 1

## 2016-08-25 MED ORDER — METHOCARBAMOL 500 MG PO TABS: 500.0000 mg | ORAL_TABLET | Freq: Two times a day (BID) | ORAL | 0 refills | Status: DC | PRN

## 2016-08-25 NOTE — ED Notes (Signed)
Pt just completed his TTS assessment.  Pt is now requested something else for pain in his back.

## 2016-08-25 NOTE — ED Provider Notes (Signed)
10:18 AM Assumed care from Dr. Roxanne Mins, please see their note for full history, physical and decision making until this point. In brief this is a 31 y.o. year old male who presented to the ED tonight with V70.1     Apparently had been involuntarily committed by his wife however the wife admitted to [place] counselor that the patient never said he wanted to kill himself. He said that he wanted to jump off a balcony because she wouldn't let him leave out the front door. This was more of an escape plan than was a suicide threat. On my exam the patient still denies suicidality and homicidality. He does not endorse any auditory or visual hallucinations. He is competent and does not seem to be responding to internal stimuli.[facility] counselor and myself both feel the patient is stable for discharge this time.  Discharge instructions, including strict return precautions for new or worsening symptoms, given. Patient and/or family verbalized understanding and agreement with the plan as described.   Labs, studies and imaging reviewed by myself and considered in medical decision making if ordered. Imaging interpreted by radiology.  Labs Reviewed  CBC WITH DIFFERENTIAL/PLATELET - Abnormal; Notable for the following:       Result Value   WBC 11.7 (*)    Lymphs Abs 4.2 (*)    All other components within normal limits  RAPID URINE DRUG SCREEN, HOSP PERFORMED - Abnormal; Notable for the following:    Benzodiazepines POSITIVE (*)    Tetrahydrocannabinol POSITIVE (*)    Barbiturates POSITIVE (*)    All other components within normal limits  ETHANOL  BASIC METABOLIC PANEL    No orders to display    No Follow-up on file.    Merrily Pew, MD 08/25/16 1020

## 2016-08-25 NOTE — Progress Notes (Signed)
Per Marvia Pickles, FNP, patient does not meet criteria for inpatient treatment.   Patient is recommended for discharge when medically cleared and to follow up with outpatient provider.    Marvia Pickles, FNP notified EDP and RN.    Radonna Ricker MSW, [place] Disposition [phone]

## 2016-08-25 NOTE — Consult Note (Signed)
Telepsych Consultation   Reason for Consult:  IVC reported to jump off of balcony Referring Physician:  Dr Dione Booze Patient Identification: Marc Mayo MRN:  [id number] Principal Diagnosis: <principal problem not specified> Diagnosis:   Patient Active Problem List   Diagnosis Date Noted  . Suicidal ideation [R45.851] 08/25/2016  . Varicose vein of leg [I83.90] 04/22/2016  . BMI 37.0-37.9, adult [Z68.37] 04/22/2016  . 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 GAD (generalized anxiety disorder) [F41.1] 02/18/2016  . Essential hypertension [I10] 01/08/2016  . Cough [R05] 06/18/2012  . Dyspnea [R06.00] 05/01/2012  . Fatty liver [K76.0] 04/06/2012  . Smoker [F17.200] 04/11/2011  . OSA (obstructive sleep apnea) [G47.33] 02/03/2011  . IBS [K58.9] 02/22/2010  . BIPOLAR AFFECTIVE DISORDER [F31.9] 12/08/2009  . GERD [K21.9] 12/08/2009  . FATTY LIVER DISEASE [K76.89] 12/08/2009  . DIARRHEA, CHRONIC [R19.7] 12/08/2009  . ABDOMINAL PAIN [R10.9] 12/08/2009  . CHEST PAIN UNSPECIFIED [R07.9] 03/17/2008    Total Time spent with patient: 20 minutes  Subjective:   Marc Mayo is a 31 y.o. male patient admitted with SI and threat to jump off of a balcony. Patient denies all of the accusations. He reports that he and his wife were arguing and it was upsetting the children. He attempted to leave the apartment and the wife got in front of the door and he threatened to jump off the balcony to leave. He reports talking to his wife this morning and they were okay. He said "she just wanted me to come here and cool down." He granted permission to call his wife. He states that he will be going back to his apartment upon discharge. He denies any SI/HI/AVH.   Objective: Patient has pleasant mood and appropriate affect. Spoke to his wife Marc Mayo (the petitioner) and she stated she was ready for him to come home and that they had just gotten into an argument.  The argument was in front of the children and that upset her. When asked about the threat to jump off the balcony to kill himself, she reports that he did not threaten to jump to kill himself, he threatened to jump off the balcony to get out of the apartment, because she was blocking the door and preventing him from leaving during the argument. "I was upset when I wrote that stuff on the paperwork. I just wanted him to calm down." She states that she hopes he has learned his lesson about arguing in front of the children. She feels safe for herself and the children when he is discharged. She feels he will be safe as well.    HPI:  Per Counselor notes: 31 y.o.married male, who was involuntarily committed and brought into [facility], by [employer]. Patient reported getting into an argument with his wife.  Patient stated that the argument occurred due to his wife having a child with his brother.  Patient stated that he felt he would be able to return home, due to his wife with their children to be with other family. Patient reported use of Cannabis on 08/20/2016, but reported that it was his first and last time in a while. Patient denies SI/HI/AVH, self-injurious behaviors or access to weapons.   08/24/2016: Per [facility] Commitment Affidavit-(Petitioner: Tarrin Lebow [phone]): Patient threatened to jump of the balcony to kill himself.   Patient was very angry and refused to take his medication for depression and hypertension.  Patient flushed all of medication down the toilet.  Patient was argumentative, resulting in one of the child in the home having an anxiety attack and EMS being called.  Patient reported currently being unemployed and receiving disability benefits.  Patient stated that he resides with his wife and 4 children. Patient stated that he has been referred to an outpatient facility for treatment and had an appointment on 08/24/2016, however did not go because he was in the  hospital.  During assessment, Patient was calm and cooperative.  Patient was dressed in scrubs and sitting up in his bed.  Patient was oriented to the time, place, location and situation.  Patient's eye contact was good and motor activity appeared to consist of freedom of movement.  Patient's speech was logical and coherent.  Patient's mood appeared to be depressed, with feelings of worthlessness, and low self-esteem.   Past Psychiatric History: Schizoaffective, MDD,   Risk to Self: Suicidal Ideation:  (Patient denies. Refer to IVC.) Suicidal Intent: Yes-Currently Present (Patient denies. Refer to IVC.) Is patient at risk for suicide?: Yes (Patient denies. Refer to IVC.) Suicidal Plan?: Yes-Currently Present (Patient denies. Refer to IVC.) Specify Current Suicidal Plan: Per IVC. Patient reported plan to jump off balcony. Access to Means: Yes Specify Access to Suicidal Means: Pt. reported having access to balcony What has been your use of drugs/alcohol within the last 12 months?: Pt. reported use of Cannabis 1x How many times?: 0 Other Self Harm Risks: Patient denies Triggers for Past Attempts: None known Intentional Self Injurious Behavior: None Risk to Others: Homicidal Ideation: No (Patient denies.) Thoughts of Harm to Others: No (Patient denies.) Current Homicidal Intent: No (Patient denies.) Current Homicidal Plan: No (Patient denies.) Access to Homicidal Means: No (Patient denies.) Identified Victim: Patient denies. History of harm to others?: No (Patient denies.) Assessment of Violence: On admission Violent Behavior Description: Patient denies. Does patient have access to weapons?: No (Patient denies.) Criminal Charges Pending?: No (Patient denies.) Does patient have a court date: No (Patient denies.) Prior Inpatient Therapy: Prior Inpatient Therapy: Yes Prior Therapy Dates: 2011 (Per medical records) Prior Therapy Facilty/Provider(s): [facility] Reason for Treatment:  Depression Prior Outpatient Therapy: Prior Outpatient Therapy: No Prior Therapy Dates: None Prior Therapy Facilty/Provider(s): None Reason for Treatment: None Does patient have an ACCT team?: No Does patient have Intensive In-House Services?  : No Does patient have [facility] services? : No Does patient have P4CC services?: No  Past Medical History:  Past Medical History:  Diagnosis Date  . ADHD (attention deficit hyperactivity disorder)   . Bipolar affective disorder ([place])   . Chronic back pain   . Chronic eustachian tube dysfunction 11/2011  . Fatty liver   . GERD (gastroesophageal reflux disease)   . Headache(784.0)    migraines  . Hernia of abdominal wall   . History of gastric ulcer   . Hypertension   . IBS (irritable bowel syndrome)   . Sleep apnea    was taken off machine as breathing has gotten better    Past Surgical History:  Procedure Laterality Date  . ESOPHAGOGASTRODUODENOSCOPY  2011   Dr. Oneida Alar: chronic non-H.pylori gastritis, normal small bowel biopsy   . MYRINGOTOMY WITH TUBE PLACEMENT  12/12/2011   Procedure: MYRINGOTOMY WITH TUBE PLACEMENT;  Surgeon: Jodi Marble, MD;  Location: [facility];  Service: ENT;  Laterality: Bilateral;  Bilateral T Tubes  . NASAL ENDOSCOPY WITH EPISTAXIS CONTROL  12/12/2011   Procedure: NASAL ENDOSCOPY WITH EPISTAXIS CONTROL;  Surgeon: Jodi Marble, MD;  Location: [facility];  Service: ENT;  Laterality: Bilateral;  Direct Nasal Endoscopy with Cautery of Eustachian Tube Tori, Bilateral  . TYMPANOSTOMY TUBE PLACEMENT     x 7   Family History:  Family History  Problem Relation Age of Onset  . Arthritis Mother   . COPD Mother   . Depression Mother   . Diabetes Mother   . Hyperlipidemia Mother   . Alcohol abuse Father   . Cancer Father   . COPD Father   . Stroke Father   . Early death Brother   . Asthma Daughter   . Colon cancer Neg Hx    Family Psychiatric  History: None reported Social  History:  History  Alcohol Use No     History  Drug Use  . Types: Marijuana    Social History   Social History  . Marital status: Married    Spouse name: N/A  . Number of children: N/A  . Years of education: N/A   Occupational History  . disability    Social History Main Topics  . Smoking status: Current Every Day Smoker    Packs/day: 1.50    Years: 11.00    Types: Cigarettes  . Smokeless tobacco: Never Used  . Alcohol use No  . Drug use: Yes    Types: Marijuana  . Sexual activity: Yes   Other Topics Concern  . None   Social History Narrative  . None   Additional Social History:    Allergies:   Allergies  Allergen Reactions  . Tramadol Itching  . Alprazolam Other (See Comments)    Other reaction(s): Agitation Mood swings, anger EXCESSIVE DROWSINESS, ANGRY  . Aripiprazole Hives  . Celexa [Citalopram Hydrobromide]     Other reaction(s): Hallucinations  . Citalopram Rash    Other reaction(s): Other (See Comments)  . Ketorolac Hives  . Lorazepam Other (See Comments)    Other reaction(s): Other (See Comments) HEADACHE HEADACHE HEADACHE  . Paxil  [Paroxetine Hcl] Other (See Comments)    Other reaction(s): Delusions (intolerance) "HAS BAD THOUGHTS" "HAS BAD THOUGHTS" Other reaction(s): Hallucinations  . Sertraline Other (See Comments)  . Sertraline Hcl Other (See Comments)    SUICIDAL  . Zoloft  [Sertraline Hcl]     Other reaction(s): Hallucinations  . Fluoxetine     Other reaction(s): Other (See Comments) "CAUSES BAD THOUGHTS AND DREAMS"  . Ketorolac Tromethamine Other (See Comments)    REACTION: Headaches, Blurred vision  . Paroxetine Hcl Other (See Comments)    "HAS BAD THOUGHTS"  . Prozac [Fluoxetine Hcl] Other (See Comments)    "CAUSES BAD THOUGHTS AND DREAMS"    Labs:  Results for orders placed or performed during the hospital encounter of 08/24/16 (from the past 48 hour(s))  Rapid urine drug screen (hospital performed)     Status:  Abnormal   Collection Time: 08/24/16  8:30 PM  Result Value Ref Range   Opiates NONE DETECTED NONE DETECTED   Cocaine NONE DETECTED NONE DETECTED   Benzodiazepines POSITIVE (A) NONE DETECTED   Amphetamines NONE DETECTED NONE DETECTED   Tetrahydrocannabinol POSITIVE (A) NONE DETECTED   Barbiturates POSITIVE (A) NONE DETECTED    Comment:        DRUG SCREEN FOR MEDICAL PURPOSES ONLY.  IF CONFIRMATION IS NEEDED FOR ANY PURPOSE, NOTIFY LAB WITHIN 5 DAYS.        LOWEST DETECTABLE LIMITS FOR URINE DRUG SCREEN Drug Class       Cutoff (ng/mL) Amphetamine      1000 Barbiturate  200 Benzodiazepine   923 Tricyclics       300 Opiates          300 Cocaine          300 THC              50   Ethanol     Status: None   Collection Time: 08/24/16  8:52 PM  Result Value Ref Range   Alcohol, Ethyl (B) <5 <5 mg/dL    Comment:        LOWEST DETECTABLE LIMIT FOR SERUM ALCOHOL IS 5 mg/dL FOR MEDICAL PURPOSES ONLY   CBC with Differential     Status: Abnormal   Collection Time: 08/24/16  8:52 PM  Result Value Ref Range   WBC 11.7 (H) 4.0 - 10.5 K/uL   RBC 5.13 4.22 - 5.81 MIL/uL   Hemoglobin 15.8 13.0 - 17.0 g/dL   HCT 46.2 39.0 - 52.0 %   MCV 90.1 78.0 - 100.0 fL   MCH 30.8 26.0 - 34.0 pg   MCHC 34.2 30.0 - 36.0 g/dL   RDW 13.5 11.5 - 15.5 %   Platelets 248 150 - 400 K/uL   Neutrophils Relative % 51 %   Neutro Abs 6.0 1.7 - 7.7 K/uL   Lymphocytes Relative 36 %   Lymphs Abs 4.2 (H) 0.7 - 4.0 K/uL   Monocytes Relative 7 %   Monocytes Absolute 0.8 0.1 - 1.0 K/uL   Eosinophils Relative 5 %   Eosinophils Absolute 0.6 0.0 - 0.7 K/uL   Basophils Relative 1 %   Basophils Absolute 0.1 0.0 - 0.1 K/uL  Basic metabolic panel     Status: None   Collection Time: 08/24/16  8:52 PM  Result Value Ref Range   Sodium 141 135 - 145 mmol/L   Potassium 3.9 3.5 - 5.1 mmol/L   Chloride 105 101 - 111 mmol/L   CO2 26 22 - 32 mmol/L   Glucose, Bld 95 65 - 99 mg/dL   BUN 8 6 - 20 mg/dL   Creatinine,  Ser 0.91 0.61 - 1.24 mg/dL   Calcium 9.5 8.9 - 10.3 mg/dL   GFR calc non Af Amer >60 >60 mL/min   GFR calc Af Amer >60 >60 mL/min    Comment: (NOTE) The eGFR has been calculated using the CKD EPI equation. This calculation has not been validated in all clinical situations. eGFR's persistently <60 mL/min signify possible Chronic Kidney Disease.    Anion gap 10 5 - 15    Current Facility-Administered Medications  Medication Dose Route Frequency Provider Last Rate Last Dose  . acetaminophen (TYLENOL) tablet 650 mg  650 mg Oral T6A PRN Delora Fuel, MD      . alum & mag hydroxide-simeth (MAALOX/MYLANTA) 200-200-20 MG/5ML suspension 30 mL  30 mL Oral U6J PRN Delora Fuel, MD      . carvedilol (COREG) tablet 6.25 mg  6.25 mg Oral BID WC Delora Fuel, MD   3.35 mg at 08/25/16 0907  . diazepam (VALIUM) tablet 10 mg  10 mg Oral K56Y PRN Delora Fuel, MD      . diclofenac (VOLTAREN) EC tablet 75 mg  75 mg Oral BID Delora Fuel, MD   75 mg at 08/25/16 0908  . fluticasone furoate-vilanterol (BREO ELLIPTA) 200-25 MCG/INH 1 puff  1 puff Inhalation Daily Delora Fuel, MD   1 puff at 08/25/16 0902  . ibuprofen (ADVIL,MOTRIN) tablet 800 mg  800 mg Oral TID PRN Delora Fuel, MD  800 mg at 08/25/16 0438  . nicotine (NICODERM CQ - dosed in mg/24 hours) patch 14 mg  14 mg Transdermal Daily Delora Fuel, MD   14 mg at 08/25/16 0907  . ondansetron (ZOFRAN) tablet 4 mg  4 mg Oral U7M PRN Delora Fuel, MD      . pantoprazole (PROTONIX) EC tablet 40 mg  40 mg Oral Daily Delora Fuel, MD   40 mg at 08/25/16 0908  . venlafaxine XR (EFFEXOR-XR) 24 hr capsule 75 mg  75 mg Oral Daily Delora Fuel, MD   75 mg at 08/25/16 0908  . zolpidem (AMBIEN) tablet 5 mg  5 mg Oral QHS PRN Delora Fuel, MD       Current Outpatient Prescriptions  Medication Sig Dispense Refill  . budesonide-formoterol (SYMBICORT) 160-4.5 MCG/ACT inhaler Inhale 2 puffs into the lungs 2 (two) times daily.    . carvedilol (COREG) 6.25 MG tablet Take  6.25 mg by mouth 2 (two) times daily with a meal.    . diazepam (VALIUM) 10 MG tablet Take 1 tablet by mouth every 12 (twelve) hours as needed for anxiety, sleep or muscle spasms.    . diclofenac (VOLTAREN) 75 MG EC tablet Take 1 tablet by mouth 2 (two) times daily.    Marland Kitchen ibuprofen (ADVIL,MOTRIN) 200 MG tablet Take 400-800 mg by mouth every 6 (six) hours as needed.    Marland Kitchen omeprazole (PRILOSEC) 20 MG capsule Take 20 mg by mouth daily as needed (acid reflux).     . venlafaxine XR (EFFEXOR-XR) 75 MG 24 hr capsule Take 1 capsule by mouth daily.      Musculoskeletal: Strength & Muscle Tone: within normal limits Gait & Station: normal Patient leans: N/A  Psychiatric Specialty Exam: Physical Exam  ROS  Blood pressure 122/89, pulse 70, temperature 98.2 F (36.8 C), temperature source Oral, resp. rate 18, height '6\' 3"'$  (1.905 m), weight 132.5 kg (292 lb), SpO2 97 %.Body mass index is 36.5 kg/m.  General Appearance: Casual  Eye Contact:  Good  Speech:  Clear and Coherent and Normal Rate  Volume:  Normal  Mood:  Euthymic  Affect:  Appropriate  Thought Process:  Coherent and Descriptions of Associations: Intact  Orientation:  Full (Time, Place, and Person)  Thought Content:  WDL  Suicidal Thoughts:  No  Homicidal Thoughts:  No  Memory:  Immediate;   Good Recent;   Good  Judgement:  Good  Insight:  Good  Psychomotor Activity:  Normal  Concentration:  Concentration: Good and Attention Span: Good  Recall:  Good  Fund of Knowledge:  Good  Language:  Good  Akathisia:  No  Handed:  Right  AIMS (if indicated):     Assets:  Financial Resources/Insurance Housing Social Support Transportation  ADL's:  Intact  Cognition:  WNL  Sleep:        Treatment Plan Summary: Suggest discharge home  Suggest continue current medications Suggest keep follow up appointments  Disposition: No evidence of imminent risk to self or others at present.   Patient does not meet criteria for psychiatric  inpatient admission.  [place], FNP 08/25/2016 10:11 AM
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
## 2016-08-26 DIAGNOSIS — K760 Fatty (change of) liver, not elsewhere classified: Secondary | ICD-10-CM | POA: Diagnosis not present

## 2016-08-26 DIAGNOSIS — K831 Obstruction of bile duct: Secondary | ICD-10-CM | POA: Diagnosis not present

## 2016-08-26 DIAGNOSIS — R1030 Lower abdominal pain, unspecified: Secondary | ICD-10-CM | POA: Diagnosis not present

## 2016-08-26 DIAGNOSIS — Z841 Family history of disorders of kidney and ureter: Secondary | ICD-10-CM | POA: Diagnosis not present

## 2016-08-26 DIAGNOSIS — Z79899 Other long term (current) drug therapy: Secondary | ICD-10-CM | POA: Diagnosis not present

## 2016-08-26 DIAGNOSIS — F17200 Nicotine dependence, unspecified, uncomplicated: Secondary | ICD-10-CM | POA: Diagnosis not present

## 2016-08-26 DIAGNOSIS — I10 Essential (primary) hypertension: Secondary | ICD-10-CM | POA: Diagnosis not present

## 2016-08-26 DIAGNOSIS — J449 Chronic obstructive pulmonary disease, unspecified: Secondary | ICD-10-CM | POA: Diagnosis not present

## 2016-08-26 DIAGNOSIS — M545 Low back pain: Secondary | ICD-10-CM | POA: Diagnosis not present

## 2016-08-26 DIAGNOSIS — Z87442 Personal history of urinary calculi: Secondary | ICD-10-CM | POA: Diagnosis not present

## 2016-08-26 DIAGNOSIS — R109 Unspecified abdominal pain: Secondary | ICD-10-CM | POA: Diagnosis not present

## 2016-08-26 DIAGNOSIS — S3991XA Unspecified injury of abdomen, initial encounter: Secondary | ICD-10-CM | POA: Diagnosis not present

## 2016-08-27 DIAGNOSIS — K219 Gastro-esophageal reflux disease without esophagitis: Secondary | ICD-10-CM | POA: Diagnosis not present

## 2016-08-27 DIAGNOSIS — Z720 Tobacco use: Secondary | ICD-10-CM | POA: Diagnosis not present

## 2016-08-27 DIAGNOSIS — F17210 Nicotine dependence, cigarettes, uncomplicated: Secondary | ICD-10-CM | POA: Diagnosis not present

## 2016-08-27 DIAGNOSIS — J42 Unspecified chronic bronchitis: Secondary | ICD-10-CM | POA: Diagnosis not present

## 2016-08-27 DIAGNOSIS — Z79899 Other long term (current) drug therapy: Secondary | ICD-10-CM | POA: Diagnosis not present

## 2016-08-27 DIAGNOSIS — R1084 Generalized abdominal pain: Secondary | ICD-10-CM | POA: Diagnosis not present

## 2016-08-27 DIAGNOSIS — M545 Low back pain: Secondary | ICD-10-CM | POA: Diagnosis not present

## 2016-08-27 DIAGNOSIS — Z9989 Dependence on other enabling machines and devices: Secondary | ICD-10-CM | POA: Diagnosis not present

## 2016-08-27 DIAGNOSIS — I10 Essential (primary) hypertension: Secondary | ICD-10-CM | POA: Diagnosis not present

## 2016-08-27 DIAGNOSIS — Z888 Allergy status to other drugs, medicaments and biological substances status: Secondary | ICD-10-CM | POA: Diagnosis not present

## 2016-08-27 DIAGNOSIS — Z7951 Long term (current) use of inhaled steroids: Secondary | ICD-10-CM | POA: Diagnosis not present

## 2016-08-27 DIAGNOSIS — G4733 Obstructive sleep apnea (adult) (pediatric): Secondary | ICD-10-CM | POA: Diagnosis not present

## 2016-08-27 DIAGNOSIS — Z885 Allergy status to narcotic agent status: Secondary | ICD-10-CM | POA: Diagnosis not present

## 2016-08-27 DIAGNOSIS — Z791 Long term (current) use of non-steroidal anti-inflammatories (NSAID): Secondary | ICD-10-CM | POA: Diagnosis not present

## 2016-08-27 DIAGNOSIS — R112 Nausea with vomiting, unspecified: Secondary | ICD-10-CM | POA: Diagnosis not present

## 2016-09-06 DIAGNOSIS — M25571 Pain in right ankle and joints of right foot: Secondary | ICD-10-CM | POA: Diagnosis not present

## 2016-09-06 DIAGNOSIS — F17200 Nicotine dependence, unspecified, uncomplicated: Secondary | ICD-10-CM | POA: Diagnosis not present

## 2016-09-06 DIAGNOSIS — M5126 Other intervertebral disc displacement, lumbar region: Secondary | ICD-10-CM | POA: Diagnosis not present

## 2016-09-06 DIAGNOSIS — M542 Cervicalgia: Secondary | ICD-10-CM | POA: Diagnosis not present

## 2016-09-06 DIAGNOSIS — S0990XA Unspecified injury of head, initial encounter: Secondary | ICD-10-CM | POA: Diagnosis not present

## 2016-09-06 DIAGNOSIS — S3992XA Unspecified injury of lower back, initial encounter: Secondary | ICD-10-CM | POA: Diagnosis not present

## 2016-09-06 DIAGNOSIS — S299XXA Unspecified injury of thorax, initial encounter: Secondary | ICD-10-CM | POA: Diagnosis not present

## 2016-09-06 DIAGNOSIS — M7989 Other specified soft tissue disorders: Secondary | ICD-10-CM | POA: Diagnosis not present

## 2016-09-06 DIAGNOSIS — J449 Chronic obstructive pulmonary disease, unspecified: Secondary | ICD-10-CM | POA: Diagnosis not present

## 2016-09-06 DIAGNOSIS — S199XXA Unspecified injury of neck, initial encounter: Secondary | ICD-10-CM | POA: Diagnosis not present

## 2016-09-06 DIAGNOSIS — M545 Low back pain: Secondary | ICD-10-CM | POA: Diagnosis not present

## 2016-09-06 DIAGNOSIS — W108XXA Fall (on) (from) other stairs and steps, initial encounter: Secondary | ICD-10-CM | POA: Diagnosis not present

## 2016-09-06 DIAGNOSIS — R52 Pain, unspecified: Secondary | ICD-10-CM | POA: Diagnosis not present

## 2016-09-06 DIAGNOSIS — M546 Pain in thoracic spine: Secondary | ICD-10-CM | POA: Diagnosis not present

## 2016-09-06 DIAGNOSIS — Z79899 Other long term (current) drug therapy: Secondary | ICD-10-CM | POA: Diagnosis not present

## 2016-09-08 DIAGNOSIS — M5126 Other intervertebral disc displacement, lumbar region: Secondary | ICD-10-CM | POA: Diagnosis not present

## 2016-09-22 DIAGNOSIS — Z888 Allergy status to other drugs, medicaments and biological substances status: Secondary | ICD-10-CM | POA: Diagnosis not present

## 2016-09-22 DIAGNOSIS — K219 Gastro-esophageal reflux disease without esophagitis: Secondary | ICD-10-CM | POA: Diagnosis not present

## 2016-09-22 DIAGNOSIS — F17210 Nicotine dependence, cigarettes, uncomplicated: Secondary | ICD-10-CM | POA: Diagnosis not present

## 2016-09-22 DIAGNOSIS — Z79899 Other long term (current) drug therapy: Secondary | ICD-10-CM | POA: Diagnosis not present

## 2016-09-22 DIAGNOSIS — M25551 Pain in right hip: Secondary | ICD-10-CM | POA: Diagnosis not present

## 2016-09-22 DIAGNOSIS — M47817 Spondylosis without myelopathy or radiculopathy, lumbosacral region: Secondary | ICD-10-CM | POA: Diagnosis not present

## 2016-09-22 DIAGNOSIS — W010XXA Fall on same level from slipping, tripping and stumbling without subsequent striking against object, initial encounter: Secondary | ICD-10-CM | POA: Diagnosis not present

## 2016-09-22 DIAGNOSIS — M545 Low back pain: Secondary | ICD-10-CM | POA: Diagnosis not present

## 2016-09-22 DIAGNOSIS — M79604 Pain in right leg: Secondary | ICD-10-CM | POA: Diagnosis not present

## 2016-09-22 DIAGNOSIS — M47897 Other spondylosis, lumbosacral region: Secondary | ICD-10-CM | POA: Diagnosis not present

## 2016-09-22 DIAGNOSIS — I10 Essential (primary) hypertension: Secondary | ICD-10-CM | POA: Diagnosis not present

## 2016-09-22 DIAGNOSIS — S79911A Unspecified injury of right hip, initial encounter: Secondary | ICD-10-CM | POA: Diagnosis not present

## 2016-09-22 DIAGNOSIS — M25552 Pain in left hip: Secondary | ICD-10-CM | POA: Diagnosis not present

## 2016-09-29 DIAGNOSIS — Z6841 Body Mass Index (BMI) 40.0 and over, adult: Secondary | ICD-10-CM | POA: Diagnosis not present

## 2016-09-29 DIAGNOSIS — M47816 Spondylosis without myelopathy or radiculopathy, lumbar region: Secondary | ICD-10-CM | POA: Diagnosis not present

## 2016-09-29 DIAGNOSIS — M4696 Unspecified inflammatory spondylopathy, lumbar region: Secondary | ICD-10-CM | POA: Diagnosis not present

## 2016-09-29 DIAGNOSIS — M47897 Other spondylosis, lumbosacral region: Secondary | ICD-10-CM | POA: Diagnosis not present

## 2016-09-29 DIAGNOSIS — F17200 Nicotine dependence, unspecified, uncomplicated: Secondary | ICD-10-CM | POA: Diagnosis not present

## 2016-09-29 DIAGNOSIS — Z713 Dietary counseling and surveillance: Secondary | ICD-10-CM | POA: Diagnosis not present

## 2016-09-29 DIAGNOSIS — G8929 Other chronic pain: Secondary | ICD-10-CM | POA: Diagnosis not present

## 2016-09-29 DIAGNOSIS — M47896 Other spondylosis, lumbar region: Secondary | ICD-10-CM | POA: Diagnosis not present

## 2016-09-29 DIAGNOSIS — M5417 Radiculopathy, lumbosacral region: Secondary | ICD-10-CM | POA: Diagnosis not present

## 2016-09-29 DIAGNOSIS — M1288 Other specific arthropathies, not elsewhere classified, other specified site: Secondary | ICD-10-CM | POA: Diagnosis not present

## 2016-09-29 DIAGNOSIS — M545 Low back pain: Secondary | ICD-10-CM | POA: Diagnosis not present

## 2016-09-29 DIAGNOSIS — M5136 Other intervertebral disc degeneration, lumbar region: Secondary | ICD-10-CM | POA: Diagnosis not present

## 2016-10-06 DIAGNOSIS — M5126 Other intervertebral disc displacement, lumbar region: Secondary | ICD-10-CM | POA: Diagnosis not present

## 2016-10-24 ENCOUNTER — Ambulatory Visit: Payer: Medicare Other | Admitting: Pediatrics

## 2016-11-03 DIAGNOSIS — M5126 Other intervertebral disc displacement, lumbar region: Secondary | ICD-10-CM | POA: Diagnosis not present

## 2016-11-10 DIAGNOSIS — I10 Essential (primary) hypertension: Secondary | ICD-10-CM | POA: Diagnosis not present

## 2016-11-10 DIAGNOSIS — M545 Low back pain: Secondary | ICD-10-CM | POA: Diagnosis not present

## 2016-11-10 DIAGNOSIS — Z6837 Body mass index (BMI) 37.0-37.9, adult: Secondary | ICD-10-CM | POA: Diagnosis not present

## 2016-12-06 DIAGNOSIS — G8929 Other chronic pain: Secondary | ICD-10-CM | POA: Diagnosis not present

## 2016-12-06 DIAGNOSIS — F411 Generalized anxiety disorder: Secondary | ICD-10-CM | POA: Diagnosis not present

## 2016-12-06 DIAGNOSIS — S86891A Other injury of other muscle(s) and tendon(s) at lower leg level, right leg, initial encounter: Secondary | ICD-10-CM | POA: Diagnosis not present

## 2016-12-06 DIAGNOSIS — F3177 Bipolar disorder, in partial remission, most recent episode mixed: Secondary | ICD-10-CM | POA: Diagnosis not present

## 2016-12-06 DIAGNOSIS — F330 Major depressive disorder, recurrent, mild: Secondary | ICD-10-CM | POA: Diagnosis not present

## 2016-12-06 DIAGNOSIS — M5441 Lumbago with sciatica, right side: Secondary | ICD-10-CM | POA: Diagnosis not present

## 2016-12-17 ENCOUNTER — Other Ambulatory Visit: Payer: Self-pay

## 2016-12-17 ENCOUNTER — Emergency Department (HOSPITAL_COMMUNITY)
Admission: EM | Admit: 2016-12-17 | Discharge: 2016-12-18 | Disposition: A | Discharge status: Home / Self Care | Payer: Medicare Other | Attending: Emergency Medicine | Admitting: Emergency Medicine

## 2016-12-17 ENCOUNTER — Encounter (HOSPITAL_COMMUNITY): Payer: Self-pay | Admitting: Emergency Medicine

## 2016-12-17 DIAGNOSIS — I10 Essential (primary) hypertension: Secondary | ICD-10-CM | POA: Insufficient documentation

## 2016-12-17 DIAGNOSIS — R300 Dysuria: Secondary | ICD-10-CM | POA: Insufficient documentation

## 2016-12-17 DIAGNOSIS — M549 Dorsalgia, unspecified: Secondary | ICD-10-CM

## 2016-12-17 DIAGNOSIS — F17210 Nicotine dependence, cigarettes, uncomplicated: Secondary | ICD-10-CM | POA: Diagnosis not present

## 2016-12-17 DIAGNOSIS — Z79899 Other long term (current) drug therapy: Secondary | ICD-10-CM | POA: Insufficient documentation

## 2016-12-17 DIAGNOSIS — R1033 Periumbilical pain: Secondary | ICD-10-CM | POA: Diagnosis not present

## 2016-12-17 DIAGNOSIS — R109 Unspecified abdominal pain: Secondary | ICD-10-CM | POA: Diagnosis present

## 2016-12-17 MED ORDER — SODIUM CHLORIDE 0.9 % IV BOLUS (SEPSIS)
1000.0000 mL | Freq: Once | INTRAVENOUS | Status: AC
  Administered 2016-12-17: 1000 mL via INTRAVENOUS

## 2016-12-17 MED ORDER — ONDANSETRON HCL 4 MG/2ML IJ SOLN
4.0000 mg | Freq: Once | INTRAMUSCULAR | Status: AC
  Administered 2016-12-18: 4 mg via INTRAVENOUS
  Filled 2016-12-17: qty 2

## 2016-12-17 MED ORDER — FENTANYL CITRATE (PF) 100 MCG/2ML IJ SOLN
25.0000 ug | Freq: Once | INTRAMUSCULAR | Status: AC
  Administered 2016-12-18: 25 ug via INTRAVENOUS
  Filled 2016-12-17: qty 2

## 2016-12-17 NOTE — ED Triage Notes (Signed)
Back, abdomen, and groin pain started a week ago. Pt states he has had a history of kidney stones, last time was 4 years ago. Pt states he has had dark amber urine for the last week and a half. Pt states his pain is a level 8 on a scale of 0 to 10.

## 2016-12-18 ENCOUNTER — Emergency Department (HOSPITAL_COMMUNITY): Payer: Medicare Other

## 2016-12-18 DIAGNOSIS — R300 Dysuria: Secondary | ICD-10-CM | POA: Diagnosis not present

## 2016-12-18 DIAGNOSIS — R109 Unspecified abdominal pain: Secondary | ICD-10-CM | POA: Diagnosis not present

## 2016-12-18 LAB — COMPREHENSIVE METABOLIC PANEL
ALBUMIN: 3.7 g/dL (ref 3.5–5.0)
ALK PHOS: 74 U/L (ref 38–126)
ALT: 44 U/L (ref 17–63)
ANION GAP: 9 (ref 5–15)
AST: 43 U/L — AB (ref 15–41)
BILIRUBIN TOTAL: 0.5 mg/dL (ref 0.3–1.2)
BUN: 8 mg/dL (ref 6–20)
CALCIUM: 9.3 mg/dL (ref 8.9–10.3)
CO2: 23 mmol/L (ref 22–32)
Chloride: 106 mmol/L (ref 101–111)
Creatinine, Ser: 0.71 mg/dL (ref 0.61–1.24)
GFR calc Af Amer: >60 mL/min (ref >60)
GFR calc non Af Amer: >60 mL/min (ref >60)
GLUCOSE: 178 mg/dL — AB (ref 65–99)
Potassium: 3.4 mmol/L — ABNORMAL LOW (ref 3.5–5.1)
SODIUM: 138 mmol/L (ref 135–145)
Total Protein: 6.6 g/dL (ref 6.5–8.1)

## 2016-12-18 LAB — URINALYSIS, ROUTINE W REFLEX MICROSCOPIC
Bilirubin Urine: NEGATIVE
GLUCOSE, UA: NEGATIVE mg/dL
Hgb urine dipstick: NEGATIVE
KETONES UR: NEGATIVE mg/dL
LEUKOCYTES UA: NEGATIVE
NITRITE: NEGATIVE
PROTEIN: NEGATIVE mg/dL
Specific Gravity, Urine: 1.025 (ref 1.005–1.030)
pH: 5 (ref 5.0–8.0)

## 2016-12-18 LAB — CBC
HEMATOCRIT: 44.7 % (ref 39.0–52.0)
HEMOGLOBIN: 15 g/dL (ref 13.0–17.0)
MCH: 31.1 pg (ref 26.0–34.0)
MCHC: 33.6 g/dL (ref 30.0–36.0)
MCV: 92.5 fL (ref 78.0–100.0)
Platelets: 216 10*3/uL (ref 150–400)
RBC: 4.83 MIL/uL (ref 4.22–5.81)
RDW: 13 % (ref 11.5–15.5)
WBC: 8.3 10*3/uL (ref 4.0–10.5)

## 2016-12-18 LAB — LIPASE, BLOOD: Lipase: 44 U/L (ref 11–51)

## 2016-12-18 NOTE — Discharge Instructions (Signed)
At this time, there are no concerning results for your pain, including infection or something that would need surgery.  Continue to take your medications as prescribed. You may use Tylenol or ibuprofen as needed for pain. Make sure you are drinking plenty of water to stay well-hydrated, this will help with your urinary symptoms. Avoid things that include caffeine such as coffee, sodas, chocolate as this can worsen urinary symptoms. Follow-up with your primary care doctor if you are still experiencing this pain. Return to the emergency room if you develop persistent vomiting, persistent fevers despite medication, or any new or worsening symptoms.

## 2016-12-18 NOTE — ED Provider Notes (Signed)
[facility] EMERGENCY DEPARTMENT Provider Note   CSN: [id number] Arrival date & time: 12/17/16  2307     History   Chief Complaint Chief Complaint  Patient presents with  . Flank Pain    HPI Marc Mayo is a 31 y.o. male presenting with 1 week back, abdomen, and groin pain.  Patient states that for the past week, he has had bilateral flank, lower abdomen, and bilateral groin pain.  Pain is constant and sharp, worse with movement, eating, and urination.  He states it similar to the pain he had several years ago when he had a kidney stone.  He has been taking Tylenol and ibuprofen without improvement of pain.  Nothing has made his pain better.  He reports dark urine with possible hematuria, dysuria, and urinary frequency.  He denies fevers, chills, nausea, vomiting, or upper abdominal pain.  He denies abnormal bowel movements.  He denies history of abdominal surgeries, stating he still has his appendix and gallbladder.  HPI  Past Medical History:  Diagnosis Date  . ADHD (attention deficit hyperactivity disorder)   . Bipolar affective disorder ([place])   . Chronic back pain   . Chronic eustachian tube dysfunction 11/2011  . Fatty liver   . GERD (gastroesophageal reflux disease)   . Headache(784.0)    migraines  . Hernia of abdominal wall   . History of gastric ulcer   . Hypertension   . IBS (irritable bowel syndrome)   . Sleep apnea    was taken off machine as breathing has gotten better    Patient Active Problem List   Diagnosis Date Noted  . Suicidal ideation 08/25/2016  . Varicose vein of leg 04/22/2016  . BMI 37.0-37.9, adult 04/22/2016  . Mild episode of recurrent major depressive disorder ([place]) 02/18/2016  . GAD (generalized anxiety disorder) 02/18/2016  . Essential hypertension 01/08/2016  . Cough 06/18/2012  . Dyspnea 05/01/2012  . Fatty liver 04/06/2012  . Smoker 04/11/2011  . OSA (obstructive sleep apnea) 02/03/2011  . IBS 02/22/2010  . BIPOLAR AFFECTIVE  DISORDER 12/08/2009  . GERD 12/08/2009  . FATTY LIVER DISEASE 12/08/2009  . DIARRHEA, CHRONIC 12/08/2009  . ABDOMINAL PAIN 12/08/2009  . CHEST PAIN UNSPECIFIED 03/17/2008    Past Surgical History:  Procedure Laterality Date  . ESOPHAGOGASTRODUODENOSCOPY  2011   Dr. Oneida Alar: chronic non-H.pylori gastritis, normal small bowel biopsy   . MYRINGOTOMY WITH TUBE PLACEMENT  12/12/2011   Procedure: MYRINGOTOMY WITH TUBE PLACEMENT;  Surgeon: Jodi Marble, MD;  Location: [place];  Service: ENT;  Laterality: Bilateral;  Bilateral T Tubes  . NASAL ENDOSCOPY WITH EPISTAXIS CONTROL  12/12/2011   Procedure: NASAL ENDOSCOPY WITH EPISTAXIS CONTROL;  Surgeon: Jodi Marble, MD;  Location: [place];  Service: ENT;  Laterality: Bilateral;  Direct Nasal Endoscopy with Cautery of Eustachian Tube Tori, Bilateral  . TYMPANOSTOMY TUBE PLACEMENT     x 7       Home Medications    Prior to Admission medications   Medication Sig Start Date End Date Taking? Authorizing Provider  budesonide-formoterol (SYMBICORT) 160-4.5 MCG/ACT inhaler Inhale 2 puffs into the lungs 2 (two) times daily.    [provider]  carvedilol (COREG) 6.25 MG tablet Take 6.25 mg by mouth 2 (two) times daily with a meal.    [provider]  diazepam (VALIUM) 10 MG tablet Take 1 tablet by mouth every 12 (twelve) hours as needed for anxiety, sleep or muscle spasms. 08/16/16   [provider]  diclofenac (VOLTAREN) 75 MG EC tablet Take 1 tablet by mouth 2 (two) times daily. 08/16/16   [provider]  ibuprofen (ADVIL,MOTRIN) 200 MG tablet Take 400-800 mg by mouth every 6 (six) hours as needed.    [provider]  methocarbamol (ROBAXIN) 500 MG tablet Take 1 tablet (500 mg total) by mouth every 12 (twelve) hours as needed for muscle spasms. 08/25/16   Mesner, Corene Cornea, MD  omeprazole (PRILOSEC) 20 MG capsule Take 20 mg by mouth daily as needed (acid reflux).     [provider]  venlafaxine XR (EFFEXOR-XR) 75 MG 24 hr capsule Take 1 capsule by mouth daily. 08/01/16 08/01/17  [provider]    Family History Family History  Problem Relation Age of Onset  . Arthritis Mother   . COPD Mother   . Depression Mother   . Diabetes Mother   . Hyperlipidemia Mother   . Alcohol abuse Father   . Cancer Father   . COPD Father   . Stroke Father   . Early death Brother   . Asthma Daughter   . Colon cancer Neg Hx     Social History Social History   Tobacco Use  . Smoking status: Current Every Day Smoker    Packs/day: 1.50    Years: 11.00    Pack years: 16.50    Types: Cigarettes  . Smokeless tobacco: Never Used  Substance Use Topics  . Alcohol use: No  . Drug use: No     Allergies   Tramadol; Alprazolam; Aripiprazole; Celexa [citalopram hydrobromide]; Citalopram; Ketorolac; Lorazepam; Paxil  [paroxetine hcl]; Sertraline; Sertraline hcl; Zoloft  [sertraline hcl]; Fluoxetine; Ketorolac tromethamine; Paroxetine hcl; and Prozac [fluoxetine hcl]   Review of Systems Review of Systems  Gastrointestinal: Positive for abdominal pain.  Genitourinary: Positive for dysuria, flank pain, frequency and hematuria.  All other systems reviewed and are negative.    Physical Exam Updated Vital Signs BP 129/77   Pulse 92   Temp 98.3 F (36.8 C)   Resp 20   Ht 6\' 3"  (1.905 m)   Wt (!) 140.6 kg (310 lb)   SpO2 98%   BMI 38.75 kg/m   Physical Exam  Constitutional: He is oriented to person, place, and time. He appears well-developed and well-nourished. No distress.  HENT:  Head: Normocephalic and atraumatic.  Eyes: Conjunctivae and EOM are normal. Pupils are equal, round, and reactive to light.  Neck: Normal range of motion. Neck supple.  Cardiovascular: Normal rate, regular rhythm and intact distal pulses.  Pulmonary/Chest: Effort normal and breath sounds normal. No respiratory distress. He has no wheezes.  Abdominal: Soft. Bowel sounds  are normal. He exhibits no distension and no mass. There is tenderness. There is no rebound and no guarding.  TTP of lower abd, worse suprapubic. TTP bilateral flank.   Musculoskeletal: Normal range of motion.  Neurological: He is alert and oriented to person, place, and time.  Skin: Skin is warm and dry.  Psychiatric: He has a normal mood and affect.  Nursing note and vitals reviewed.    ED Treatments / Results  Labs (all labs ordered are listed, but only abnormal results are displayed) Labs Reviewed  COMPREHENSIVE METABOLIC PANEL - Abnormal; Notable for the following components:      Result Value   Potassium 3.4 (*)    Glucose, Bld 178 (*)    AST 43 (*)    All other components within normal limits  URINALYSIS, ROUTINE W REFLEX MICROSCOPIC  CBC  LIPASE, BLOOD    EKG  EKG Interpretation None       Radiology Ct Renal Stone Study  Result Date: 12/18/2016 CLINICAL DATA:  Flank pain for 1 week. EXAM: CT ABDOMEN AND PELVIS WITHOUT CONTRAST TECHNIQUE: Multidetector CT imaging of the abdomen and pelvis was performed following the standard protocol without IV contrast. COMPARISON:  CT 08/26/2016 FINDINGS: Lower chest: Mild right lower lobe atelectasis. No pleural fluid. Small nodule at the right lung base corresponds to fissural thickening. Hepatobiliary: The liver is enlarged with hepatic steatosis. Mild sparing adjacent gallbladder fossa. Gallbladder nondistended without calcified stone. No biliary dilatation. Pancreas: No ductal dilatation or inflammation. Spleen: Normal in size without focal abnormality. Adrenals/Urinary Tract: Normal adrenal glands. The kidneys are symmetric in size without stones or hydronephrosis. There is no perinephric stranding. Both ureters are decompressed without stones along the course. Urinary bladder is nondistended, no bladder stone or wall thickening. Stomach/Bowel: Stomach is distended with ingested contents. Appendix appears normal. No evidence of bowel  wall thickening, distention, or inflammatory changes. Vascular/Lymphatic: Minimal distal aortic atherosclerosis. No enlarged abdominal or pelvic lymph nodes. Reproductive: Prostate is unremarkable. Other: Fat within both inguinal canals, no bowel involvement. No free air, free fluid, or intra-abdominal fluid collection. Musculoskeletal: There are no acute or suspicious osseous abnormalities. IMPRESSION: 1. No renal stones or obstructive uropathy. No acute abnormality in the abdomen/pelvis. 2. Hepatic steatosis. Aortic Atherosclerosis (ICD10-I70.0), age advanced. 3. Fat containing bilateral inguinal hernias. Electronically Signed   By: Jeb Levering M.D.   On: 12/18/2016 01:12    Procedures Procedures (including critical care time)  Medications Ordered in ED Medications  sodium chloride 0.9 % bolus 1,000 mL (0 mLs Intravenous Stopped 12/18/16 0127)  ondansetron (ZOFRAN) injection 4 mg (4 mg Intravenous Given 12/18/16 0001)  fentaNYL (SUBLIMAZE) injection 25 mcg (25 mcg Intravenous Given 12/18/16 0001)     Initial Impression / Assessment and Plan / ED Course  I have reviewed the triage vital signs and the nursing notes.  Pertinent labs & imaging results that were available during my care of the patient were reviewed by me and considered in my medical decision making (see chart for details).     Patient presenting for evaluation of 1 week history of bilateral flank and groin pain.  Patient with associated dysuria.  Physical exam reassuring, patient is afebrile, appears nontoxic.  Abdominal exam with mild tenderness of the lower abdomen, no rigidity or distention.  Will obtain basic abdominal labs and UA.  Fluids, zofran, and fentanyl given for symptom control.  CT renal ordered to rule out stone.  Labs reassuring, lipase negative, no leukocytosis.  Creatinine stable.  UA without infection or blood.  CT without concerning abnormality including infection, obstruction, or perforation.  No signs of  stones.  Symptoms improved with medications.  Case discussed with attending, Dr. Serena Croissant evaluated the patient.  Discussed findings with patient.  Patient states he is out of his at home pain medication, needs a refill.  Discussed that we will not refill chronic pain medication from the ER.  Discussed pain is likely muscular.  Patient to follow-up with primary care for other management of pain.  At this time, patient appears safe for discharge.  Return precautions given.  Patient states he understands and agrees to plan.   Final Clinical Impressions(s) / ED Diagnoses   Final diagnoses:  Dysuria  Bilateral back pain, unspecified back location, unspecified chronicity    ED Discharge Orders    None  Franchot Heidelberg, PA-C 12/18/16 0133    Orpah Greek, MD 12/18/16 [phone]

## 2016-12-18 NOTE — ED Notes (Signed)
Pt transported to CT ?

## 2017-02-23 DIAGNOSIS — F17210 Nicotine dependence, cigarettes, uncomplicated: Secondary | ICD-10-CM | POA: Diagnosis not present

## 2017-02-23 DIAGNOSIS — I10 Essential (primary) hypertension: Secondary | ICD-10-CM | POA: Diagnosis not present

## 2017-02-23 DIAGNOSIS — F3177 Bipolar disorder, in partial remission, most recent episode mixed: Secondary | ICD-10-CM | POA: Diagnosis not present

## 2017-02-23 DIAGNOSIS — G8929 Other chronic pain: Secondary | ICD-10-CM | POA: Diagnosis not present

## 2017-02-23 DIAGNOSIS — K047 Periapical abscess without sinus: Secondary | ICD-10-CM | POA: Diagnosis not present

## 2017-02-23 DIAGNOSIS — M545 Low back pain: Secondary | ICD-10-CM | POA: Diagnosis not present

## 2017-04-18 DIAGNOSIS — I10 Essential (primary) hypertension: Secondary | ICD-10-CM | POA: Diagnosis not present

## 2017-04-18 DIAGNOSIS — S0592XA Unspecified injury of left eye and orbit, initial encounter: Secondary | ICD-10-CM | POA: Diagnosis not present

## 2017-04-18 DIAGNOSIS — Z23 Encounter for immunization: Secondary | ICD-10-CM | POA: Diagnosis not present

## 2017-04-19 DIAGNOSIS — H1132 Conjunctival hemorrhage, left eye: Secondary | ICD-10-CM | POA: Diagnosis not present

## 2017-06-23 DIAGNOSIS — R109 Unspecified abdominal pain: Secondary | ICD-10-CM | POA: Diagnosis not present

## 2017-06-23 DIAGNOSIS — R1084 Generalized abdominal pain: Secondary | ICD-10-CM | POA: Diagnosis not present

## 2017-06-23 DIAGNOSIS — Z79899 Other long term (current) drug therapy: Secondary | ICD-10-CM | POA: Diagnosis not present

## 2017-06-23 DIAGNOSIS — K5730 Diverticulosis of large intestine without perforation or abscess without bleeding: Secondary | ICD-10-CM | POA: Diagnosis not present

## 2017-06-23 DIAGNOSIS — F17210 Nicotine dependence, cigarettes, uncomplicated: Secondary | ICD-10-CM | POA: Diagnosis not present

## 2017-06-23 DIAGNOSIS — K429 Umbilical hernia without obstruction or gangrene: Secondary | ICD-10-CM | POA: Diagnosis not present

## 2017-06-23 DIAGNOSIS — K219 Gastro-esophageal reflux disease without esophagitis: Secondary | ICD-10-CM | POA: Diagnosis not present

## 2017-06-23 DIAGNOSIS — Z885 Allergy status to narcotic agent status: Secondary | ICD-10-CM | POA: Diagnosis not present

## 2017-06-23 DIAGNOSIS — Z87442 Personal history of urinary calculi: Secondary | ICD-10-CM | POA: Diagnosis not present

## 2017-06-23 DIAGNOSIS — K4020 Bilateral inguinal hernia, without obstruction or gangrene, not specified as recurrent: Secondary | ICD-10-CM | POA: Diagnosis not present

## 2017-06-23 DIAGNOSIS — R7989 Other specified abnormal findings of blood chemistry: Secondary | ICD-10-CM | POA: Diagnosis not present

## 2017-06-23 DIAGNOSIS — Z888 Allergy status to other drugs, medicaments and biological substances status: Secondary | ICD-10-CM | POA: Diagnosis not present

## 2017-06-23 DIAGNOSIS — I10 Essential (primary) hypertension: Secondary | ICD-10-CM | POA: Diagnosis not present

## 2017-06-24 DIAGNOSIS — R1084 Generalized abdominal pain: Secondary | ICD-10-CM | POA: Diagnosis not present

## 2017-06-24 DIAGNOSIS — K4020 Bilateral inguinal hernia, without obstruction or gangrene, not specified as recurrent: Secondary | ICD-10-CM | POA: Diagnosis not present

## 2017-06-24 DIAGNOSIS — K5730 Diverticulosis of large intestine without perforation or abscess without bleeding: Secondary | ICD-10-CM | POA: Diagnosis not present

## 2017-06-24 DIAGNOSIS — K429 Umbilical hernia without obstruction or gangrene: Secondary | ICD-10-CM | POA: Diagnosis not present

## 2017-07-17 ENCOUNTER — Other Ambulatory Visit: Payer: Self-pay

## 2017-07-17 ENCOUNTER — Encounter (HOSPITAL_BASED_OUTPATIENT_CLINIC_OR_DEPARTMENT_OTHER): Payer: Self-pay | Admitting: *Deleted

## 2017-07-17 ENCOUNTER — Emergency Department (HOSPITAL_BASED_OUTPATIENT_CLINIC_OR_DEPARTMENT_OTHER)
Admission: EM | Admit: 2017-07-17 | Discharge: 2017-07-17 | Disposition: A | Discharge status: Home / Self Care | Payer: Medicare Other | Attending: Emergency Medicine | Admitting: Emergency Medicine

## 2017-07-17 DIAGNOSIS — F17210 Nicotine dependence, cigarettes, uncomplicated: Secondary | ICD-10-CM | POA: Diagnosis not present

## 2017-07-17 DIAGNOSIS — K4090 Unilateral inguinal hernia, without obstruction or gangrene, not specified as recurrent: Secondary | ICD-10-CM | POA: Insufficient documentation

## 2017-07-17 DIAGNOSIS — Z79899 Other long term (current) drug therapy: Secondary | ICD-10-CM | POA: Insufficient documentation

## 2017-07-17 DIAGNOSIS — I10 Essential (primary) hypertension: Secondary | ICD-10-CM | POA: Diagnosis not present

## 2017-07-17 DIAGNOSIS — R1030 Lower abdominal pain, unspecified: Secondary | ICD-10-CM | POA: Diagnosis present

## 2017-07-17 DIAGNOSIS — K4020 Bilateral inguinal hernia, without obstruction or gangrene, not specified as recurrent: Secondary | ICD-10-CM | POA: Diagnosis not present

## 2017-07-17 LAB — URINALYSIS, ROUTINE W REFLEX MICROSCOPIC
BILIRUBIN URINE: NEGATIVE
Glucose, UA: NEGATIVE mg/dL
HGB URINE DIPSTICK: NEGATIVE
Ketones, ur: NEGATIVE mg/dL
Leukocytes, UA: NEGATIVE
NITRITE: NEGATIVE
PROTEIN: NEGATIVE mg/dL
pH: 7 (ref 5.0–8.0)

## 2017-07-17 MED ORDER — HYDROCODONE-ACETAMINOPHEN 5-325 MG PO TABS
1.0000 | ORAL_TABLET | Freq: Once | ORAL | Status: AC
  Administered 2017-07-17: 1 via ORAL
  Filled 2017-07-17: qty 1

## 2017-07-17 MED ORDER — HYDROCODONE-ACETAMINOPHEN 5-325 MG PO TABS: 1.0000 | ORAL_TABLET | Freq: Four times a day (QID) | ORAL | 0 refills | Status: DC | PRN

## 2017-07-17 MED FILL — HYDROCODON-APAP 5-325: 5-325 | 3 days supply | Qty: 12 | Fill #0

## 2017-07-17 NOTE — ED Provider Notes (Signed)
[facility] EMERGENCY DEPARTMENT Provider Note   CSN: [id number] Arrival date & time: 07/17/17  1350     History   Chief Complaint Chief Complaint  Patient presents with  . Groin Pain    HPI Marc Mayo is a 32 y.o. male.  Marc Mayo is a 32 y.o. Male with a history of IBS, GERD, migraines, abdominal wall hernia, fatty liver, chronic back pain and bipolar affective disorder, who presents to the emergency department for evaluation of 5 days of pain over the right groin.  Patient reports pain was initially mild when he started but he is noticed a palpable knot over the groin.  He reports pain is now about a 7/10.  Pain is worse when he bends over or tries to urinate.  He reports history of kidney stones but this feels very different, has not noticed any blood in the urine, no flank pain, charge.Marland Kitchen  He denies fevers or chills, nausea vomiting or abdominal pain.  Patient was concerned that pain was getting worse over the past 5 days, and the knot seems to come and go, so he presented for evaluation.  Has not done anything for his symptoms prior to arrival, no other aggravating or alleviating factors.     Past Medical History:  Diagnosis Date  . ADHD (attention deficit hyperactivity disorder)   . Bipolar affective disorder ([place])   . Chronic back pain   . Chronic eustachian tube dysfunction 11/2011  . Fatty liver   . GERD (gastroesophageal reflux disease)   . Headache(784.0)    migraines  . Hernia of abdominal wall   . History of gastric ulcer   . Hypertension   . IBS (irritable bowel syndrome)   . Sleep apnea    was taken off machine as breathing has gotten better    Patient Active Problem List   Diagnosis Date Noted  . Suicidal ideation 08/25/2016  . Varicose vein of leg 04/22/2016  . BMI 37.0-37.9, adult 04/22/2016  . Mild episode of recurrent major depressive disorder ([place]) 02/18/2016  . GAD (generalized anxiety disorder) 02/18/2016  . Essential  hypertension 01/08/2016  . Cough 06/18/2012  . Dyspnea 05/01/2012  . Fatty liver 04/06/2012  . Smoker 04/11/2011  . OSA (obstructive sleep apnea) 02/03/2011  . IBS 02/22/2010  . BIPOLAR AFFECTIVE DISORDER 12/08/2009  . GERD 12/08/2009  . FATTY LIVER DISEASE 12/08/2009  . DIARRHEA, CHRONIC 12/08/2009  . ABDOMINAL PAIN 12/08/2009  . CHEST PAIN UNSPECIFIED 03/17/2008    Past Surgical History:  Procedure Laterality Date  . ESOPHAGOGASTRODUODENOSCOPY  2011   Dr. Oneida Alar: chronic non-H.pylori gastritis, normal small bowel biopsy   . MYRINGOTOMY WITH TUBE PLACEMENT  12/12/2011   Procedure: MYRINGOTOMY WITH TUBE PLACEMENT;  Surgeon: Jodi Marble, MD;  Location: [facility];  Service: ENT;  Laterality: Bilateral;  Bilateral T Tubes  . NASAL ENDOSCOPY WITH EPISTAXIS CONTROL  12/12/2011   Procedure: NASAL ENDOSCOPY WITH EPISTAXIS CONTROL;  Surgeon: Jodi Marble, MD;  Location: [facility];  Service: ENT;  Laterality: Bilateral;  Direct Nasal Endoscopy with Cautery of Eustachian Tube Tori, Bilateral  . TYMPANOSTOMY TUBE PLACEMENT     x 7        Home Medications    Prior to Admission medications   Medication Sig Start Date End Date Taking? Authorizing Provider  omeprazole (PRILOSEC) 20 MG capsule Take 20 mg by mouth daily as needed (acid reflux).    Yes [provider]  budesonide-formoterol (SYMBICORT) 160-4.5 MCG/ACT  inhaler Inhale 2 puffs into the lungs 2 (two) times daily.    [provider]  carvedilol (COREG) 6.25 MG tablet Take 6.25 mg by mouth 2 (two) times daily with a meal.    [provider]  diclofenac (VOLTAREN) 75 MG EC tablet Take 1 tablet by mouth 2 (two) times daily. 08/16/16   [provider]  HYDROcodone-acetaminophen (NORCO) 5-325 MG tablet Take 1 tablet by mouth every 6 (six) hours as needed. 07/17/17   Jacqlyn Larsen, PA-C  ibuprofen (ADVIL,MOTRIN) 200 MG tablet Take 400-800 mg by mouth every 6 (six) hours  as needed.    [provider]  methocarbamol (ROBAXIN) 500 MG tablet Take 1 tablet (500 mg total) by mouth every 12 (twelve) hours as needed for muscle spasms. 08/25/16   Mesner, Corene Cornea, MD  promethazine (PHENERGAN) 25 MG tablet Take 1 tablet (25 mg total) by mouth every 6 (six) hours as needed for nausea. 07/27/12 01/15/13  Cleatrice Burke, PA-C    Family History Family History  Problem Relation Age of Onset  . Arthritis Mother   . COPD Mother   . Depression Mother   . Diabetes Mother   . Hyperlipidemia Mother   . Alcohol abuse Father   . Cancer Father   . COPD Father   . Stroke Father   . Early death Brother   . Asthma Daughter   . Colon cancer Neg Hx     Social History Social History   Tobacco Use  . Smoking status: Current Every Day Smoker    Packs/day: 1.50    Years: 11.00    Pack years: 16.50    Types: Cigarettes  . Smokeless tobacco: Never Used  Substance Use Topics  . Alcohol use: No  . Drug use: No     Allergies   Tramadol; Alprazolam; Aripiprazole; Celexa [citalopram hydrobromide]; Citalopram; Ketorolac; Lorazepam; Paxil  [paroxetine hcl]; Sertraline; Sertraline hcl; Zoloft  [sertraline hcl]; Fluoxetine; Ketorolac tromethamine; Paroxetine hcl; and Prozac [fluoxetine hcl]   Review of Systems Review of Systems  Constitutional: Negative for chills and fever.  Respiratory: Negative for cough and shortness of breath.   Cardiovascular: Negative for chest pain.  Gastrointestinal: Negative for abdominal pain, blood in stool, constipation, diarrhea, nausea and vomiting.  Genitourinary: Negative for difficulty urinating, dysuria, flank pain, frequency, hematuria, penile swelling, scrotal swelling and testicular pain.       Right groin pain  Musculoskeletal: Negative for arthralgias, back pain and myalgias.  Skin: Negative for color change and rash.     Physical Exam Updated Vital Signs BP (!) 152/102   Pulse 82   Temp 98.5 F (36.9 C) (Oral)   Resp 18    Ht 6\' 3"  (1.905 m)   Wt 123.4 kg (272 lb)   SpO2 99%   BMI 34.00 kg/m   Physical Exam  Constitutional: He is oriented to person, place, and time. He appears well-developed and well-nourished. No distress.  HENT:  Head: Normocephalic and atraumatic.  Eyes: Right eye exhibits no discharge. Left eye exhibits no discharge.  Pulmonary/Chest: Effort normal. No respiratory distress.  Abdominal: Soft. Bowel sounds are normal. He exhibits no distension and no mass. There is no tenderness. There is no guarding.  Abdomen soft, nondistended, nontender to palpation in all quadrants without guarding or peritoneal signs, no CVA tenderness bilaterally  Genitourinary:  Genitourinary Comments: No obvious testicular or scrotal swelling on exam, no scrotal or testicular tenderness, there is tenderness at the outlet of the right inguinal canal, there  is a small palpable inguinal hernia that is only felt with cough or Valsalva  Neurological: He is alert and oriented to person, place, and time. Coordination normal.  Skin: Skin is warm and dry. Capillary refill takes less than 2 seconds. He is not diaphoretic.  Psychiatric: He has a normal mood and affect. His behavior is normal.  Nursing note and vitals reviewed.    ED Treatments / Results  Labs (all labs ordered are listed, but only abnormal results are displayed) Labs Reviewed  URINALYSIS, ROUTINE W REFLEX MICROSCOPIC - Abnormal; Notable for the following components:      Result Value   Specific Gravity, Urine <1.005 (*)    All other components within normal limits    EKG None  Radiology No results found.  Procedures Procedures (including critical care time)  Medications Ordered in ED Medications  HYDROcodone-acetaminophen (NORCO/VICODIN) 5-325 MG per tablet 1 tablet (1 tablet Oral Given 07/17/17 1459)     Initial Impression / Assessment and Plan / ED Course  I have reviewed the triage vital signs and the nursing notes.  Pertinent labs  & imaging results that were available during my care of the patient were reviewed by me and considered in my medical decision making (see chart for details).  Pt presents for evaluation of right groin pain, worse with forward bending, or bearing down to urinate, no scrotal swelling or testicular pain.  No dysuria.  Does not feel like his prior kidney stones.  No abdominal pain, vomiting or fevers.  Patient hypertensive, but vitals otherwise normal, and appears in no acute distress.  On exam abdomen is benign, there is a small palpable right inguinal hernia that is only felt with Valsalva, no evidence of incarceration.  Patient seen and evaluated by Dr. Darl Householder as well who is in agreement.  Do not feel the patient will require imaging or additional work-up today, urinalysis shows no dense of infection and no blood in the urine to suggest kidney stone.  Will treat with Norco as needed for pain and have patient follow-up with [facility] surgery.  Return precautions discussed.  Patient expresses understanding and is in agreement with plan.  Patient discussed with Dr. Darl Householder, who saw patient as well and agrees with plan.   Final Clinical Impressions(s) / ED Diagnoses   Final diagnoses:  Hernia, inguinal, right    ED Discharge Orders        Ordered    HYDROcodone-acetaminophen ([place]) 5-325 MG tablet  Every 6 hours PRN     07/17/17 1505       Jacqlyn Larsen, PA-C 07/17/17 1518    Drenda Freeze, MD 07/18/17 [phone]

## 2017-07-17 NOTE — ED Triage Notes (Signed)
Groin pain x 5 days. He has a knot.

## 2017-07-17 NOTE — Discharge Instructions (Signed)
Your exam shows a very small hernia that does not appear to be trapped or incarcerated.  You may use pain medicine as needed, no heavy lifting or strenuous activity.  You will need to follow-up with [facility] surgery for further evaluation and management.  Return to the emergency department if you notice large bulge or swelling in the scrotum that does not go away with laying down, significantly worsening pain, fevers, vomiting or any other new or concerning symptoms.

## 2017-07-19 ENCOUNTER — Other Ambulatory Visit: Payer: Self-pay

## 2017-07-19 ENCOUNTER — Emergency Department (HOSPITAL_BASED_OUTPATIENT_CLINIC_OR_DEPARTMENT_OTHER)
Admission: EM | Admit: 2017-07-19 | Discharge: 2017-07-20 | Disposition: A | Discharge status: Home / Self Care | Payer: Medicare Other | Attending: Emergency Medicine | Admitting: Emergency Medicine

## 2017-07-19 ENCOUNTER — Encounter (HOSPITAL_BASED_OUTPATIENT_CLINIC_OR_DEPARTMENT_OTHER): Payer: Self-pay | Admitting: Emergency Medicine

## 2017-07-19 DIAGNOSIS — F17210 Nicotine dependence, cigarettes, uncomplicated: Secondary | ICD-10-CM | POA: Diagnosis not present

## 2017-07-19 DIAGNOSIS — I10 Essential (primary) hypertension: Secondary | ICD-10-CM | POA: Insufficient documentation

## 2017-07-19 DIAGNOSIS — R1031 Right lower quadrant pain: Secondary | ICD-10-CM | POA: Diagnosis not present

## 2017-07-19 DIAGNOSIS — K4090 Unilateral inguinal hernia, without obstruction or gangrene, not specified as recurrent: Secondary | ICD-10-CM | POA: Insufficient documentation

## 2017-07-19 DIAGNOSIS — R1030 Lower abdominal pain, unspecified: Secondary | ICD-10-CM | POA: Diagnosis present

## 2017-07-19 NOTE — ED Triage Notes (Signed)
Pt states he was seen on the first and was diagnosed with a hernia on the left side  Pt says he has an appt set up with a surgeon  Pt states today the pain is worse and now he states he has noted some blood in his urine

## 2017-07-20 ENCOUNTER — Emergency Department (HOSPITAL_BASED_OUTPATIENT_CLINIC_OR_DEPARTMENT_OTHER): Payer: Medicare Other

## 2017-07-20 DIAGNOSIS — K4090 Unilateral inguinal hernia, without obstruction or gangrene, not specified as recurrent: Secondary | ICD-10-CM | POA: Diagnosis not present

## 2017-07-20 LAB — CBC WITH DIFFERENTIAL/PLATELET
Basophils Absolute: 0.1 10*3/uL (ref 0.0–0.1)
Basophils Relative: 1 %
EOS ABS: 0.8 10*3/uL — AB (ref 0.0–0.7)
Eosinophils Relative: 10 %
HEMATOCRIT: 43.7 % (ref 39.0–52.0)
HEMOGLOBIN: 15.6 g/dL (ref 13.0–17.0)
LYMPHS ABS: 3.7 10*3/uL (ref 0.7–4.0)
LYMPHS PCT: 42 %
MCH: 31.8 pg (ref 26.0–34.0)
MCHC: 35.7 g/dL (ref 30.0–36.0)
MCV: 89 fL (ref 78.0–100.0)
MONOS PCT: 6 %
Monocytes Absolute: 0.5 10*3/uL (ref 0.1–1.0)
NEUTROS PCT: 41 %
Neutro Abs: 3.5 10*3/uL (ref 1.7–7.7)
Platelets: 212 10*3/uL (ref 150–400)
RBC: 4.91 MIL/uL (ref 4.22–5.81)
RDW: 13.1 % (ref 11.5–15.5)
WBC: 8.6 10*3/uL (ref 4.0–10.5)

## 2017-07-20 LAB — COMPREHENSIVE METABOLIC PANEL
ALT: 17 U/L (ref 0–44)
AST: 19 U/L (ref 15–41)
Albumin: 3.8 g/dL (ref 3.5–5.0)
Alkaline Phosphatase: 68 U/L (ref 38–126)
Anion gap: 9 (ref 5–15)
BUN: 7 mg/dL (ref 6–20)
CHLORIDE: 106 mmol/L (ref 98–111)
CO2: 23 mmol/L (ref 22–32)
CREATININE: 0.71 mg/dL (ref 0.61–1.24)
Calcium: 8.5 mg/dL — ABNORMAL LOW (ref 8.9–10.3)
Glucose, Bld: 122 mg/dL — ABNORMAL HIGH (ref 70–99)
POTASSIUM: 3.1 mmol/L — AB (ref 3.5–5.1)
SODIUM: 138 mmol/L (ref 135–145)
Total Bilirubin: 0.4 mg/dL (ref 0.3–1.2)
Total Protein: 6.7 g/dL (ref 6.5–8.1)

## 2017-07-20 LAB — URINALYSIS, ROUTINE W REFLEX MICROSCOPIC
BILIRUBIN URINE: NEGATIVE
GLUCOSE, UA: NEGATIVE mg/dL
HGB URINE DIPSTICK: NEGATIVE
Ketones, ur: NEGATIVE mg/dL
Leukocytes, UA: NEGATIVE
Nitrite: NEGATIVE
PH: 6.5 (ref 5.0–8.0)
Protein, ur: NEGATIVE mg/dL

## 2017-07-20 MED ORDER — DOCUSATE SODIUM 100 MG PO CAPS: 100.0000 mg | ORAL_CAPSULE | Freq: Two times a day (BID) | ORAL | 0 refills | Status: DC

## 2017-07-20 MED ORDER — MORPHINE SULFATE (PF) 4 MG/ML IV SOLN
4.0000 mg | Freq: Once | INTRAVENOUS | Status: AC
  Administered 2017-07-20: 4 mg via INTRAVENOUS
  Filled 2017-07-20: qty 1

## 2017-07-20 MED ORDER — OXYCODONE-ACETAMINOPHEN 5-325 MG PO TABS
1.0000 | ORAL_TABLET | Freq: Once | ORAL | Status: AC
  Administered 2017-07-20: 1 via ORAL
  Filled 2017-07-20: qty 1

## 2017-07-20 MED ORDER — OXYCODONE-ACETAMINOPHEN 5-325 MG PO TABS: 1.0000 | ORAL_TABLET | Freq: Four times a day (QID) | ORAL | 0 refills | Status: DC | PRN

## 2017-07-20 NOTE — ED Provider Notes (Signed)
Emergency Department Provider Note   I have reviewed the triage vital signs and the nursing notes.   HISTORY  Chief Complaint Groin Pain   HPI Marc Mayo is a 32 y.o. male with PMH of GERD, HTN, and Bipolar disorder resents to the emergency department for evaluation of worsening right inguinal pain over the past several days.  The patient has had intermittent symptoms on that side and has establish care with a general surgeon.  He has an appointment later this month states over the past 2 days he has had worsening right inguinal pain with some swelling and has noticed some blood in the urine.  No pain with urination.  Denies any back or flank discomfort.  No fevers or chills.  No nausea or vomiting.  He continues to have bowel movements.   Past Medical History:  Diagnosis Date  . ADHD (attention deficit hyperactivity disorder)   . Bipolar affective disorder ([place])   . Chronic back pain   . Chronic eustachian tube dysfunction 11/2011  . Fatty liver   . GERD (gastroesophageal reflux disease)   . Headache(784.0)    migraines  . Hernia of abdominal wall   . History of gastric ulcer   . Hypertension   . IBS (irritable bowel syndrome)   . Sleep apnea    was taken off machine as breathing has gotten better    Patient Active Problem List   Diagnosis Date Noted  . Suicidal ideation 08/25/2016  . Varicose vein of leg 04/22/2016  . BMI 37.0-37.9, adult 04/22/2016  . Mild episode of recurrent major depressive disorder ([place]) 02/18/2016  . GAD (generalized anxiety disorder) 02/18/2016  . Essential hypertension 01/08/2016  . Cough 06/18/2012  . Dyspnea 05/01/2012  . Fatty liver 04/06/2012  . Smoker 04/11/2011  . OSA (obstructive sleep apnea) 02/03/2011  . IBS 02/22/2010  . BIPOLAR AFFECTIVE DISORDER 12/08/2009  . GERD 12/08/2009  . FATTY LIVER DISEASE 12/08/2009  . DIARRHEA, CHRONIC 12/08/2009  . ABDOMINAL PAIN 12/08/2009  . CHEST PAIN UNSPECIFIED 03/17/2008    Past  Surgical History:  Procedure Laterality Date  . ESOPHAGOGASTRODUODENOSCOPY  2011   Dr. Oneida Alar: chronic non-H.pylori gastritis, normal small bowel biopsy   . MYRINGOTOMY WITH TUBE PLACEMENT  12/12/2011   Procedure: MYRINGOTOMY WITH TUBE PLACEMENT;  Surgeon: Jodi Marble, MD;  Location: [facility];  Service: ENT;  Laterality: Bilateral;  Bilateral T Tubes  . NASAL ENDOSCOPY WITH EPISTAXIS CONTROL  12/12/2011   Procedure: NASAL ENDOSCOPY WITH EPISTAXIS CONTROL;  Surgeon: Jodi Marble, MD;  Location: [facility];  Service: ENT;  Laterality: Bilateral;  Direct Nasal Endoscopy with Cautery of Eustachian Tube Tori, Bilateral  . TYMPANOSTOMY TUBE PLACEMENT     x 7    Allergies Tramadol; Alprazolam; Aripiprazole; Celexa [citalopram hydrobromide]; Citalopram; Ketorolac; Lorazepam; Paxil  [paroxetine hcl]; Sertraline; Sertraline hcl; Zoloft  [sertraline hcl]; Fluoxetine; Ketorolac tromethamine; Paroxetine hcl; and Prozac [fluoxetine hcl]  Family History  Problem Relation Age of Onset  . Arthritis Mother   . COPD Mother   . Depression Mother   . Diabetes Mother   . Hyperlipidemia Mother   . Alcohol abuse Father   . Cancer Father   . COPD Father   . Stroke Father   . Early death Brother   . Asthma Daughter   . Colon cancer Neg Hx     Social History Social History   Tobacco Use  . Smoking status: Current Every Day Smoker    Packs/day: 1.50  Years: 11.00    Pack years: 16.50    Types: Cigarettes  . Smokeless tobacco: Never Used  Substance Use Topics  . Alcohol use: No  . Drug use: No    Review of Systems  Constitutional: No fever/chills Eyes: No visual changes. ENT: No sore throat. Cardiovascular: Denies chest pain. Respiratory: Denies shortness of breath. Gastrointestinal: Positive RLQ and right inguinal abdominal pain.  No nausea, no vomiting.  No diarrhea.  No constipation. Genitourinary: Negative for dysuria. Musculoskeletal: Negative for  back pain. Skin: Negative for rash. Neurological: Negative for headaches, focal weakness or numbness.  10-point ROS otherwise negative.  ____________________________________________   PHYSICAL EXAM:  VITAL SIGNS: ED Triage Vitals  Enc Vitals Group     BP 07/19/17 2348 (!) 152/115     Pulse Rate 07/19/17 2348 89     Resp 07/19/17 2348 18     Temp 07/19/17 2348 98.8 F (37.1 C)     Temp Source 07/19/17 2348 Oral     SpO2 07/19/17 2348 99 %     Weight 07/19/17 2350 272 lb (123.4 kg)     Height 07/19/17 2350 6\' 3"  (1.905 m)     Pain Score 07/19/17 2349 10   Constitutional: Alert and oriented. Well appearing and in no acute distress. Eyes: Conjunctivae are normal.  Head: Atraumatic. Nose: No congestion/rhinnorhea. Mouth/Throat: Mucous membranes are moist. Neck: No stridor.  Cardiovascular: Normal rate, regular rhythm. Good peripheral circulation. Grossly normal heart sounds.   Respiratory: Normal respiratory effort.  No retractions. Lungs CTAB. Gastrointestinal: Soft with mild RLQ tenderness. No distention.  Genitourinary: No testicular tenderness or masses.  Musculoskeletal: No lower extremity tenderness nor edema. No gross deformities of extremities. Neurologic:  Normal speech and language. No gross focal neurologic deficits are appreciated.  Skin:  Skin is warm, dry and intact. No rash noted.  ____________________________________________   LABS (all labs ordered are listed, but only abnormal results are displayed)  Labs Reviewed  COMPREHENSIVE METABOLIC PANEL - Abnormal; Notable for the following components:      Result Value   Potassium 3.1 (*)    Glucose, Bld 122 (*)    Calcium 8.5 (*)    All other components within normal limits  CBC WITH DIFFERENTIAL/PLATELET - Abnormal; Notable for the following components:   Eosinophils Absolute 0.8 (*)    All other components within normal limits  URINALYSIS, ROUTINE W REFLEX MICROSCOPIC - Abnormal; Notable for the following  components:   Specific Gravity, Urine <1.005 (*)    All other components within normal limits  URINE CULTURE   ____________________________________________  RADIOLOGY  Ct Renal Stone Study  Result Date: 07/20/2017 CLINICAL DATA:  Right inguinal and scrotal pain EXAM: CT ABDOMEN AND PELVIS WITHOUT CONTRAST TECHNIQUE: Multidetector CT imaging of the abdomen and pelvis was performed following the standard protocol without IV contrast. COMPARISON:  CT 12/18/2016 FINDINGS: Lower chest: Stable right lower lobe pulmonary nodule. No acute consolidation or effusion. Normal heart size. Hepatobiliary: No focal liver abnormality is seen. No gallstones, gallbladder wall thickening, or biliary dilatation. Pancreas: Unremarkable. No pancreatic ductal dilatation or surrounding inflammatory changes. Spleen: Normal in size without focal abnormality. Adrenals/Urinary Tract: Adrenal glands are within normal limits. No hydronephrosis. No ureteral stone. Bladder within normal limits Stomach/Bowel: Stomach is within normal limits. Appendix appears normal. No evidence of bowel wall thickening, distention, or inflammatory changes. Vascular/Lymphatic: Minimal aortic atherosclerosis. No aneurysmal dilatation. No significantly enlarged lymph nodes Reproductive: Prostate is unremarkable. Other: Moderate right greater than left fat containing inguinal  hernia. No bowel containing hernia. No inguinal mass. Musculoskeletal: No acute or significant osseous findings. IMPRESSION: 1. Negative for nephrolithiasis, hydronephrosis or ureteral stone 2. Moderate right greater than left fat containing inguinal hernia without bowel containing hernia Electronically Signed   By: Donavan Foil M.D.   On: 07/20/2017 01:01    ____________________________________________   PROCEDURES  Procedure(s) performed:   Procedures  None  ____________________________________________   INITIAL IMPRESSION / ASSESSMENT AND PLAN / ED COURSE  Pertinent  labs & imaging results that were available during my care of the patient were reviewed by me and considered in my medical decision making (see chart for details).  Patient presents to the emergency department for evaluation of right-sided abdominal and inguinal discomfort.  He has some mild fullness in the right inguinal canal but body habitus limits the exam slightly.  He has no testicular tenderness to palpation.  My suspicion for testicular torsion is very low.  Plan for CT abdomen pelvis to evaluate for possible inguinal hernia. No clinical concern for SBO.   01:20 AM CT scan shows fat-containing right inguinal hernia.  No bowel obstruction.  Labs reviewed with no acute findings.  I consulted the narcotic database from [place].  The patient did receive Vicodin prescription on 7/1 but this is not controlling his pain.  Plan to switch to oxycodone. Continue plan for outpatient surgery follow up.   At this time, I do not feel there is any life-threatening condition present. I have reviewed and discussed all results (EKG, imaging, lab, urine as appropriate), exam findings with patient. I have reviewed nursing notes and appropriate previous records.  I feel the patient is safe to be discharged home without further emergent workup. Discussed usual and customary return precautions. Patient and family (if present) verbalize understanding and are comfortable with this plan.  Patient will follow-up with their primary care provider. If they do not have a primary care provider, information for follow-up has been provided to them. All questions have been answered.  ____________________________________________  FINAL CLINICAL IMPRESSION(S) / ED DIAGNOSES  Final diagnoses:  Right inguinal hernia     MEDICATIONS GIVEN DURING THIS VISIT:  Medications  morphine 4 MG/ML injection 4 mg (4 mg Intravenous Given 07/20/17 0035)  oxyCODONE-acetaminophen (PERCOCET/ROXICET) 5-325 MG per tablet 1 tablet (1  tablet Oral Given 07/20/17 0135)     NEW OUTPATIENT MEDICATIONS STARTED DURING THIS VISIT:  Discharge Medication List as of 07/20/2017  1:28 AM    START taking these medications   Details  docusate sodium (COLACE) 100 MG capsule Take 1 capsule (100 mg total) by mouth every 12 (twelve) hours., Starting Thu 07/20/2017, Print    oxyCODONE-acetaminophen (PERCOCET/ROXICET) 5-325 MG tablet Take 1 tablet by mouth every 6 (six) hours as needed for severe pain., Starting Thu 07/20/2017, Print        Note:  This document was prepared using Dragon voice recognition software and may include unintentional dictation errors.  Nanda Quinton, MD Emergency Medicine    [facility] Caraher, [facility], MD 07/20/17 [phone]

## 2017-07-20 NOTE — ED Notes (Signed)
Patient transported to CT 

## 2017-07-20 NOTE — ED Notes (Signed)
ED Provider at bedside. 

## 2017-07-20 NOTE — Discharge Instructions (Signed)
As we discussed, your suffering from an intermittent inguinal hernia.  Please read through the included discharge instructions.  Although you do not need urgent or emergent surgery, it is very important that you follow-up with a surgeon as indicated in these documents to discuss definitive treatment of this condition with a surgical procedure. ° °Please apply pressure to the affected area with your hand when you are getting ready to cough, sneeze, get out of bed, or otherwise strain.  This may help prevent the hernia from "popping out".  He should also look at your local pharmacy to see if they have a binder or other holster-like device that applies constant pressure to the area. ° °Take regular over-the-counter pain medicine as indicated on the labels and any prescriptions provided today.  Follow-up as indicated in these documents.  Return to the emergency department if he develop new or worsening pain or if you are concerned that the hernia has come out and is stuck, resulting in nausea and vomiting, inability to have bowel movement or passed gas, fever or chills, or other symptoms that concern you. ° ° °Hernia °A hernia occurs when an internal organ pushes out through a weak spot in the abdominal wall. Hernias most commonly occur in the groin and around the navel. Hernias often can be pushed back into place (reduced). Most hernias tend to get worse over time. Some abdominal hernias can get stuck in the opening (irreducible or incarcerated hernia) and cannot be reduced. An irreducible abdominal hernia which is tightly squeezed into the opening is at risk for impaired blood supply (strangulated hernia). A strangulated hernia is a medical emergency. Because of the risk for an irreducible or strangulated hernia, surgery may be recommended to repair a hernia. °CAUSES  °Heavy lifting. °Prolonged coughing. °Straining to have a bowel movement. °A cut (incision) made during an abdominal surgery. °HOME CARE INSTRUCTIONS    °Bed rest is not required. You may continue your normal activities. °Avoid lifting more than 10 pounds (4.5 kg) or straining. °Cough gently. If you are a smoker it is best to stop. Even the best hernia repair can break down with the continual strain of coughing. Even if you do not have your hernia repaired, a cough will continue to aggravate the problem. °Do not wear anything tight over your hernia. Do not try to keep it in with an outside bandage or truss. These can damage abdominal contents if they are trapped within the hernia sac. °Eat a normal diet. °Avoid constipation. Straining over [place] periods of time will increase hernia size and encourage breakdown of repairs. If you cannot do this with diet alone, stool softeners may be used. °SEEK IMMEDIATE MEDICAL CARE IF:  °You have a fever. °You develop increasing abdominal pain. °You feel nauseous or vomit. °Your hernia is stuck outside the abdomen, looks discolored, feels hard, or is tender. °You have any changes in your bowel habits or in the hernia that are unusual for you. °You have increased pain or swelling around the hernia. °You cannot push the hernia back in place by applying gentle pressure while lying down. °MAKE SURE YOU:  °Understand these instructions. °Will watch your condition. °Will get help right away if you are not doing well or get worse. °Document Released: 01/03/2005 Document Revised: 03/28/2011 Document Reviewed: 08/23/2007 °[employer]® Patient Information ©2015 [employer]. This information is not intended to replace advice given to you by your health care provider. Make sure you discuss any questions you have with your health care   provider. ° °

## 2017-07-21 LAB — URINE CULTURE: Special Requests: NORMAL

## 2017-07-28 ENCOUNTER — Ambulatory Visit: Payer: Self-pay | Admitting: Surgery

## 2017-07-28 DIAGNOSIS — R1032 Left lower quadrant pain: Secondary | ICD-10-CM | POA: Diagnosis not present

## 2017-07-28 DIAGNOSIS — J42 Unspecified chronic bronchitis: Secondary | ICD-10-CM | POA: Diagnosis not present

## 2017-07-28 DIAGNOSIS — Z7951 Long term (current) use of inhaled steroids: Secondary | ICD-10-CM | POA: Diagnosis not present

## 2017-07-28 DIAGNOSIS — F17210 Nicotine dependence, cigarettes, uncomplicated: Secondary | ICD-10-CM | POA: Diagnosis not present

## 2017-07-28 DIAGNOSIS — Z888 Allergy status to other drugs, medicaments and biological substances status: Secondary | ICD-10-CM | POA: Diagnosis not present

## 2017-07-28 DIAGNOSIS — Z79899 Other long term (current) drug therapy: Secondary | ICD-10-CM | POA: Diagnosis not present

## 2017-07-28 DIAGNOSIS — K219 Gastro-esophageal reflux disease without esophagitis: Secondary | ICD-10-CM | POA: Diagnosis not present

## 2017-07-28 DIAGNOSIS — Z885 Allergy status to narcotic agent status: Secondary | ICD-10-CM | POA: Diagnosis not present

## 2017-07-28 DIAGNOSIS — Z9989 Dependence on other enabling machines and devices: Secondary | ICD-10-CM | POA: Diagnosis not present

## 2017-07-28 DIAGNOSIS — K4020 Bilateral inguinal hernia, without obstruction or gangrene, not specified as recurrent: Secondary | ICD-10-CM | POA: Diagnosis not present

## 2017-07-28 DIAGNOSIS — Z886 Allergy status to analgesic agent status: Secondary | ICD-10-CM | POA: Diagnosis not present

## 2017-07-28 DIAGNOSIS — R1031 Right lower quadrant pain: Secondary | ICD-10-CM | POA: Diagnosis not present

## 2017-07-28 DIAGNOSIS — I10 Essential (primary) hypertension: Secondary | ICD-10-CM | POA: Diagnosis not present

## 2017-07-28 DIAGNOSIS — G4733 Obstructive sleep apnea (adult) (pediatric): Secondary | ICD-10-CM | POA: Diagnosis not present

## 2017-07-28 DIAGNOSIS — Z791 Long term (current) use of non-steroidal anti-inflammatories (NSAID): Secondary | ICD-10-CM | POA: Diagnosis not present

## 2017-07-28 NOTE — H&P (View-Only) (Signed)
History of Present Illness Marc Mayo. Marc Fialkowski MD; 07/28/2017 9:49 AM) The patient is a 32 year old male who presents with an inguinal hernia. Referred by [name], PA-C for inguinal hernia This is a 32 year old male with morbid obesity and chronic tobacco abuse who presents with recent onset of right groin pain. This has become fairly severe. It improves when he is supine. He went to [facility] twice over the last couple of weeks with severe right groin pain. He has had a history kidney stones. They obtained a CT renal stone study that showed bilateral inguinal hernias right greater than left. The hernias containing only fat. In review of previous CT scans he did have smaller bilateral inguinal hernias over the last couple of years. He denies any obstructive symptoms. This patient smokes heavily using at least 2 packs per day. He is on disability for ADD (!) and does not work at all.   He has been diagnosed with obstructive sleep apnea but does not use a CPAP. He did have a vasectomy in 2016.  I performed a laparoscopic cholecystectomy on his wife couple of years ago.  CLINICAL DATA: Right inguinal and scrotal pain  EXAM: CT ABDOMEN AND PELVIS WITHOUT CONTRAST  TECHNIQUE: Multidetector CT imaging of the abdomen and pelvis was performed following the standard protocol without IV contrast.  COMPARISON: CT 12/18/2016  FINDINGS: Lower chest: Stable right lower lobe pulmonary nodule. No acute consolidation or effusion. Normal heart size.  Hepatobiliary: No focal liver abnormality is seen. No gallstones, gallbladder wall thickening, or biliary dilatation.  Pancreas: Unremarkable. No pancreatic ductal dilatation or surrounding inflammatory changes.  Spleen: Normal in size without focal abnormality.  Adrenals/Urinary Tract: Adrenal glands are within normal limits. No hydronephrosis. No ureteral stone. Bladder within normal limits  Stomach/Bowel: Stomach is within normal limits.  Appendix appears normal. No evidence of bowel wall thickening, distention, or inflammatory changes.  Vascular/Lymphatic: Minimal aortic atherosclerosis. No aneurysmal dilatation. No significantly enlarged lymph nodes  Reproductive: Prostate is unremarkable.  Other: Moderate right greater than left fat containing inguinal hernia. No bowel containing hernia. No inguinal mass.  Musculoskeletal: No acute or significant osseous findings.  IMPRESSION: 1. Negative for nephrolithiasis, hydronephrosis or ureteral stone 2. Moderate right greater than left fat containing inguinal hernia without bowel containing hernia   Electronically Signed By: Donavan Foil M.D. On: 07/20/2017 01:01   Past Surgical History Marc Lorenzo, LPN; 9/51/8841 6:60 AM) Oral Surgery  Tonsillectomy  Vasectomy   Medication History Marc Lorenzo, LPN; 07/17/1599 0:93 AM) Symbicort (160-4.5MCG/ACT Aerosol, Inhalation) Active. Omeprazole (20MG  Capsule DR, Oral) Active. Medications Reconciled  Social History Marc Lorenzo, LPN; 2/35/5732 2:02 AM) Caffeine use  Carbonated beverages. No alcohol use  No drug use  Tobacco use  Current every day smoker.  Family History Marc Lorenzo, LPN; 5/42/7062 3:76 AM) Alcohol Abuse  Father. Arthritis  Father, Mother. Cerebrovascular Accident  Father. Diabetes Mellitus  Mother. Hypertension  Father, Mother. Migraine Headache  Mother.  Other Problems Marc Lorenzo, LPN; 2/83/1517 6:16 AM) Anxiety Disorder  Back Pain  Depression  Gastric Ulcer  Gastroesophageal Reflux Disease  High blood pressure  Inguinal Hernia  Kidney Stone  Sleep Apnea     Review of Systems [name] LPN; 0/73/7106 2:69 AM) General Not Present- Appetite Loss, Chills, Fatigue, Fever, Night Sweats, Weight Gain and Weight Loss. Skin Not Present- Change in Wart/Mole, Dryness, Hives, Jaundice, New Lesions, Non-Healing Wounds, Rash and Ulcer. HEENT Present-  Wears glasses/contact lenses. Not Present- Earache, Hearing Loss, Hoarseness, Nose Bleed,  Oral Ulcers, Ringing in the Ears, Seasonal Allergies, Sinus Pain, Sore Throat, Visual Disturbances and Yellow Eyes. Respiratory Present- Snoring. Not Present- Bloody sputum, Chronic Cough, Difficulty Breathing and Wheezing. Breast Not Present- Breast Mass, Breast Pain, Nipple Discharge and Skin Changes. Cardiovascular Present- Chest Pain, Rapid Heart Rate and Shortness of Breath. Not Present- Difficulty Breathing Lying Down, Leg Cramps, Palpitations and Swelling of Extremities. Gastrointestinal Not Present- Abdominal Pain, Bloating, Bloody Stool, Change in Bowel Habits, Chronic diarrhea, Constipation, Difficulty Swallowing, Excessive gas, Gets full quickly at meals, Hemorrhoids, Indigestion, Nausea, Rectal Pain and Vomiting. Male Genitourinary Present- Blood in Urine. Not Present- Change in Urinary Stream, Frequency, Impotence, Nocturia, Painful Urination, Urgency and Urine Leakage. Musculoskeletal Present- Back Pain and Swelling of Extremities. Not Present- Joint Pain, Joint Stiffness, Muscle Pain and Muscle Weakness. Neurological Not Present- Decreased Memory, Fainting, Headaches, Numbness, Seizures, Tingling, Tremor, Trouble walking and Weakness. Psychiatric Present- Anxiety, Bipolar and Depression. Not Present- Change in Sleep Pattern, Fearful and Frequent crying. Endocrine Not Present- Cold Intolerance, Excessive Hunger, Hair Changes, Heat Intolerance, Hot flashes and New Diabetes. Hematology Not Present- Blood Thinners, Easy Bruising, Excessive bleeding, Gland problems, HIV and Persistent Infections.  Vitals [name] LPN; 1/60/7371 0:62 AM) 07/28/2017 9:23 AM Weight: 289.4 lb Height: 75in Body Surface Area: 2.57 m Body Mass Index: 36.17 kg/m  Temp.: 97.42F(Temporal)  Pulse: 88 (Regular)  BP: 148/96 (Sitting, Right Arm, Standard)       Physical Exam Rodman Key K. Talan Gildner MD;  07/28/2017 9:48 AM) The physical exam findings are as follows: Note:WDWN in NAD Eyes: Pupils equal, round; sclera anicteric HENT: Oral mucosa moist; good dentition Neck: No masses palpated, no thyromegaly Lungs: CTA bilaterally; normal respiratory effort CV: Regular rate and rhythm; no murmurs; extremities well-perfused with no edema Abd: +bowel sounds, obese, soft, non-tender, no palpable organomegaly; GU: bilateral descended testes; no testicular masses; slight right testicular tenderness Patient has a large amount of suprapubic and bilateral groin and adipose tissue. I cannot palpate an inguinal hernia when he is standing with Valsalva maneuver. Skin: Warm, dry; no sign of jaundice Psychiatric - alert and oriented x 4; calm mood and affect    Assessment & Plan Rodman Key K. Denim Start MD; 07/28/2017 9:50 AM) BILATERAL INGUINAL HERNIA WITHOUT OBSTRUCTION OR GANGRENE (K40.20) Current Plans Schedule for Surgery - Bilateral inguinal hernia repairs with mesh. The surgical procedure has been discussed with the patient. Potential risks, benefits, alternative treatments, and expected outcomes have been explained. All of the patient's questions at this time have been answered. The likelihood of reaching the patient's treatment goal is good. The patient understand the proposed surgical procedure and wishes to proceed. Note:The inguinal hernias are not palpable exteriorly because of the patient's body habitus. However they are easily visualized on the CT scan.  We discussed his chronic tobacco abuse and the fact that he might have on his healing after surgery. However he is fairly symptomatic on the right side. He will try to decrease his cigarette utilization of the next several weeks. We will need to keep him overnight because of the sleep apnea.  Marc Mayo. Georgette Dover, MD, [facility]  General/ Trauma Surgery  07/28/2017 9:52 AM

## 2017-07-28 NOTE — H&P (Signed)
History of Present Illness Marc Mayo. Marc Cuartas MD; 07/28/2017 9:49 AM) The patient is a 32 year old male who presents with an inguinal hernia. Referred by [name], PA-C for inguinal hernia This is a 32 year old male with morbid obesity and chronic tobacco abuse who presents with recent onset of right groin pain. This has become fairly severe. It improves when he is supine. He went to [facility] twice over the last couple of weeks with severe right groin pain. He has had a history kidney stones. They obtained a CT renal stone study that showed bilateral inguinal hernias right greater than left. The hernias containing only fat. In review of previous CT scans he did have smaller bilateral inguinal hernias over the last couple of years. He denies any obstructive symptoms. This patient smokes heavily using at least 2 packs per day. He is on disability for ADD (!) and does not work at all.   He has been diagnosed with obstructive sleep apnea but does not use a CPAP. He did have a vasectomy in 2016.  I performed a laparoscopic cholecystectomy on his wife couple of years ago.  CLINICAL DATA: Right inguinal and scrotal pain  EXAM: CT ABDOMEN AND PELVIS WITHOUT CONTRAST  TECHNIQUE: Multidetector CT imaging of the abdomen and pelvis was performed following the standard protocol without IV contrast.  COMPARISON: CT 12/18/2016  FINDINGS: Lower chest: Stable right lower lobe pulmonary nodule. No acute consolidation or effusion. Normal heart size.  Hepatobiliary: No focal liver abnormality is seen. No gallstones, gallbladder wall thickening, or biliary dilatation.  Pancreas: Unremarkable. No pancreatic ductal dilatation or surrounding inflammatory changes.  Spleen: Normal in size without focal abnormality.  Adrenals/Urinary Tract: Adrenal glands are within normal limits. No hydronephrosis. No ureteral stone. Bladder within normal limits  Stomach/Bowel: Stomach is within normal limits.  Appendix appears normal. No evidence of bowel wall thickening, distention, or inflammatory changes.  Vascular/Lymphatic: Minimal aortic atherosclerosis. No aneurysmal dilatation. No significantly enlarged lymph nodes  Reproductive: Prostate is unremarkable.  Other: Moderate right greater than left fat containing inguinal hernia. No bowel containing hernia. No inguinal mass.  Musculoskeletal: No acute or significant osseous findings.  IMPRESSION: 1. Negative for nephrolithiasis, hydronephrosis or ureteral stone 2. Moderate right greater than left fat containing inguinal hernia without bowel containing hernia   Electronically Signed By: Donavan Foil M.D. On: 07/20/2017 01:01   Past Surgical History [name], LPN; 10/06/1005 1:21 AM) Oral Surgery  Tonsillectomy  Vasectomy   Medication History [name], LPN; 9/75/8832 5:49 AM) Symbicort (160-4.5MCG/ACT Aerosol, Inhalation) Active. Omeprazole (20MG  Capsule DR, Oral) Active. Medications Reconciled  Social History [name], LPN; 09/11/4156 3:09 AM) Caffeine use  Carbonated beverages. No alcohol use  No drug use  Tobacco use  Current every day smoker.  Family History [name], LPN; 04/24/6806 8:11 AM) Alcohol Abuse  Father. Arthritis  Father, Mother. Cerebrovascular Accident  Father. Diabetes Mellitus  Mother. Hypertension  Father, Mother. Migraine Headache  Mother.  Other Problems [name], LPN; 0/31/5945 8:59 AM) Anxiety Disorder  Back Pain  Depression  Gastric Ulcer  Gastroesophageal Reflux Disease  High blood pressure  Inguinal Hernia  Kidney Stone  Sleep Apnea     Review of Systems [name] LPN; 2/92/4462 8:63 AM) General Not Present- Appetite Loss, Chills, Fatigue, Fever, Night Sweats, Weight Gain and Weight Loss. Skin Not Present- Change in Wart/Mole, Dryness, Hives, Jaundice, New Lesions, Non-Healing Wounds, Rash and Ulcer. HEENT Present-  Wears glasses/contact lenses. Not Present- Earache, Hearing Loss, Hoarseness, Nose Bleed,  Oral Ulcers, Ringing in the Ears, Seasonal Allergies, Sinus Pain, Sore Throat, Visual Disturbances and Yellow Eyes. Respiratory Present- Snoring. Not Present- Bloody sputum, Chronic Cough, Difficulty Breathing and Wheezing. Breast Not Present- Breast Mass, Breast Pain, Nipple Discharge and Skin Changes. Cardiovascular Present- Chest Pain, Rapid Heart Rate and Shortness of Breath. Not Present- Difficulty Breathing Lying Down, Leg Cramps, Palpitations and Swelling of Extremities. Gastrointestinal Not Present- Abdominal Pain, Bloating, Bloody Stool, Change in Bowel Habits, Chronic diarrhea, Constipation, Difficulty Swallowing, Excessive gas, Gets full quickly at meals, Hemorrhoids, Indigestion, Nausea, Rectal Pain and Vomiting. Male Genitourinary Present- Blood in Urine. Not Present- Change in Urinary Stream, Frequency, Impotence, Nocturia, Painful Urination, Urgency and Urine Leakage. Musculoskeletal Present- Back Pain and Swelling of Extremities. Not Present- Joint Pain, Joint Stiffness, Muscle Pain and Muscle Weakness. Neurological Not Present- Decreased Memory, Fainting, Headaches, Numbness, Seizures, Tingling, Tremor, Trouble walking and Weakness. Psychiatric Present- Anxiety, Bipolar and Depression. Not Present- Change in Sleep Pattern, Fearful and Frequent crying. Endocrine Not Present- Cold Intolerance, Excessive Hunger, Hair Changes, Heat Intolerance, Hot flashes and New Diabetes. Hematology Not Present- Blood Thinners, Easy Bruising, Excessive bleeding, Gland problems, HIV and Persistent Infections.  Vitals [name] LPN; 09/15/5619 3:08 AM) 07/28/2017 9:23 AM Weight: 289.4 lb Height: 75in Body Surface Area: 2.57 m Body Mass Index: 36.17 kg/m  Temp.: 97.41F(Temporal)  Pulse: 88 (Regular)  BP: 148/96 (Sitting, Right Arm, Standard)       Physical Exam Rodman Key K. Nolyn Swab MD;  07/28/2017 9:48 AM) The physical exam findings are as follows: Note:WDWN in NAD Eyes: Pupils equal, round; sclera anicteric HENT: Oral mucosa moist; good dentition Neck: No masses palpated, no thyromegaly Lungs: CTA bilaterally; normal respiratory effort CV: Regular rate and rhythm; no murmurs; extremities well-perfused with no edema Abd: +bowel sounds, obese, soft, non-tender, no palpable organomegaly; GU: bilateral descended testes; no testicular masses; slight right testicular tenderness Patient has a large amount of suprapubic and bilateral groin and adipose tissue. I cannot palpate an inguinal hernia when he is standing with Valsalva maneuver. Skin: Warm, dry; no sign of jaundice Psychiatric - alert and oriented x 4; calm mood and affect    Assessment & Plan Rodman Key K. Redding Cloe MD; 07/28/2017 9:50 AM) BILATERAL INGUINAL HERNIA WITHOUT OBSTRUCTION OR GANGRENE (K40.20) Current Plans Schedule for Surgery - Bilateral inguinal hernia repairs with mesh. The surgical procedure has been discussed with the patient. Potential risks, benefits, alternative treatments, and expected outcomes have been explained. All of the patient's questions at this time have been answered. The likelihood of reaching the patient's treatment goal is good. The patient understand the proposed surgical procedure and wishes to proceed. Note:The inguinal hernias are not palpable exteriorly because of the patient's body habitus. However they are easily visualized on the CT scan.  We discussed his chronic tobacco abuse and the fact that he might have on his healing after surgery. However he is fairly symptomatic on the right side. He will try to decrease his cigarette utilization of the next several weeks. We will need to keep him overnight because of the sleep apnea.  Marc Mayo. Georgette Dover, MD, [facility]  General/ Trauma Surgery  07/28/2017 9:52 AM

## 2017-07-29 IMAGING — DX DG LUMBAR SPINE COMPLETE 4+V
5 series · 5 of 5 positions shown · non-contrast
Comparison: 11/24/2015

CLINICAL DATA: Low back pain.  Status post fall.

EXAM:
LUMBAR SPINE - COMPLETE 4+ VIEW

[l-spine ap]
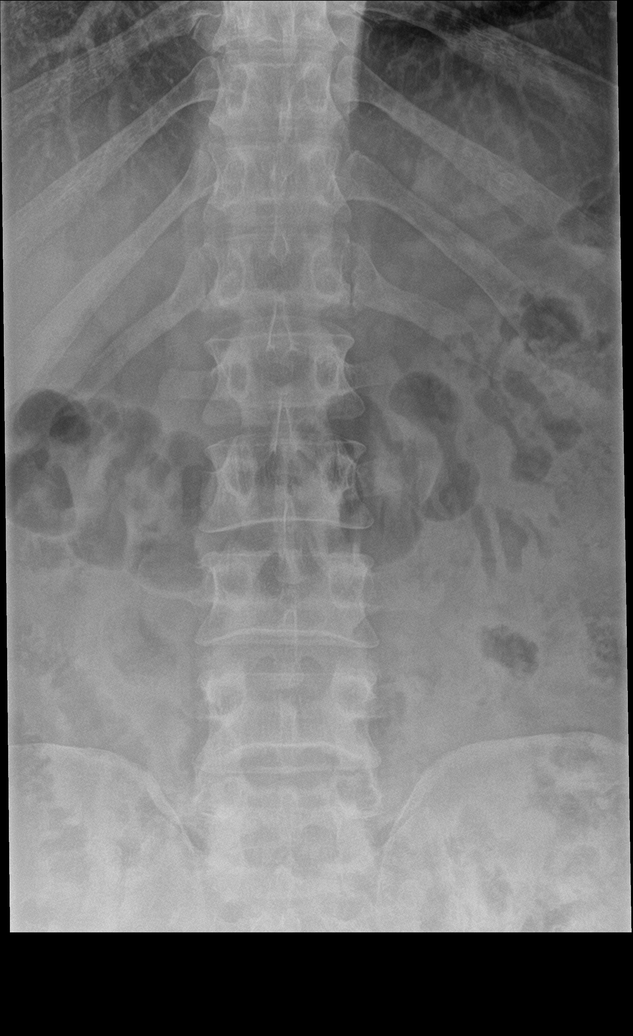

[l-spine obl (1 of 2)]
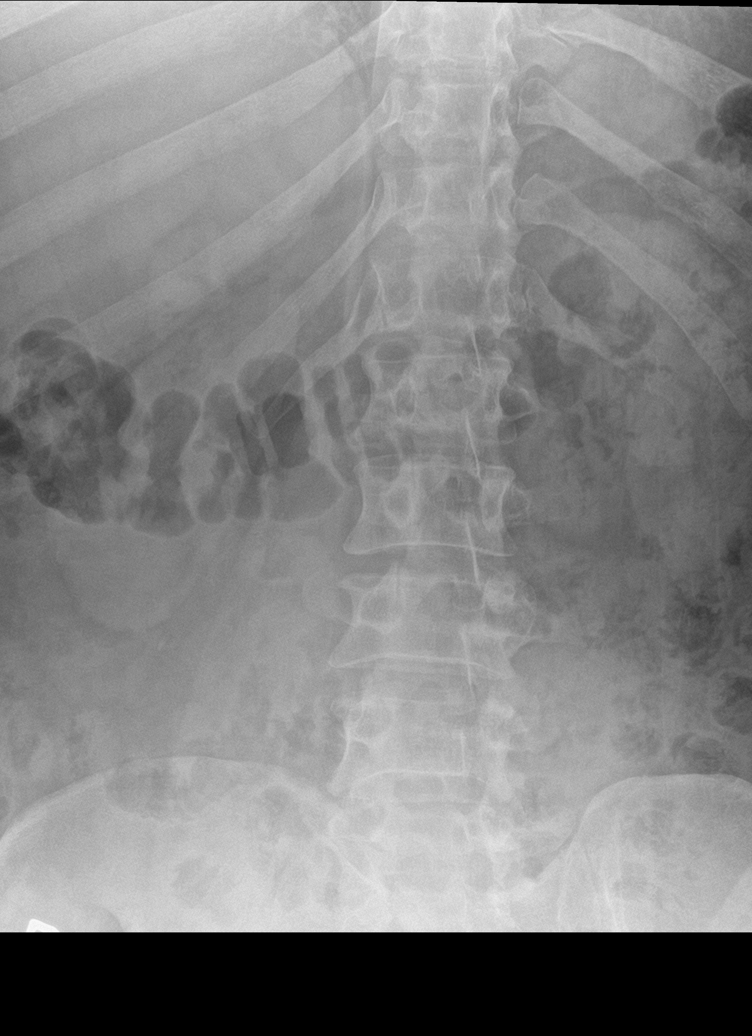

[l-spine spot]
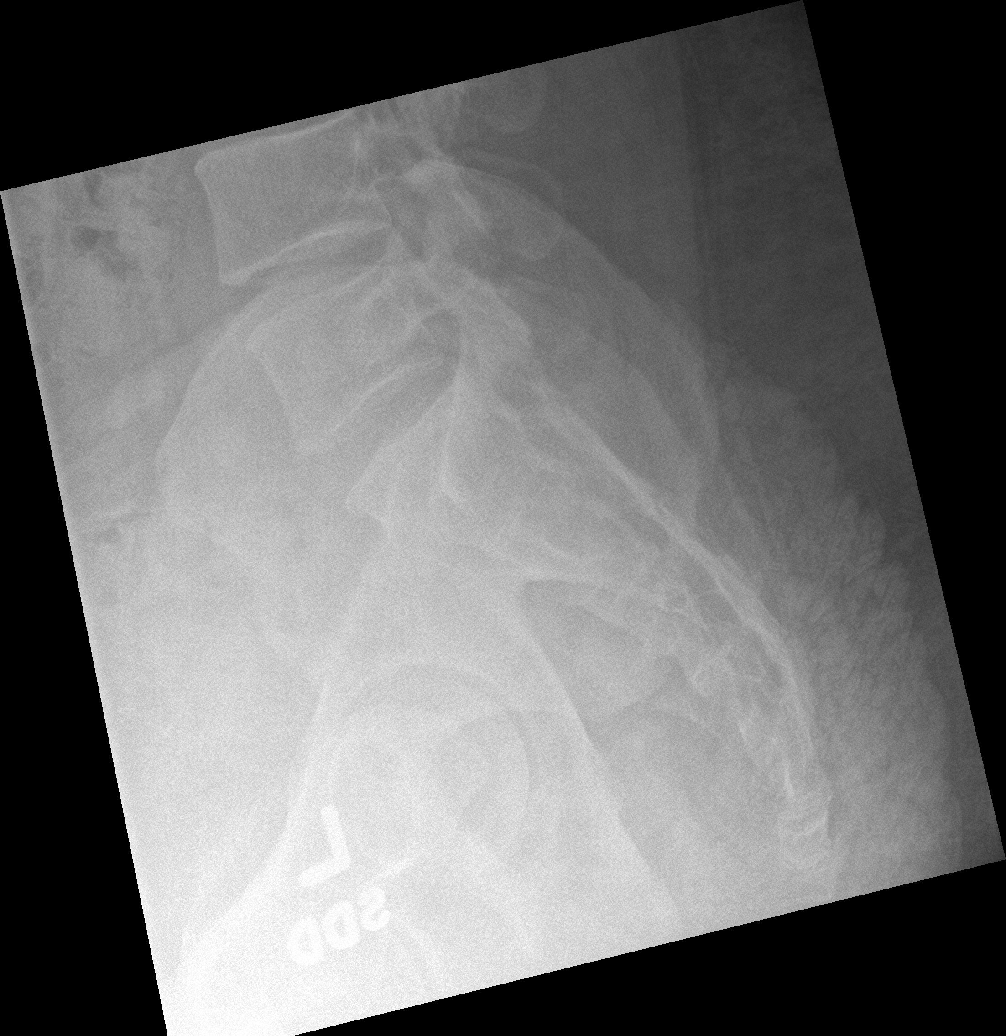

[l-spine obl (2 of 2)]
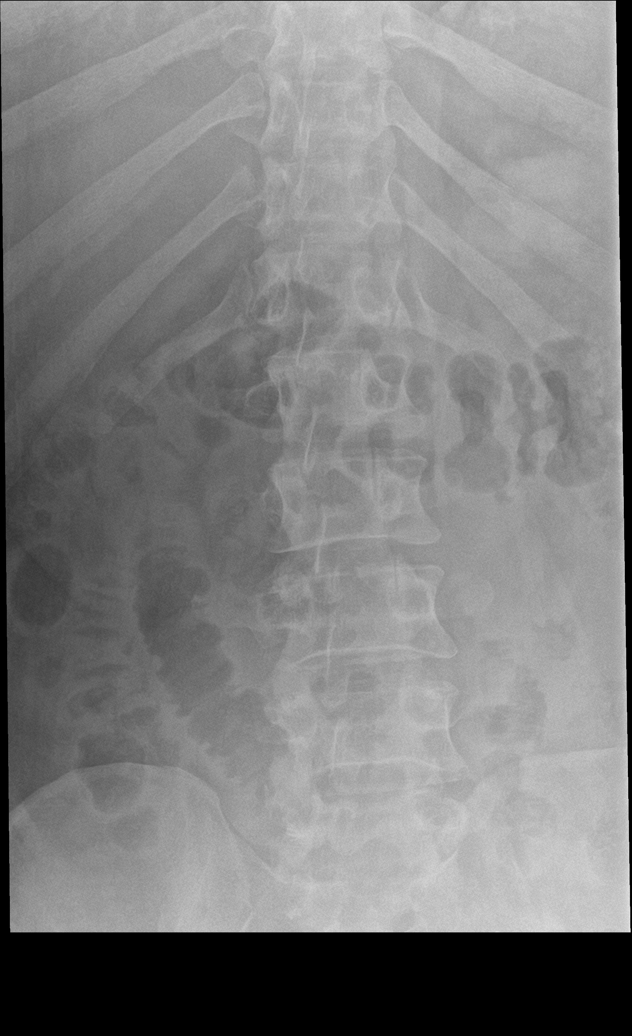

[l-spine lat]
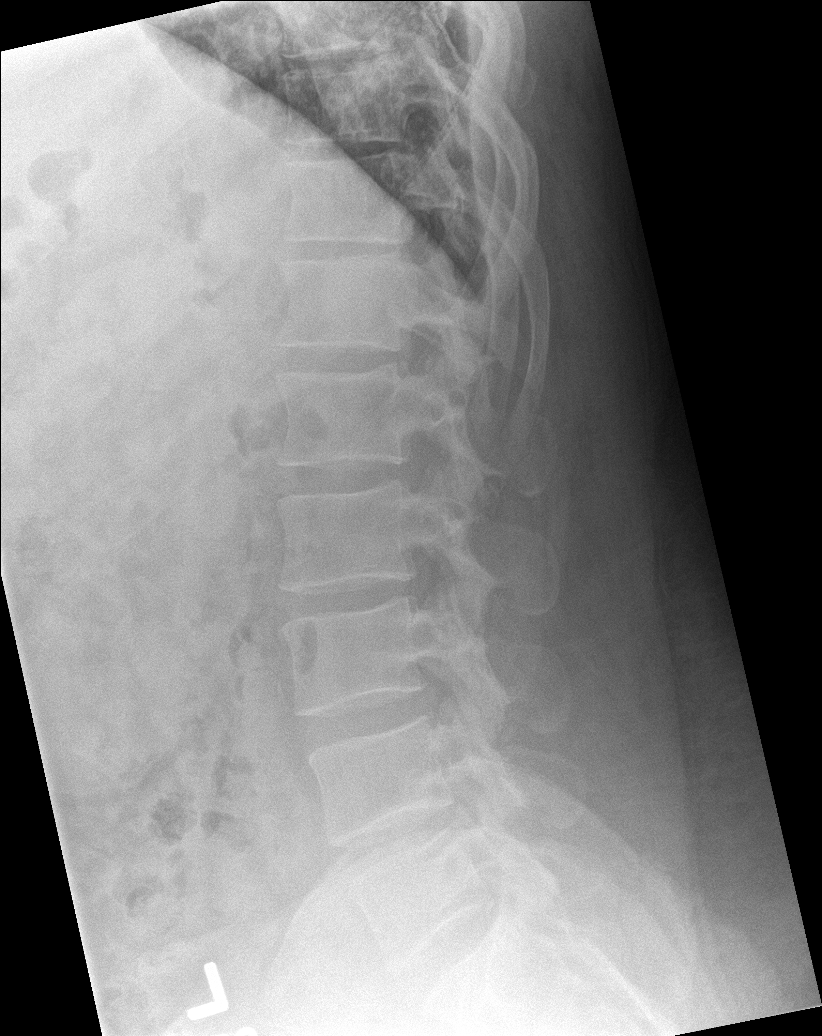

[5 of 5 positions shown; findings below may reference images not displayed]

FINDINGS: There is no evidence of lumbar spine fracture. Alignment is normal.
Intervertebral disc spaces are maintained.
IMPRESSION: Negative.

## 2017-08-01 ENCOUNTER — Other Ambulatory Visit: Payer: Self-pay

## 2017-08-01 ENCOUNTER — Encounter (HOSPITAL_BASED_OUTPATIENT_CLINIC_OR_DEPARTMENT_OTHER): Payer: Self-pay | Admitting: *Deleted

## 2017-08-01 NOTE — Progress Notes (Signed)
Dr. Gifford Shave reviewed history and previous labs - 07/20/2017, K+ 3.1,Ca+ 8.5. Dr Gifford Shave would like I stat 8 day of surgery to check K+.

## 2017-08-03 DIAGNOSIS — K4020 Bilateral inguinal hernia, without obstruction or gangrene, not specified as recurrent: Secondary | ICD-10-CM | POA: Diagnosis not present

## 2017-08-03 DIAGNOSIS — I10 Essential (primary) hypertension: Secondary | ICD-10-CM | POA: Diagnosis not present

## 2017-08-09 ENCOUNTER — Ambulatory Visit (HOSPITAL_BASED_OUTPATIENT_CLINIC_OR_DEPARTMENT_OTHER): Payer: Medicare Other | Admitting: Anesthesiology

## 2017-08-09 ENCOUNTER — Encounter (HOSPITAL_BASED_OUTPATIENT_CLINIC_OR_DEPARTMENT_OTHER): Payer: Self-pay

## 2017-08-09 ENCOUNTER — Other Ambulatory Visit: Payer: Self-pay

## 2017-08-09 ENCOUNTER — Encounter (HOSPITAL_COMMUNITY)
Admission: RE | Disposition: A | Discharge status: Home / Self Care | Payer: Self-pay | Source: Ambulatory Visit | Attending: Surgery

## 2017-08-09 ENCOUNTER — Observation Stay (HOSPITAL_BASED_OUTPATIENT_CLINIC_OR_DEPARTMENT_OTHER)
Admission: RE | Admit: 2017-08-09 | Discharge: 2017-08-10 | Disposition: A | Discharge status: Home / Self Care | Payer: Medicare Other | Source: Ambulatory Visit | Attending: Surgery | Admitting: Surgery

## 2017-08-09 DIAGNOSIS — Z6836 Body mass index (BMI) 36.0-36.9, adult: Secondary | ICD-10-CM | POA: Diagnosis not present

## 2017-08-09 DIAGNOSIS — K4020 Bilateral inguinal hernia, without obstruction or gangrene, not specified as recurrent: Principal | ICD-10-CM | POA: Insufficient documentation

## 2017-08-09 DIAGNOSIS — Z23 Encounter for immunization: Secondary | ICD-10-CM | POA: Insufficient documentation

## 2017-08-09 DIAGNOSIS — G4733 Obstructive sleep apnea (adult) (pediatric): Secondary | ICD-10-CM | POA: Diagnosis not present

## 2017-08-09 DIAGNOSIS — K219 Gastro-esophageal reflux disease without esophagitis: Secondary | ICD-10-CM | POA: Diagnosis not present

## 2017-08-09 DIAGNOSIS — Z885 Allergy status to narcotic agent status: Secondary | ICD-10-CM | POA: Diagnosis not present

## 2017-08-09 DIAGNOSIS — F419 Anxiety disorder, unspecified: Secondary | ICD-10-CM | POA: Insufficient documentation

## 2017-08-09 DIAGNOSIS — F329 Major depressive disorder, single episode, unspecified: Secondary | ICD-10-CM | POA: Diagnosis not present

## 2017-08-09 DIAGNOSIS — I10 Essential (primary) hypertension: Secondary | ICD-10-CM | POA: Insufficient documentation

## 2017-08-09 DIAGNOSIS — F17210 Nicotine dependence, cigarettes, uncomplicated: Secondary | ICD-10-CM | POA: Insufficient documentation

## 2017-08-09 DIAGNOSIS — Z888 Allergy status to other drugs, medicaments and biological substances status: Secondary | ICD-10-CM | POA: Insufficient documentation

## 2017-08-09 DIAGNOSIS — Z8711 Personal history of peptic ulcer disease: Secondary | ICD-10-CM | POA: Diagnosis not present

## 2017-08-09 DIAGNOSIS — Z9989 Dependence on other enabling machines and devices: Secondary | ICD-10-CM | POA: Insufficient documentation

## 2017-08-09 DIAGNOSIS — G8918 Other acute postprocedural pain: Secondary | ICD-10-CM | POA: Diagnosis not present

## 2017-08-09 HISTORY — PX: INSERTION OF MESH: SHX5868

## 2017-08-09 HISTORY — DX: Obstructive sleep apnea (adult) (pediatric): G47.33

## 2017-08-09 HISTORY — PX: INGUINAL HERNIA REPAIR: SUR1180

## 2017-08-09 HISTORY — DX: Depression, unspecified: F32.A

## 2017-08-09 HISTORY — DX: Migraine, unspecified, not intractable, without status migrainosus: G43.909

## 2017-08-09 HISTORY — DX: Major depressive disorder, single episode, unspecified: F32.9

## 2017-08-09 HISTORY — DX: Low back pain: M54.5

## 2017-08-09 HISTORY — DX: Chronic or unspecified gastric ulcer with hemorrhage: K25.4

## 2017-08-09 HISTORY — DX: Other chronic pain: G89.29

## 2017-08-09 HISTORY — DX: Anxiety disorder, unspecified: F41.9

## 2017-08-09 HISTORY — DX: Unspecified chronic bronchitis: J42

## 2017-08-09 HISTORY — DX: Low back pain, unspecified: M54.50

## 2017-08-09 HISTORY — PX: INGUINAL HERNIA REPAIR: SHX194

## 2017-08-09 HISTORY — DX: Personal history of urinary calculi: Z87.442

## 2017-08-09 LAB — CBC
HCT: 49.4 % (ref 39.0–52.0)
Hemoglobin: 16.7 g/dL (ref 13.0–17.0)
MCH: 31 pg (ref 26.0–34.0)
MCHC: 33.8 g/dL (ref 30.0–36.0)
MCV: 91.7 fL (ref 78.0–100.0)
PLATELETS: 249 10*3/uL (ref 150–400)
RBC: 5.39 MIL/uL (ref 4.22–5.81)
RDW: 12.7 % (ref 11.5–15.5)
WBC: 18.9 10*3/uL — ABNORMAL HIGH (ref 4.0–10.5)

## 2017-08-09 LAB — POCT I-STAT, CHEM 8
CREATININE: 0.5 mg/dL — AB (ref 0.61–1.24)
Calcium, Ion: 1.08 mmol/L — ABNORMAL LOW (ref 1.15–1.40)
Chloride: 105 mmol/L (ref 98–111)
GLUCOSE: 109 mg/dL — AB (ref 70–99)
HCT: 48 % (ref 39.0–52.0)
Hemoglobin: 16.3 g/dL (ref 13.0–17.0)
Potassium: 3.8 mmol/L (ref 3.5–5.1)
Sodium: 140 mmol/L (ref 135–145)
TCO2: 23 mmol/L (ref 22–32)

## 2017-08-09 LAB — CREATININE, SERUM: Creatinine, Ser: 0.81 mg/dL (ref 0.61–1.24)

## 2017-08-09 SURGERY — REPAIR, HERNIA, INGUINAL, BILATERAL, ADULT
Anesthesia: General | Site: Abdomen | Laterality: Bilateral

## 2017-08-09 MED ORDER — CELECOXIB 200 MG PO CAPS
200.0000 mg | ORAL_CAPSULE | Freq: Two times a day (BID) | ORAL | Status: DC
  Administered 2017-08-09 – 2017-08-10 (×3): 200 mg via ORAL
  Filled 2017-08-09 (×3): qty 1

## 2017-08-09 MED ORDER — LIDOCAINE HCL (CARDIAC) PF 100 MG/5ML IV SOSY
PREFILLED_SYRINGE | INTRAVENOUS | Status: DC | PRN
  Administered 2017-08-09: 80 mg via INTRAVENOUS

## 2017-08-09 MED ORDER — DEXAMETHASONE SODIUM PHOSPHATE 4 MG/ML IJ SOLN
INTRAMUSCULAR | Status: DC | PRN
  Administered 2017-08-09: 10 mg via INTRAVENOUS

## 2017-08-09 MED ORDER — PROPOFOL 10 MG/ML IV BOLUS
INTRAVENOUS | Status: DC | PRN
  Administered 2017-08-09: 250 mg via INTRAVENOUS

## 2017-08-09 MED ORDER — GABAPENTIN 300 MG PO CAPS
300.0000 mg | ORAL_CAPSULE | Freq: Two times a day (BID) | ORAL | Status: DC
  Administered 2017-08-09 – 2017-08-10 (×3): 300 mg via ORAL
  Filled 2017-08-09 (×3): qty 1

## 2017-08-09 MED ORDER — OXYCODONE HCL 5 MG PO TABS
5.0000 mg | ORAL_TABLET | ORAL | Status: DC | PRN
  Administered 2017-08-09 – 2017-08-10 (×4): 10 mg via ORAL
  Filled 2017-08-09 (×4): qty 2

## 2017-08-09 MED ORDER — MORPHINE SULFATE (PF) 2 MG/ML IV SOLN
2.0000 mg | INTRAVENOUS | Status: DC | PRN
  Administered 2017-08-09: 4 mg via INTRAVENOUS
  Filled 2017-08-09: qty 2

## 2017-08-09 MED ORDER — FENTANYL CITRATE (PF) 100 MCG/2ML IJ SOLN
INTRAMUSCULAR | Status: AC
  Filled 2017-08-09: qty 2

## 2017-08-09 MED ORDER — ACETAMINOPHEN 500 MG PO TABS
ORAL_TABLET | ORAL | Status: AC
  Filled 2017-08-09: qty 2

## 2017-08-09 MED ORDER — BUPIVACAINE-EPINEPHRINE 0.25% -1:200000 IJ SOLN
INTRAMUSCULAR | Status: DC | PRN
  Administered 2017-08-09: 20 mL

## 2017-08-09 MED ORDER — CHLORHEXIDINE GLUCONATE CLOTH 2 % EX PADS: 6.0000 | MEDICATED_PAD | Freq: Once | CUTANEOUS | Status: DC

## 2017-08-09 MED ORDER — DEXAMETHASONE SODIUM PHOSPHATE 10 MG/ML IJ SOLN
INTRAMUSCULAR | Status: AC
  Filled 2017-08-09: qty 1

## 2017-08-09 MED ORDER — FENTANYL CITRATE (PF) 100 MCG/2ML IJ SOLN
INTRAMUSCULAR | Status: AC
Start: 2017-08-09 — End: ?
  Filled 2017-08-09: qty 2

## 2017-08-09 MED ORDER — GABAPENTIN 300 MG PO CAPS
ORAL_CAPSULE | ORAL | Status: AC
  Filled 2017-08-09: qty 1

## 2017-08-09 MED ORDER — BUPIVACAINE-EPINEPHRINE (PF) 0.25% -1:200000 IJ SOLN
INTRAMUSCULAR | Status: DC | PRN
  Administered 2017-08-09 (×2): 30 mL

## 2017-08-09 MED ORDER — GABAPENTIN 300 MG PO CAPS
300.0000 mg | ORAL_CAPSULE | ORAL | Status: AC
  Administered 2017-08-09: 300 mg via ORAL

## 2017-08-09 MED ORDER — ONDANSETRON 4 MG PO TBDP: 4.0000 mg | ORAL_TABLET | Freq: Four times a day (QID) | ORAL | Status: DC | PRN

## 2017-08-09 MED ORDER — DIPHENHYDRAMINE HCL 50 MG/ML IJ SOLN: 25.0000 mg | Freq: Four times a day (QID) | INTRAMUSCULAR | Status: DC | PRN

## 2017-08-09 MED ORDER — SUCCINYLCHOLINE CHLORIDE 20 MG/ML IJ SOLN
INTRAMUSCULAR | Status: DC | PRN
  Administered 2017-08-09: 160 mg via INTRAVENOUS

## 2017-08-09 MED ORDER — CELECOXIB 200 MG PO CAPS: 200.0000 mg | ORAL_CAPSULE | ORAL | Status: DC

## 2017-08-09 MED ORDER — SUGAMMADEX SODIUM 200 MG/2ML IV SOLN
INTRAVENOUS | Status: AC
  Filled 2017-08-09: qty 2

## 2017-08-09 MED ORDER — PNEUMOCOCCAL VAC POLYVALENT 25 MCG/0.5ML IJ INJ
0.5000 mL | INJECTION | INTRAMUSCULAR | Status: AC
  Administered 2017-08-10: 0.5 mL via INTRAMUSCULAR
  Filled 2017-08-09: qty 0.5

## 2017-08-09 MED ORDER — ONDANSETRON HCL 4 MG/2ML IJ SOLN
INTRAMUSCULAR | Status: AC
  Filled 2017-08-09: qty 2

## 2017-08-09 MED ORDER — ONDANSETRON HCL 4 MG/2ML IJ SOLN
INTRAMUSCULAR | Status: DC | PRN
  Administered 2017-08-09: 4 mg via INTRAVENOUS

## 2017-08-09 MED ORDER — ROCURONIUM BROMIDE 100 MG/10ML IV SOLN
INTRAVENOUS | Status: DC | PRN
  Administered 2017-08-09 (×2): 10 mg via INTRAVENOUS
  Administered 2017-08-09: 20 mg via INTRAVENOUS
  Administered 2017-08-09: 10 mg via INTRAVENOUS
  Administered 2017-08-09: 40 mg via INTRAVENOUS
  Administered 2017-08-09: 10 mg via INTRAVENOUS

## 2017-08-09 MED ORDER — CEFAZOLIN SODIUM-DEXTROSE 2-4 GM/100ML-% IV SOLN
INTRAVENOUS | Status: AC
  Filled 2017-08-09: qty 100

## 2017-08-09 MED ORDER — DOCUSATE SODIUM 100 MG PO CAPS
100.0000 mg | ORAL_CAPSULE | Freq: Two times a day (BID) | ORAL | Status: DC
  Administered 2017-08-09 – 2017-08-10 (×2): 100 mg via ORAL
  Filled 2017-08-09 (×2): qty 1

## 2017-08-09 MED ORDER — ENOXAPARIN SODIUM 40 MG/0.4ML ~~LOC~~ SOLN
40.0000 mg | SUBCUTANEOUS | Status: DC
  Administered 2017-08-10: 40 mg via SUBCUTANEOUS
  Filled 2017-08-09: qty 0.4

## 2017-08-09 MED ORDER — CARVEDILOL 6.25 MG PO TABS
6.2500 mg | ORAL_TABLET | Freq: Two times a day (BID) | ORAL | Status: DC
  Administered 2017-08-09 – 2017-08-10 (×2): 6.25 mg via ORAL
  Filled 2017-08-09 (×2): qty 1

## 2017-08-09 MED ORDER — CEFAZOLIN SODIUM-DEXTROSE 1-4 GM/50ML-% IV SOLN
INTRAVENOUS | Status: AC
  Filled 2017-08-09: qty 50

## 2017-08-09 MED ORDER — MIDAZOLAM HCL 2 MG/2ML IJ SOLN
1.0000 mg | INTRAMUSCULAR | Status: DC | PRN
  Administered 2017-08-09: 2 mg via INTRAVENOUS

## 2017-08-09 MED ORDER — ACETAMINOPHEN 500 MG PO TABS
1000.0000 mg | ORAL_TABLET | ORAL | Status: AC
  Administered 2017-08-09: 1000 mg via ORAL

## 2017-08-09 MED ORDER — SCOPOLAMINE 1 MG/3DAYS TD PT72: 1.0000 | MEDICATED_PATCH | Freq: Once | TRANSDERMAL | Status: DC | PRN

## 2017-08-09 MED ORDER — DIPHENHYDRAMINE HCL 25 MG PO CAPS: 25.0000 mg | ORAL_CAPSULE | Freq: Four times a day (QID) | ORAL | Status: DC | PRN

## 2017-08-09 MED ORDER — ROCURONIUM BROMIDE 10 MG/ML (PF) SYRINGE
PREFILLED_SYRINGE | INTRAVENOUS | Status: AC
  Filled 2017-08-09: qty 10

## 2017-08-09 MED ORDER — ONDANSETRON HCL 4 MG/2ML IJ SOLN: 4.0000 mg | Freq: Four times a day (QID) | INTRAMUSCULAR | Status: DC | PRN

## 2017-08-09 MED ORDER — FENTANYL CITRATE (PF) 100 MCG/2ML IJ SOLN
50.0000 ug | INTRAMUSCULAR | Status: AC | PRN
  Administered 2017-08-09: 100 ug via INTRAVENOUS
  Administered 2017-08-09 (×2): 50 ug via INTRAVENOUS

## 2017-08-09 MED ORDER — DEXTROSE 5 % IV SOLN
3.0000 g | INTRAVENOUS | Status: AC
  Administered 2017-08-09: 3 g via INTRAVENOUS

## 2017-08-09 MED ORDER — SODIUM CHLORIDE 0.9 % IV SOLN
INTRAVENOUS | Status: DC
  Administered 2017-08-09: 16:00:00 via INTRAVENOUS

## 2017-08-09 MED ORDER — PROMETHAZINE HCL 25 MG/ML IJ SOLN: 6.2500 mg | INTRAMUSCULAR | Status: DC | PRN

## 2017-08-09 MED ORDER — MOMETASONE FURO-FORMOTEROL FUM 200-5 MCG/ACT IN AERO
2.0000 | INHALATION_SPRAY | Freq: Two times a day (BID) | RESPIRATORY_TRACT | Status: DC
  Administered 2017-08-09 – 2017-08-10 (×2): 2 via RESPIRATORY_TRACT
  Filled 2017-08-09: qty 8.8

## 2017-08-09 MED ORDER — SUCCINYLCHOLINE CHLORIDE 200 MG/10ML IV SOSY
PREFILLED_SYRINGE | INTRAVENOUS | Status: AC
  Filled 2017-08-09: qty 10

## 2017-08-09 MED ORDER — SUGAMMADEX SODIUM 200 MG/2ML IV SOLN
INTRAVENOUS | Status: DC | PRN
  Administered 2017-08-09: 200 mg via INTRAVENOUS

## 2017-08-09 MED ORDER — LACTATED RINGERS IV SOLN
INTRAVENOUS | Status: DC
  Administered 2017-08-09 (×2): via INTRAVENOUS

## 2017-08-09 MED ORDER — FENTANYL CITRATE (PF) 100 MCG/2ML IJ SOLN
25.0000 ug | INTRAMUSCULAR | Status: DC | PRN
  Administered 2017-08-09 (×3): 50 ug via INTRAVENOUS

## 2017-08-09 MED ORDER — ENSURE ENLIVE PO LIQD
237.0000 mL | Freq: Two times a day (BID) | ORAL | Status: DC
  Administered 2017-08-09 – 2017-08-10 (×2): 237 mL via ORAL

## 2017-08-09 MED ORDER — BUPIVACAINE-EPINEPHRINE (PF) 0.25% -1:200000 IJ SOLN
INTRAMUSCULAR | Status: AC
  Filled 2017-08-09: qty 30

## 2017-08-09 MED ORDER — HYDROMORPHONE HCL 1 MG/ML IJ SOLN
1.0000 mg | INTRAMUSCULAR | Status: DC | PRN
  Administered 2017-08-10: 1 mg via INTRAVENOUS
  Filled 2017-08-09: qty 1

## 2017-08-09 MED ORDER — METHOCARBAMOL 500 MG PO TABS
500.0000 mg | ORAL_TABLET | Freq: Four times a day (QID) | ORAL | Status: DC | PRN
  Administered 2017-08-09 – 2017-08-10 (×2): 500 mg via ORAL
  Filled 2017-08-09 (×2): qty 1

## 2017-08-09 MED ORDER — MIDAZOLAM HCL 2 MG/2ML IJ SOLN
INTRAMUSCULAR | Status: AC
  Filled 2017-08-09: qty 2

## 2017-08-09 SURGICAL SUPPLY — 53 items
APL SKNCLS STERI-STRIP NONHPOA (GAUZE/BANDAGES/DRESSINGS) ×1
BENZOIN TINCTURE PRP APPL 2/3 (GAUZE/BANDAGES/DRESSINGS) ×3 IMPLANT
BLADE CLIPPER SURG (BLADE) IMPLANT
BLADE HEX COATED 2.75 (ELECTRODE) ×3 IMPLANT
BLADE SURG 15 STRL LF DISP TIS (BLADE) ×1 IMPLANT
BLADE SURG 15 STRL SS (BLADE) ×3
CHLORAPREP W/TINT 26ML (MISCELLANEOUS) ×3 IMPLANT
CLOSURE WOUND 1/2 X4 (GAUZE/BANDAGES/DRESSINGS) ×1
COVER BACK TABLE 60X90IN (DRAPES) ×3 IMPLANT
COVER MAYO STAND STRL (DRAPES) ×3 IMPLANT
DECANTER SPIKE VIAL GLASS SM (MISCELLANEOUS) ×3 IMPLANT
DRAIN PENROSE 1/2X12 LTX STRL (WOUND CARE) ×3 IMPLANT
DRAPE LAPAROTOMY TRNSV 102X78 (DRAPE) ×3 IMPLANT
DRAPE UTILITY XL STRL (DRAPES) ×3 IMPLANT
DRSG TEGADERM 4X4.75 (GAUZE/BANDAGES/DRESSINGS) ×6 IMPLANT
ELECT REM PT RETURN 9FT ADLT (ELECTROSURGICAL) ×3
ELECTRODE REM PT RTRN 9FT ADLT (ELECTROSURGICAL) ×1 IMPLANT
GAUZE SPONGE 4X4 12PLY STRL LF (GAUZE/BANDAGES/DRESSINGS) ×3 IMPLANT
GLOVE BIO SURGEON STRL SZ 6.5 (GLOVE) ×3 IMPLANT
GLOVE BIO SURGEON STRL SZ7 (GLOVE) ×3 IMPLANT
GLOVE BIO SURGEONS STRL SZ 6.5 (GLOVE) ×3
GLOVE BIOGEL PI IND STRL 7.0 (GLOVE) IMPLANT
GLOVE BIOGEL PI IND STRL 7.5 (GLOVE) ×1 IMPLANT
GLOVE BIOGEL PI INDICATOR 7.0 (GLOVE) ×4
GLOVE BIOGEL PI INDICATOR 7.5 (GLOVE) ×2
GOWN STRL REUS W/ TWL LRG LVL3 (GOWN DISPOSABLE) ×2 IMPLANT
GOWN STRL REUS W/TWL LRG LVL3 (GOWN DISPOSABLE) ×6
MESH PARIETEX PROGRIP LEFT (Mesh General) ×2 IMPLANT
MESH PARIETEX PROGRIP RIGHT (Mesh General) ×2 IMPLANT
NDL HYPO 25X1 1.5 SAFETY (NEEDLE) ×1 IMPLANT
NEEDLE HYPO 25X1 1.5 SAFETY (NEEDLE) ×3 IMPLANT
NS IRRIG 1000ML POUR BTL (IV SOLUTION) IMPLANT
PACK BASIN DAY SURGERY FS (CUSTOM PROCEDURE TRAY) ×3 IMPLANT
PENCIL BUTTON HOLSTER BLD 10FT (ELECTRODE) ×3 IMPLANT
SLEEVE SCD COMPRESS KNEE MED (MISCELLANEOUS) ×3 IMPLANT
SPONGE INTESTINAL PEANUT (DISPOSABLE) ×3 IMPLANT
SPONGE LAP 4X18 RFD (DISPOSABLE) IMPLANT
STRIP CLOSURE SKIN 1/2X4 (GAUZE/BANDAGES/DRESSINGS) ×2 IMPLANT
SUT MON AB 4-0 PC3 18 (SUTURE) ×6 IMPLANT
SUT PDS AB 0 CT 36 (SUTURE) IMPLANT
SUT SILK 2 0 SH (SUTURE) IMPLANT
SUT SILK 3 0 SH 30 (SUTURE) IMPLANT
SUT SILK 3 0 TIES 17X18 (SUTURE) ×3
SUT SILK 3-0 18XBRD TIE BLK (SUTURE) IMPLANT
SUT VIC AB 0 CT1 27 (SUTURE) ×3
SUT VIC AB 0 CT1 27XBRD ANBCTR (SUTURE) ×1 IMPLANT
SUT VIC AB 0 SH 27 (SUTURE) IMPLANT
SUT VIC AB 2-0 SH 27 (SUTURE) ×6
SUT VIC AB 2-0 SH 27XBRD (SUTURE) ×2 IMPLANT
SUT VIC AB 3-0 SH 27 (SUTURE) ×6
SUT VIC AB 3-0 SH 27X BRD (SUTURE) ×2 IMPLANT
SYR CONTROL 10ML LL (SYRINGE) ×3 IMPLANT
TOWEL GREEN STERILE FF (TOWEL DISPOSABLE) ×3 IMPLANT

## 2017-08-09 NOTE — Anesthesia Postprocedure Evaluation (Signed)
Anesthesia Post Note  Patient: Marc Mayo  Procedure(s) Performed: OPEN HERNIA REPAIR INGUINAL ADULT BILATERAL WITH MESH (Bilateral Abdomen) INSERTION OF MESH (Bilateral Abdomen)     Patient location during evaluation: PACU Anesthesia Type: General Level of consciousness: awake and alert Pain management: pain level controlled Vital Signs Assessment: post-procedure vital signs reviewed and stable Respiratory status: spontaneous breathing, nonlabored ventilation, respiratory function stable and patient connected to nasal cannula oxygen Cardiovascular status: blood pressure returned to baseline and stable Postop Assessment: no apparent nausea or vomiting Anesthetic complications: no    Last Vitals:  Vitals:   08/09/17 1330 08/09/17 1345  BP: 136/88 130/79  Pulse: 74 74  Resp: 17 (!) 22  Temp:    SpO2: 98% 97%    Last Pain:  Vitals:   08/09/17 1315  TempSrc:   PainSc: 9                  Firman Petrow S

## 2017-08-09 NOTE — Anesthesia Procedure Notes (Signed)
Procedure Name: Intubation Date/Time: 08/09/2017 10:02 AM Performed by: Lyndee Leo, CRNA Pre-anesthesia Checklist: Patient identified, Emergency Drugs available, Suction available and Patient being monitored Patient Re-evaluated:Patient Re-evaluated prior to induction Oxygen Delivery Method: Circle system utilized Preoxygenation: Pre-oxygenation with 100% oxygen Induction Type: IV induction Ventilation: Mask ventilation without difficulty and Oral airway inserted - appropriate to patient size Laryngoscope Size: Miller and 3 Grade View: Grade III Tube type: Oral Tube size: 8.0 mm Number of attempts: 1 Airway Equipment and Method: Stylet and Oral airway Placement Confirmation: ETT inserted through vocal cords under direct vision,  positive ETCO2 and breath sounds checked- equal and bilateral Secured at: 22 cm Tube secured with: Tape Dental Injury: Teeth and Oropharynx as per pre-operative assessment

## 2017-08-09 NOTE — Anesthesia Procedure Notes (Addendum)
Anesthesia Procedure Image    

## 2017-08-09 NOTE — Transfer of Care (Signed)
Immediate Anesthesia Transfer of Care Note  Patient: Marc Mayo  Procedure(s) Performed: OPEN HERNIA REPAIR INGUINAL ADULT BILATERAL WITH MESH (Bilateral Abdomen) INSERTION OF MESH (Bilateral Abdomen)  Patient Location: PACU  Anesthesia Type:GA combined with regional for post-op pain  Level of Consciousness: awake, sedated and patient cooperative  Airway & Oxygen Therapy: Patient Spontanous Breathing and Patient connected to face mask oxygen  Post-op Assessment: Report given to RN and Post -op Vital signs reviewed and stable  Post vital signs: Reviewed and stable  Last Vitals:  Vitals Value Taken Time  BP 97/77 08/09/2017 12:30 PM  Temp 36.6 C 08/09/2017 12:27 PM  Pulse 75 08/09/2017 12:33 PM  Resp 28 08/09/2017 12:33 PM  SpO2 92 % 08/09/2017 12:33 PM  Vitals shown include unvalidated device data.  Last Pain:  Vitals:   08/09/17 0755  TempSrc: Oral  PainSc: 10-Worst pain ever      Patients Stated Pain Goal: 0 (01/25/30 3557)  Complications: No apparent anesthesia complications

## 2017-08-09 NOTE — Anesthesia Procedure Notes (Addendum)
Anesthesia Regional Block: TAP block   Pre-Anesthetic Checklist: ,, timeout performed, Correct Patient, Correct Site, Correct Laterality, Correct Procedure, Correct Position, site marked, Risks and benefits discussed,  Surgical consent,  Pre-op evaluation,  At surgeon's request and post-op pain management  Laterality: Left and Right  Prep: chloraprep       Needles:  Injection technique: Single-shot  Needle Type: Echogenic Needle     Needle Length: 9cm      Additional Needles:   Procedures:,,,, ultrasound used (permanent image in chart),,,,  Narrative:  Start time: 08/09/2017 9:25 AM End time: 08/09/2017 9:38 AM Injection made incrementally with aspirations every 5 mL.  Performed by: Personally  Anesthesiologist: Myrtie Soman, MD  Additional Notes: Patient tolerated the procedure well without complications

## 2017-08-09 NOTE — Op Note (Signed)
Hernia, Open, Procedure Note  Indications: The patient presented with a history of right groin pain.  CT scan showed bilateral, reducible inguinal hernias containing only fat.  The patient's body habitus prevents palpation of the hernias.  He presents now for surgical repair..    Pre-operative Diagnosis: bilateral reducible inguinal hernia Post-operative Diagnosis: same  Surgeon: Maia Petties   Assistants: Carlena Hurl, PA-C  Anesthesia: General endotracheal anesthesia and TAP block  ASA Class: 2  Procedure Details  The patient was seen again in the Holding Room. The risks, benefits, complications, treatment options, and expected outcomes were discussed with the patient. The possibilities of reaction to medication, pulmonary aspiration, perforation of viscus, bleeding, recurrent infection, the need for additional procedures, and development of a complication requiring transfusion or further operation were discussed with the patient and/or family. The likelihood of success in repairing the hernia and returning the patient to their previous functional status is good.  There was concurrence with the proposed plan, and informed consent was obtained. The site of surgery was properly noted/marked. The patient was taken to the Operating Room, identified as Marc Mayo, and the procedure verified as bilateral inguinal hernia repair. A Time Out was held and the above information confirmed.  The patient was placed in the supine position and underwent induction of anesthesia. The lower abdomen and groin was prepped with Chloraprep and draped in the standard fashion, and 0.25% Marcaine with epinephrine was used to anesthetize the skin over the mid-portion of the right inguinal canal. An oblique incision was made. Dissection was carried down through the large amount of subcutaneous tissue with cautery to the external oblique fascia.  A Balfour retractor was used for exposure.  We opened the external  oblique fascia along the direction of its fibers to the external ring.  The spermatic cord was circumferentially dissected bluntly and retracted with a Penrose drain.  The ilioinguinal nerve was identified and preserved.  The floor of the inguinal canal was inspected and was intact.  We skeletonized the spermatic cord and reduced a large indirect hernia sac containing fat.  The internal ring was tightened with 0 Vicryl  We used a right Progrip mesh which was inserted and deployed across the floor of the inguinal canal. The mesh was tucked underneath the external oblique fascia laterally.  The flap of the mesh was closed around the spermatic cord to recreate the internal inguinal ring.  The mesh was secured to the pubic tubercle with 0 Vicryl.  Additional stay sutures of 0 vicryl were placed at the lower edge of the mesh and to close the mesh flap. The external oblique fascia was reapproximated with 2-0 Vicryl.  3-0 Vicryl was used to close the subcutaneous tissues and 4-0 Monocryl was used to close the skin in subcuticular fashion.    We then turned our attention to the left side.  0.25% Marcaine with epinephrine was used to anesthetize the skin over the mid-portion of the inguinal canal. An oblique incision was made. Dissection was carried down through the subcutaneous tissue with cautery to the external oblique fascia.  We opened the external oblique fascia along the direction of its fibers to the external ring.  The spermatic cord was circumferentially dissected bluntly and retracted with a Penrose drain.  The ilioinguinal nerve was identified and preserved.  The floor of the inguinal canal was inspected and was intact.  We skeletonized the spermatic cord and reduced a large indirect hernia sac.  We used a left Progrip  mesh which was inserted and deployed across the floor of the inguinal canal. The mesh was tucked underneath the external oblique fascia laterally.  The flap of the mesh was closed around the  spermatic cord to recreate the internal inguinal ring.  The mesh was secured to the pubic tubercle with 0 Vicryl.   Additional stay sutures of 0 vicryl were placed at the lower edge of the mesh and to close the mesh flap.The external oblique fascia was reapproximated with 2-0 Vicryl.  3-0 Vicryl was used to close the subcutaneous tissues and 4-0 Monocryl was used to close the skin in subcuticular fashion. Benzoin and steri-strips were used to seal the incision.  A clean dressing was applied.  The patient was then extubated and brought to the recovery room in stable condition.  All sponge, instrument, and needle counts were correct prior to closure and at the conclusion of the case.   Estimated Blood Loss: Minimal                 Complications: None; patient tolerated the procedure well.         Disposition: PACU - hemodynamically stable.         Condition: stable  Imogene Burn. Georgette Dover, MD, [facility]  General/ Trauma Surgery  08/09/2017 12:17 PM

## 2017-08-09 NOTE — Discharge Instructions (Signed)
[facility] _______[facility] ° °UMBILICAL OR INGUINAL HERNIA REPAIR: POST OP INSTRUCTIONS ° °Always review your discharge instruction sheet given to you by the facility where your surgery was performed. °IF YOU HAVE DISABILITY OR FAMILY LEAVE FORMS, YOU MUST BRING THEM TO THE OFFICE FOR PROCESSING.   °DO NOT GIVE THEM TO YOUR DOCTOR. ° °1. A  prescription for pain medication may be given to you upon discharge.  Take your pain medication as prescribed, if needed.  If narcotic pain medicine is not needed, then you may take acetaminophen (Tylenol) or ibuprofen (Advil) as needed. °2. Take your usually prescribed medications unless otherwise directed. °If you need a refill on your pain medication, please contact your pharmacy.  They will contact our office to request authorization. Prescriptions will not be filled after 5 pm or on week-ends. °3. You should follow a light diet the first 24 hours after arrival home, such as soup and crackers, etc.  Be sure to include lots of fluids daily.  Resume your normal diet the day after surgery. °4.Most patients will experience some swelling and bruising around the umbilicus or in the groin and scrotum.  Ice packs and reclining will help.  Swelling and bruising can take several days to resolve.  °6. It is common to experience some constipation if taking pain medication after surgery.  Increasing fluid intake and taking a stool softener (such as Colace) will usually help or prevent this problem from occurring.  A mild laxative (Milk of Magnesia or Miralax) should be taken according to package directions if there are no bowel movements after 48 hours. °7. Unless discharge instructions indicate otherwise, you may remove your bandages 24-48 hours after surgery, and you may shower at that time.  You may have steri-strips (small skin tapes) in place directly over the incision.  These strips should be left on the skin for 7-10 days.  If your surgeon used skin glue on the  incision, you may shower in 24 hours.  The glue will flake off over the next 2-3 weeks.  Any sutures or staples will be removed at the office during your follow-up visit. °8. ACTIVITIES:  You may resume regular (light) daily activities beginning the next day--such as daily self-care, walking, climbing stairs--gradually increasing activities as tolerated.  You may have sexual intercourse when it is comfortable.  Refrain from any heavy lifting or straining until approved by your doctor. ° °a.You may drive when you are no longer taking prescription pain medication, you can comfortably wear a seatbelt, and you can safely maneuver your car and apply brakes. °b.RETURN TO WORK:   °_____________________________________________ ° °9.You should see your doctor in the office for a follow-up appointment approximately 2-3 weeks after your surgery.  Make sure that you call for this appointment within a day or two after you arrive home to insure a convenient appointment time. °10.OTHER INSTRUCTIONS: _________________________ °   _____________________________________ ° °WHEN TO CALL YOUR DOCTOR: °1. Fever over 101.0 °2. Inability to urinate °3. Nausea and/or vomiting °4. Extreme swelling or bruising °5. Continued bleeding from incision. °6. Increased pain, redness, or drainage from the incision ° °The clinic staff is available to answer your questions during regular business hours.  Please don’t hesitate to call and ask to speak to one of the nurses for clinical concerns.  If you have a medical emergency, go to the nearest emergency room or call 911.  A surgeon from [facility] is always on call at the hospital ° ° °  [address], [address], [place], [place]  [address] ? ° [address] [address], [place], [place]   [address] °[phone] ? [phone] ? FAX [phone] °Web site: [email or web address] °

## 2017-08-09 NOTE — Progress Notes (Signed)
Assisted Dr. Kalman Shan with right, left, ultrasound guided, transabdominal plane block. Side rails up, monitors on throughout procedure. See vital signs in flow sheet. Tolerated Procedure well.

## 2017-08-09 NOTE — Interval H&P Note (Signed)
History and Physical Interval Note:  08/09/2017 9:29 AM  Marc Mayo  has presented today for surgery, with the diagnosis of BILATERAL INGUINAL HERNIA  The various methods of treatment have been discussed with the patient and family. After consideration of risks, benefits and other options for treatment, the patient has consented to  Procedure(s) with comments: Carey (Bilateral) - GENERAL AND TAP BLOCK INSERTION OF MESH (Bilateral) - GENERAL AND TAP BLOCK as a surgical intervention .  The patient's history has been reviewed, patient examined, no change in status, stable for surgery.  I have reviewed the patient's chart and labs.  Questions were answered to the patient's satisfaction.     Maia Petties

## 2017-08-09 NOTE — Anesthesia Procedure Notes (Signed)
Anesthesia Procedure Image    

## 2017-08-09 NOTE — Anesthesia Preprocedure Evaluation (Signed)
Anesthesia Evaluation  Patient identified by MRN, date of birth, ID band Patient awake    Reviewed: Allergy & Precautions, NPO status , Patient's Chart, lab work & pertinent test results  Airway Mallampati: II  TM Distance: >3 FB Neck ROM: Full    Dental no notable dental hx.    Pulmonary sleep apnea , Current Smoker,    Pulmonary exam normal breath sounds clear to auscultation       Cardiovascular hypertension, Normal cardiovascular exam Rhythm:Regular Rate:Normal     Neuro/Psych negative neurological ROS  negative psych ROS   GI/Hepatic negative GI ROS, Neg liver ROS,   Endo/Other  negative endocrine ROS  Renal/GU negative Renal ROS  negative genitourinary   Musculoskeletal negative musculoskeletal ROS (+)   Abdominal   Peds negative pediatric ROS (+)  Hematology negative hematology ROS (+)   Anesthesia Other Findings   Reproductive/Obstetrics negative OB ROS                             Anesthesia Physical Anesthesia Plan  ASA: II  Anesthesia Plan: General   Post-op Pain Management:  Regional for Post-op pain   Induction: Intravenous  PONV Risk Score and Plan: 2 and Ondansetron, Dexamethasone and Treatment may vary due to age or medical condition  Airway Management Planned: Oral ETT and LMA  Additional Equipment:   Intra-op Plan:   Post-operative Plan: Extubation in OR  Informed Consent: I have reviewed the patients History and Physical, chart, labs and discussed the procedure including the risks, benefits and alternatives for the proposed anesthesia with the patient or authorized representative who has indicated his/her understanding and acceptance.   Dental advisory given  Plan Discussed with: CRNA and Surgeon  Anesthesia Plan Comments:         Anesthesia Quick Evaluation

## 2017-08-10 ENCOUNTER — Encounter (HOSPITAL_BASED_OUTPATIENT_CLINIC_OR_DEPARTMENT_OTHER): Payer: Self-pay | Admitting: Surgery

## 2017-08-10 DIAGNOSIS — G4733 Obstructive sleep apnea (adult) (pediatric): Secondary | ICD-10-CM | POA: Diagnosis not present

## 2017-08-10 DIAGNOSIS — K4020 Bilateral inguinal hernia, without obstruction or gangrene, not specified as recurrent: Secondary | ICD-10-CM | POA: Diagnosis not present

## 2017-08-10 DIAGNOSIS — I10 Essential (primary) hypertension: Secondary | ICD-10-CM | POA: Diagnosis not present

## 2017-08-10 DIAGNOSIS — F419 Anxiety disorder, unspecified: Secondary | ICD-10-CM | POA: Diagnosis not present

## 2017-08-10 DIAGNOSIS — F329 Major depressive disorder, single episode, unspecified: Secondary | ICD-10-CM | POA: Diagnosis not present

## 2017-08-10 DIAGNOSIS — Z23 Encounter for immunization: Secondary | ICD-10-CM | POA: Diagnosis not present

## 2017-08-10 MED ORDER — OXYCODONE HCL 5 MG PO TABS: 5.0000 mg | ORAL_TABLET | Freq: Four times a day (QID) | ORAL | 0 refills | Status: DC | PRN

## 2017-08-10 NOTE — Discharge Summary (Signed)
Physician Discharge Summary  Patient ID: Marc Mayo MRN: [id number] DOB/AGE: 1985-11-16 32 y.o.  Admit date: 08/09/2017 Discharge date: 08/10/2017  Admission Diagnoses:  Bilateral inguinal hernias    Obstructive sleep apnea on CPAP  Discharge Diagnoses: same Active Problems:   Obstructive sleep apnea on CPAP   Discharged Condition: good  Hospital Course: Bilateral inguinal hernia repairs with mesh 08/09/17 at [facility].  Patient did not have his own CPAP machine, so he was transferred to [facility] for overnight observation on CPAP.  He did well.  Pain moderately well controlled.  Tolerating diet.  Ready for discharge.    Treatments: surgery: bilateral inguinal hernia repairs with mesh  Discharge Exam: Blood pressure 132/82, pulse 95, temperature 98.8 F (37.1 C), temperature source Oral, resp. rate 19, height 6\' 3"  (1.905 m), weight 130.9 kg (288 lb 8 oz), SpO2 98 %. Bilateral inguinal incisions - minimal dried drainage on dressing; minimal swelling  Disposition: Discharge disposition: 01-Home or Self Care       Discharge Instructions    Call MD for:  persistant nausea and vomiting   Complete by:  As directed    Call MD for:  redness, tenderness, or signs of infection (pain, swelling, redness, odor or green/yellow discharge around incision site)   Complete by:  As directed    Call MD for:  severe uncontrolled pain   Complete by:  As directed    Call MD for:  temperature >100.4   Complete by:  As directed    Diet general   Complete by:  As directed    Driving Restrictions   Complete by:  As directed    Do not drive while taking pain medications   Increase activity slowly   Complete by:  As directed    May shower / Bathe   Complete by:  As directed       Follow-up Information    Donnie Mesa, MD In 3 weeks.   Specialty:  General Surgery Contact information: [address] [place] [address] [phone]            Signed: Maia Petties 08/10/2017, 9:11 AM

## 2017-08-10 NOTE — Care Management Note (Signed)
Case Management Note CM cross coverage for 6N [phone] Marvetta Gibbons RN, CM  Patient Details  Name: Marc Mayo MRN: [id number] Date of Birth: October 22, 1985  Subjective/Objective:   Pt admitted s/p Bilateral inguinal hernia repair                Action/Plan: PTA pt lived at home, noted CPAP ordered for discharge- CM went to room to see pt regarding DME need- however pt had already discharged home- call made to pt at home regarding CPAP- pt reports that he does not have a CPAP- had a sleep study done at [facility] sleep center about 6 years ago. Explained to pt that he would need a new sleep study as that one was to old to use for CPAP at this point. Pt has PCP and will f/u with PCP for new sleep study and CPAP needs.   Expected Discharge Date:  08/10/17               Expected Discharge Plan:  Home/Self Care  In-House Referral:     Discharge planning Services  CM Consult  Post Acute Care Choice:  Durable Medical Equipment Choice offered to:  Patient  DME Arranged:  Continuous positive airway pressure (CPAP) DME Agency:     HH Arranged:    HH Agency:     Status of Service:  Completed, signed off  If discussed at [place] of Stay Meetings, dates discussed:    Additional Comments:  [name], RN 08/10/2017, 2:15 PM

## 2017-08-10 NOTE — Addendum Note (Signed)
Addendum  created 08/10/17 1050 by Tawni Millers, CRNA   Charge Capture section accepted

## 2017-08-11 ENCOUNTER — Encounter (HOSPITAL_BASED_OUTPATIENT_CLINIC_OR_DEPARTMENT_OTHER): Payer: Self-pay | Admitting: Surgery

## 2017-09-04 DIAGNOSIS — R1032 Left lower quadrant pain: Secondary | ICD-10-CM | POA: Diagnosis not present

## 2017-09-04 DIAGNOSIS — F331 Major depressive disorder, recurrent, moderate: Secondary | ICD-10-CM | POA: Diagnosis not present

## 2017-09-19 DIAGNOSIS — F411 Generalized anxiety disorder: Secondary | ICD-10-CM | POA: Diagnosis not present

## 2017-09-19 DIAGNOSIS — F330 Major depressive disorder, recurrent, mild: Secondary | ICD-10-CM | POA: Diagnosis not present

## 2017-09-25 ENCOUNTER — Other Ambulatory Visit: Payer: Self-pay | Admitting: Surgery

## 2017-09-25 DIAGNOSIS — R1903 Right lower quadrant abdominal swelling, mass and lump: Secondary | ICD-10-CM

## 2017-10-03 ENCOUNTER — Ambulatory Visit
Admission: RE | Admit: 2017-10-03 | Discharge: 2017-10-03 | Disposition: A | Discharge status: Home / Self Care | Payer: Medicare Other | Source: Ambulatory Visit | Attending: Surgery | Admitting: Surgery

## 2017-10-03 DIAGNOSIS — R1903 Right lower quadrant abdominal swelling, mass and lump: Secondary | ICD-10-CM

## 2017-10-03 DIAGNOSIS — K760 Fatty (change of) liver, not elsewhere classified: Secondary | ICD-10-CM | POA: Diagnosis not present

## 2017-10-03 MED ORDER — IOPAMIDOL (ISOVUE-300) INJECTION 61%
125.0000 mL | Freq: Once | INTRAVENOUS | Status: AC | PRN
  Administered 2017-10-03: 125 mL via INTRAVENOUS

## 2017-10-04 ENCOUNTER — Other Ambulatory Visit: Payer: Self-pay | Admitting: Surgery

## 2017-10-04 DIAGNOSIS — S301XXA Contusion of abdominal wall, initial encounter: Secondary | ICD-10-CM

## 2017-10-09 DIAGNOSIS — Z79899 Other long term (current) drug therapy: Secondary | ICD-10-CM | POA: Diagnosis not present

## 2017-10-09 DIAGNOSIS — G8911 Acute pain due to trauma: Secondary | ICD-10-CM | POA: Diagnosis not present

## 2017-10-09 DIAGNOSIS — F17210 Nicotine dependence, cigarettes, uncomplicated: Secondary | ICD-10-CM | POA: Diagnosis not present

## 2017-10-09 DIAGNOSIS — K219 Gastro-esophageal reflux disease without esophagitis: Secondary | ICD-10-CM | POA: Diagnosis not present

## 2017-10-09 DIAGNOSIS — Z888 Allergy status to other drugs, medicaments and biological substances status: Secondary | ICD-10-CM | POA: Diagnosis not present

## 2017-10-09 DIAGNOSIS — I10 Essential (primary) hypertension: Secondary | ICD-10-CM | POA: Diagnosis not present

## 2017-10-09 DIAGNOSIS — S60221A Contusion of right hand, initial encounter: Secondary | ICD-10-CM | POA: Diagnosis not present

## 2017-10-09 DIAGNOSIS — S6991XA Unspecified injury of right wrist, hand and finger(s), initial encounter: Secondary | ICD-10-CM | POA: Diagnosis not present

## 2017-10-12 ENCOUNTER — Other Ambulatory Visit: Payer: Self-pay | Admitting: Radiology

## 2017-10-12 DIAGNOSIS — F411 Generalized anxiety disorder: Secondary | ICD-10-CM | POA: Diagnosis not present

## 2017-10-12 DIAGNOSIS — M5441 Lumbago with sciatica, right side: Secondary | ICD-10-CM | POA: Diagnosis not present

## 2017-10-12 DIAGNOSIS — G8929 Other chronic pain: Secondary | ICD-10-CM | POA: Diagnosis not present

## 2017-10-12 DIAGNOSIS — F330 Major depressive disorder, recurrent, mild: Secondary | ICD-10-CM | POA: Diagnosis not present

## 2017-10-13 ENCOUNTER — Ambulatory Visit (HOSPITAL_COMMUNITY)
Admission: RE | Admit: 2017-10-13 | Discharge: 2017-10-13 | Disposition: A | Discharge status: Home / Self Care | Payer: Medicare Other | Source: Ambulatory Visit | Attending: Surgery | Admitting: Surgery

## 2017-10-13 ENCOUNTER — Encounter (HOSPITAL_COMMUNITY): Payer: Self-pay

## 2017-10-13 ENCOUNTER — Other Ambulatory Visit: Payer: Self-pay

## 2017-10-13 DIAGNOSIS — Z8719 Personal history of other diseases of the digestive system: Secondary | ICD-10-CM | POA: Insufficient documentation

## 2017-10-13 DIAGNOSIS — F419 Anxiety disorder, unspecified: Secondary | ICD-10-CM | POA: Insufficient documentation

## 2017-10-13 DIAGNOSIS — I700 Atherosclerosis of aorta: Secondary | ICD-10-CM | POA: Diagnosis not present

## 2017-10-13 DIAGNOSIS — K760 Fatty (change of) liver, not elsewhere classified: Secondary | ICD-10-CM | POA: Insufficient documentation

## 2017-10-13 DIAGNOSIS — L7634 Postprocedural seroma of skin and subcutaneous tissue following other procedure: Secondary | ICD-10-CM | POA: Diagnosis not present

## 2017-10-13 DIAGNOSIS — F909 Attention-deficit hyperactivity disorder, unspecified type: Secondary | ICD-10-CM | POA: Insufficient documentation

## 2017-10-13 DIAGNOSIS — S301XXA Contusion of abdominal wall, initial encounter: Secondary | ICD-10-CM

## 2017-10-13 DIAGNOSIS — G4733 Obstructive sleep apnea (adult) (pediatric): Secondary | ICD-10-CM | POA: Diagnosis not present

## 2017-10-13 DIAGNOSIS — F319 Bipolar disorder, unspecified: Secondary | ICD-10-CM | POA: Insufficient documentation

## 2017-10-13 DIAGNOSIS — Z79899 Other long term (current) drug therapy: Secondary | ICD-10-CM | POA: Diagnosis not present

## 2017-10-13 DIAGNOSIS — K219 Gastro-esophageal reflux disease without esophagitis: Secondary | ICD-10-CM | POA: Insufficient documentation

## 2017-10-13 DIAGNOSIS — M7981 Nontraumatic hematoma of soft tissue: Secondary | ICD-10-CM | POA: Diagnosis not present

## 2017-10-13 DIAGNOSIS — K91873 Postprocedural seroma of a digestive system organ or structure following other procedure: Secondary | ICD-10-CM | POA: Insufficient documentation

## 2017-10-13 DIAGNOSIS — Z87442 Personal history of urinary calculi: Secondary | ICD-10-CM | POA: Diagnosis not present

## 2017-10-13 DIAGNOSIS — K589 Irritable bowel syndrome without diarrhea: Secondary | ICD-10-CM | POA: Insufficient documentation

## 2017-10-13 DIAGNOSIS — F17210 Nicotine dependence, cigarettes, uncomplicated: Secondary | ICD-10-CM | POA: Insufficient documentation

## 2017-10-13 DIAGNOSIS — I10 Essential (primary) hypertension: Secondary | ICD-10-CM | POA: Insufficient documentation

## 2017-10-13 LAB — CBC WITH DIFFERENTIAL/PLATELET
ABS IMMATURE GRANULOCYTES: 0 10*3/uL (ref 0.0–0.1)
Basophils Absolute: 0.1 10*3/uL (ref 0.0–0.1)
Basophils Relative: 1 %
Eosinophils Absolute: 0.5 10*3/uL (ref 0.0–0.7)
Eosinophils Relative: 6 %
HEMATOCRIT: 46.7 % (ref 39.0–52.0)
HEMOGLOBIN: 15.7 g/dL (ref 13.0–17.0)
Immature Granulocytes: 0 %
LYMPHS ABS: 3 10*3/uL (ref 0.7–4.0)
Lymphocytes Relative: 39 %
MCH: 30.7 pg (ref 26.0–34.0)
MCHC: 33.6 g/dL (ref 30.0–36.0)
MCV: 91.2 fL (ref 78.0–100.0)
MONO ABS: 0.5 10*3/uL (ref 0.1–1.0)
MONOS PCT: 7 %
Neutro Abs: 3.6 10*3/uL (ref 1.7–7.7)
Neutrophils Relative %: 47 %
Platelets: 229 10*3/uL (ref 150–400)
RBC: 5.12 MIL/uL (ref 4.22–5.81)
RDW: 13 % (ref 11.5–15.5)
WBC: 7.6 10*3/uL (ref 4.0–10.5)

## 2017-10-13 LAB — PROTIME-INR
INR: 1.04
Prothrombin Time: 13.5 seconds (ref 11.4–15.2)

## 2017-10-13 LAB — BASIC METABOLIC PANEL
ANION GAP: 7 (ref 5–15)
BUN: <5 mg/dL — ABNORMAL LOW (ref 6–20)
CHLORIDE: 109 mmol/L (ref 98–111)
CO2: 25 mmol/L (ref 22–32)
Calcium: 8.6 mg/dL — ABNORMAL LOW (ref 8.9–10.3)
Creatinine, Ser: 0.64 mg/dL (ref 0.61–1.24)
GFR calc non Af Amer: >60 mL/min (ref >60)
GLUCOSE: 95 mg/dL (ref 70–99)
Potassium: 3.6 mmol/L (ref 3.5–5.1)
Sodium: 141 mmol/L (ref 135–145)

## 2017-10-13 MED ORDER — LIDOCAINE HCL (PF) 1 % IJ SOLN
INTRAMUSCULAR | Status: AC
  Filled 2017-10-13: qty 30

## 2017-10-13 MED ORDER — FENTANYL CITRATE (PF) 100 MCG/2ML IJ SOLN
INTRAMUSCULAR | Status: AC
  Filled 2017-10-13: qty 4

## 2017-10-13 MED ORDER — MIDAZOLAM HCL 2 MG/2ML IJ SOLN
INTRAMUSCULAR | Status: AC | PRN
  Administered 2017-10-13: 1 mg via INTRAVENOUS

## 2017-10-13 MED ORDER — FENTANYL CITRATE (PF) 100 MCG/2ML IJ SOLN
INTRAMUSCULAR | Status: AC | PRN
  Administered 2017-10-13: 50 ug via INTRAVENOUS

## 2017-10-13 MED ORDER — MIDAZOLAM HCL 2 MG/2ML IJ SOLN
INTRAMUSCULAR | Status: AC
  Filled 2017-10-13: qty 4

## 2017-10-13 MED ORDER — SODIUM CHLORIDE 0.9 % IV SOLN
INTRAVENOUS | Status: DC
  Administered 2017-10-13: 12:00:00 via INTRAVENOUS

## 2017-10-13 NOTE — Procedures (Signed)
R inguinal seroma aspiration 60 cc clear fluid EBL 0 Comp 0

## 2017-10-13 NOTE — H&P (Signed)
Chief Complaint: Patient was seen in consultation today for right inguinal seroma aspiration/possible drain placement at the request of Tsuei,Matthew  Referring Physician(s): Tsuei,Matthew  Supervising Physician: Marybelle Killings  Patient Status: [facility] - Out-pt  History of Present Illness: Marc Mayo is a 32 y.o. male   Bilateral hernia repair 08/09/2017 Dr Jonny Ruiz Pt has had noted "knot" in Rt inguinal area not long after surgery Now in last weeks-- has enlarged and more painful CT 10/03/17:  IMPRESSION: 1. Well-circumscribed serous density fluid collection along the superolateral aspect of the right inguinal region. Suspect postoperative seroma versus lymphangioma. This area appears benign. No similar fluid collection elsewhere. 2. Extensive fat noted in each inguinal ring. There is scarring in each inguinal region. 3. No evident bowel obstruction. No abscess in the abdomen or pelvis. Appendix appears normal. 4. No evident renal or ureteral calculus on either side. No hydronephrosis. 5.  Mild aortic atherosclerosis. 6.  Hepatic steatosis.  Now scheduled for aspiration vs drain placement   Past Medical History:  Diagnosis Date  . ADHD (attention deficit hyperactivity disorder)   . Anxiety   . Bipolar affective disorder ([place])   . Bleeding stomach ulcer    "chronic" (08/09/2017)  . Chronic bronchitis ([place])   . Chronic eustachian tube dysfunction 11/2011  . Chronic lower back pain   . Depression   . Fatty liver   . GERD (gastroesophageal reflux disease)   . Hernia of abdominal wall   . History of kidney stones   . Hypertension   . IBS (irritable bowel syndrome)   . Migraine    "a couple/wk" (08/09/2017)  . OSA (obstructive sleep apnea)    "waiting on new mask; they took one 2 yr ago cause it didn't work right" (08/09/2017)    Past Surgical History:  Procedure Laterality Date  . CYSTOSCOPY W/ URETEROSCOPY W/ LITHOTRIPSY    . ESOPHAGOGASTRODUODENOSCOPY  2011     Dr. Oneida Alar: chronic non-H.pylori gastritis, normal small bowel biopsy   . FINGER FRACTURE SURGERY Right 08/10/2015   pins placed in third finger  . FRACTURE SURGERY    . INGUINAL HERNIA REPAIR Bilateral 08/09/2017   open; w/mesh  . INGUINAL HERNIA REPAIR Bilateral 08/09/2017   Procedure: OPEN HERNIA REPAIR INGUINAL ADULT BILATERAL WITH MESH;  Surgeon: Donnie Mesa, MD;  Location: [facility];  Service: General;  Laterality: Bilateral;  GENERAL AND TAP BLOCK  . INSERTION OF MESH Bilateral 08/09/2017   Procedure: INSERTION OF MESH;  Surgeon: Donnie Mesa, MD;  Location: [facility];  Service: General;  Laterality: Bilateral;  GENERAL AND TAP BLOCK  . MYRINGOTOMY WITH TUBE PLACEMENT  12/12/2011   Procedure: MYRINGOTOMY WITH TUBE PLACEMENT;  Surgeon: Jodi Marble, MD;  Location: [place];  Service: ENT;  Laterality: Bilateral;  Bilateral T Tubes  . NASAL ENDOSCOPY WITH EPISTAXIS CONTROL  12/12/2011   Procedure: NASAL ENDOSCOPY WITH EPISTAXIS CONTROL;  Surgeon: Jodi Marble, MD;  Location: [facility];  Service: ENT;  Laterality: Bilateral;  Direct Nasal Endoscopy with Cautery of Eustachian Tube Tori, Bilateral  . TONSILLECTOMY  2018  . TYMPANOSTOMY TUBE PLACEMENT Bilateral X 7    Allergies: Tramadol; Alprazolam; Aripiprazole; Celexa [citalopram hydrobromide]; Ketorolac; Lorazepam; Paxil [paroxetine hcl]; Zoloft [sertraline hcl]; and Prozac [fluoxetine hcl]  Medications: Prior to Admission medications   Medication Sig Start Date End Date Taking? Authorizing Provider  carvedilol (COREG) 6.25 MG tablet Take 6.25 mg by mouth 2 (two) times daily with a meal.  Yes [provider]  ibuprofen (ADVIL,MOTRIN) 200 MG tablet Take 400-800 mg by mouth every 6 (six) hours as needed for mild pain.    Yes [provider]  omeprazole (PRILOSEC) 20 MG capsule Take 20 mg by mouth daily.    Yes [provider]  oxyCODONE  (OXY IR/ROXICODONE) 5 MG immediate release tablet Take 1-2 tablets (5-10 mg total) by mouth every 6 (six) hours as needed for moderate pain. 08/10/17  Yes Donnie Mesa, MD  budesonide-formoterol [facility]) 160-4.5 MCG/ACT inhaler Inhale 2 puffs into the lungs 2 (two) times daily.    [provider]     Family History  Problem Relation Age of Onset  . Arthritis Mother   . COPD Mother   . Depression Mother   . Diabetes Mother   . Hyperlipidemia Mother   . Alcohol abuse Father   . Cancer Father   . COPD Father   . Stroke Father   . Early death Brother   . Asthma Daughter   . Colon cancer Neg Hx     Social History   Socioeconomic History  . Marital status: Married    Spouse name: Not on file  . Number of children: Not on file  . Years of education: Not on file  . Highest education level: Not on file  Occupational History  . Occupation: disability  Social Needs  . Financial resource strain: Not on file  . Food insecurity:    Worry: Not on file    Inability: Not on file  . Transportation needs:    Medical: Not on file    Non-medical: Not on file  Tobacco Use  . Smoking status: Current Every Day Smoker    Packs/day: 2.00    Years: 17.00    Pack years: 34.00    Types: Cigarettes  . Smokeless tobacco: Former Systems developer    Types: Chew  Substance and Sexual Activity  . Alcohol use: Not Currently    Comment: 08/09/2017 "couple beers q 2 months; if that"  . Drug use: Yes    Types: Marijuana    Comment: 08/09/2017 AS teenager smoked marijuana; nothing since"  . Sexual activity: Yes  Lifestyle  . Physical activity:    Days per week: Not on file    Minutes per session: Not on file  . Stress: Not on file  Relationships  . Social connections:    Talks on phone: Not on file    Gets together: Not on file    Attends religious service: Not on file    Active member of club or organization: Not on file    Attends meetings of clubs or organizations: Not on file     Relationship status: Not on file  Other Topics Concern  . Not on file  Social History Narrative  . Not on file     Review of Systems: A 12 point ROS discussed and pertinent positives are indicated in the HPI above.  All other systems are negative.  Review of Systems  Constitutional: Negative for activity change, fatigue and fever.  Respiratory: Negative for cough and shortness of breath.   Cardiovascular: Negative for chest pain.  Gastrointestinal: Negative for abdominal pain.  Musculoskeletal: Negative for back pain and gait problem.  Neurological: Negative for weakness.  Psychiatric/Behavioral: Negative for behavioral problems and confusion.    Vital Signs: BP (!) 143/97   Pulse 90   Temp 97.9 F (36.6 C) (Oral)   Resp 18   Ht 6\' 3"  (1.905  m)   Wt 279 lb (126.6 kg)   SpO2 99%   BMI 34.87 kg/m   Physical Exam  Constitutional: He is oriented to person, place, and time.  Cardiovascular: Normal rate, regular rhythm and normal heart sounds.  Pulmonary/Chest: Effort normal and breath sounds normal.  Abdominal: Soft. Bowel sounds are normal.  Palpable tender right low abd seroma ~6 cm  Musculoskeletal: Normal range of motion.  Neurological: He is alert and oriented to person, place, and time.  Skin: Skin is warm and dry.  Psychiatric: He has a normal mood and affect. His behavior is normal. Judgment and thought content normal.  Vitals reviewed.   Imaging: Ct Abdomen Pelvis W Contrast  Result Date: 10/03/2017 CLINICAL DATA:  Right lower quadrant pain and nausea. Soft tissue prominence right inguinal region. EXAM: CT ABDOMEN AND PELVIS WITH CONTRAST TECHNIQUE: Multidetector CT imaging of the abdomen and pelvis was performed using the standard protocol following bolus administration of intravenous contrast. Oral contrast was also administered CONTRAST:  162mL ISOVUE-300 IOPAMIDOL (ISOVUE-300) INJECTION 61% COMPARISON:  July 20, 2017 FINDINGS: Lower chest: Lung bases are clear.  Hepatobiliary: There is a degree of hepatic steatosis. No focal liver lesions are evident. Gallbladder wall is not appreciably thickened. There is no biliary duct dilatation. Pancreas: No pancreatic mass or inflammatory focus evident. Spleen: No splenic lesions appreciable. Adrenals/Urinary Tract: Adrenals bilaterally appear normal. Kidneys bilaterally show no evident mass or hydronephrosis on either side. There is a parenchymal cortical defect on the right, an anatomic variant. There is a small extrarenal pelvis on each side, an anatomic variant. There is no evident renal or ureteral calculus on either side. Urinary bladder is midline with wall thickness within normal limits. Stomach/Bowel: There is no appreciable bowel wall or mesenteric thickening. No evident bowel obstruction. No free air or portal venous air. Vascular/Lymphatic: There is mild aortic atherosclerosis. No aneurysm. Major mesenteric arterial vessels appear widely patent. No adenopathy is evident in the abdomen or pelvis. Reproductive: Prostate and seminal vesicles appear normal in size and contour. No evident pelvic mass. Other: Appendix appears normal. No abscess or ascites is evident in the abdomen or pelvis. There is scarring in each inguinal area with fat in each inguinal ring. There is a well-circumscribed fluid collection extending superior and lateral to the right inguinal canal measuring 6.7 x 4.0 x 4.6 cm. No similar fluid collections elsewhere. Musculoskeletal: No blastic or lytic bone lesions are evident. No intramuscular lesions are evident. IMPRESSION: 1. Well-circumscribed serous density fluid collection along the superolateral aspect of the right inguinal region. Suspect postoperative seroma versus lymphangioma. This area appears benign. No similar fluid collection elsewhere. 2. Extensive fat noted in each inguinal ring. There is scarring in each inguinal region. 3. No evident bowel obstruction. No abscess in the abdomen or pelvis.  Appendix appears normal. 4. No evident renal or ureteral calculus on either side. No hydronephrosis. 5.  Mild aortic atherosclerosis. 6.  Hepatic steatosis. Aortic Atherosclerosis (ICD10-I70.0). Electronically Signed   By: Lowella Grip III M.D.   On: 10/03/2017 10:01    Labs:  CBC: Recent Labs    12/17/16 2352 07/20/17 0029 08/09/17 0804 08/09/17 1537  WBC 8.3 8.6  --  18.9*  HGB 15.0 15.6 16.3 16.7  HCT 44.7 43.7 48.0 49.4  PLT 216 212  --  249    COAGS: No results for input(s): INR, APTT in the last 8760 hours.  BMP: Recent Labs    12/17/16 2352 07/20/17 0029 08/09/17 0804 08/09/17 1537  NA 138 138 140  --   K 3.4* 3.1* 3.8  --   CL 106 106 105  --   CO2 23 23  --   --   GLUCOSE 178* 122* 109*  --   BUN 8 7 <3*  --   CALCIUM 9.3 8.5*  --   --   CREATININE 0.71 0.71 0.50* 0.81  GFRNONAA >60 >60  --  >60  GFRAA >60 >60  --  >60    LIVER FUNCTION TESTS: Recent Labs    12/17/16 2352 07/20/17 0029  BILITOT 0.5 0.4  AST 43* 19  ALT 44 17  ALKPHOS 74 68  PROT 6.6 6.7  ALBUMIN 3.7 3.8    TUMOR MARKERS: No results for input(s): AFPTM, CEA, CA199, CHROMGRNA in the last 8760 hours.  Assessment and Plan:  Right inguinal seroma after hernia surgery 08/09/17 Enlarging and more painful Scheduled now for aspiration vs drain placement Risks and benefits discussed with the patient including bleeding, infection, damage to adjacent structures, and sepsis.  All of the patient's questions were answered, patient is agreeable to proceed. Consent signed and in chart.   Thank you for this interesting consult.  I greatly enjoyed meeting SORREN VALLIER and look forward to participating in their care.  A copy of this report was sent to the requesting provider on this date.  Electronically Signed: Lavonia Drafts, PA-C 10/13/2017, 11:56 AM   I spent a total of  30 Minutes   in face to face in clinical consultation, greater than 50% of which was  counseling/coordinating care for right inguinal seroma aspiration

## 2017-10-13 NOTE — Progress Notes (Signed)
Patient discharge from [facility]. Copy of AVS summary given to patient and he states understanding without any further questions. Patient instructed by Dr. Barbie Banner to call surgeon if swelling in area comes back. Patient accompanied by wife at discharge.

## 2017-10-13 NOTE — Discharge Instructions (Signed)
Incision and Drainage, Care After Refer to this sheet in the next few weeks. These instructions provide you with information about caring for yourself after your procedure. Your health care provider may also give you more specific instructions. Your treatment has been planned according to current medical practices, but problems sometimes occur. Call your health care provider if you have any problems or questions after your procedure. What can I expect after the procedure? After the procedure, it is common to have:  Pain or discomfort around your incision site.  Drainage from your incision.  Follow these instructions at home:  Take over-the-counter and prescription medicines only as told by your health care provider.  If you were prescribed an antibiotic medicine, take it as told by your health care provider.Do not stop taking the antibiotic even if you start to feel better.  Followinstructions from your health care provider about: ? How to take care of your incision. ? When and how you should change your packing and bandage (dressing). Wash your hands with soap and water before you change your dressing. If soap and water are not available, use hand sanitizer. ? When you should remove your dressing.  Do not take baths, swim, or use a hot tub until your health care provider approves.  Keep all follow-up visits as told by your health care provider. This is important.  Check your incision area every day for signs of infection. Check for: ? More redness, swelling, or pain. ? More fluid or blood. ? Warmth. ? Pus or a bad smell. Contact a health care provider if:  Your cyst or abscess returns.  You have a fever.  You have more redness, swelling, or pain around your incision.  You have more fluid or blood coming from your incision.  Your incision feels warm to the touch.  You have pus or a bad smell coming from your incision. Get help right away if:  You have severe pain or  bleeding.  You cannot eat or drink without vomiting.  You have decreased urine output.  You become short of breath.  You have chest pain.  You cough up blood.  The area where the incision and drainage occurred becomes numb or it tingles. This information is not intended to replace advice given to you by your health care provider. Make sure you discuss any questions you have with your health care provider. Document Released: 03/28/2011 Document Revised: 06/05/2015 Document Reviewed: 10/24/2014 Elsevier Interactive Patient Education  2018 [employer]. Moderate Conscious Sedation, Adult, Care After These instructions provide you with information about caring for yourself after your procedure. Your health care provider may also give you more specific instructions. Your treatment has been planned according to current medical practices, but problems sometimes occur. Call your health care provider if you have any problems or questions after your procedure. What can I expect after the procedure? After your procedure, it is common:  To feel sleepy for several hours.  To feel clumsy and have poor balance for several hours.  To have poor judgment for several hours.  To vomit if you eat too soon.  Follow these instructions at home: For at least 24 hours after the procedure:   Do not: ? Participate in activities where you could fall or become injured. ? Drive. ? Use heavy machinery. ? Drink alcohol. ? Take sleeping pills or medicines that cause drowsiness. ? Make important decisions or sign legal documents. ? Take care of children on your own.  Rest. Eating and  drinking  Follow the diet recommended by your health care provider.  If you vomit: ? Drink water, juice, or soup when you can drink without vomiting. ? Make sure you have little or no nausea before eating solid foods. General instructions  Have a responsible adult stay with you until you are awake and alert.  Take  over-the-counter and prescription medicines only as told by your health care provider.  If you smoke, do not smoke without supervision.  Keep all follow-up visits as told by your health care provider. This is important. Contact a health care provider if:  You keep feeling nauseous or you keep vomiting.  You feel light-headed.  You develop a rash.  You have a fever. Get help right away if:  You have trouble breathing. This information is not intended to replace advice given to you by your health care provider. Make sure you discuss any questions you have with your health care provider. Document Released: 10/24/2012 Document Revised: 06/08/2015 Document Reviewed: 04/25/2015 Elsevier Interactive Patient Education  [employer].

## 2017-10-31 DIAGNOSIS — M6740 Ganglion, unspecified site: Secondary | ICD-10-CM | POA: Diagnosis not present

## 2017-10-31 DIAGNOSIS — F330 Major depressive disorder, recurrent, mild: Secondary | ICD-10-CM | POA: Diagnosis not present

## 2017-10-31 DIAGNOSIS — F411 Generalized anxiety disorder: Secondary | ICD-10-CM | POA: Diagnosis not present

## 2017-11-10 ENCOUNTER — Encounter (HOSPITAL_BASED_OUTPATIENT_CLINIC_OR_DEPARTMENT_OTHER): Payer: Self-pay

## 2017-11-10 ENCOUNTER — Other Ambulatory Visit: Payer: Self-pay

## 2017-11-10 ENCOUNTER — Emergency Department (HOSPITAL_BASED_OUTPATIENT_CLINIC_OR_DEPARTMENT_OTHER)
Admission: EM | Admit: 2017-11-10 | Discharge: 2017-11-10 | Disposition: A | Discharge status: Home / Self Care | Payer: Medicare Other | Attending: Emergency Medicine | Admitting: Emergency Medicine

## 2017-11-10 ENCOUNTER — Emergency Department (HOSPITAL_BASED_OUTPATIENT_CLINIC_OR_DEPARTMENT_OTHER): Payer: Medicare Other

## 2017-11-10 DIAGNOSIS — R197 Diarrhea, unspecified: Secondary | ICD-10-CM | POA: Diagnosis not present

## 2017-11-10 DIAGNOSIS — R110 Nausea: Secondary | ICD-10-CM | POA: Diagnosis not present

## 2017-11-10 DIAGNOSIS — K4090 Unilateral inguinal hernia, without obstruction or gangrene, not specified as recurrent: Secondary | ICD-10-CM | POA: Diagnosis not present

## 2017-11-10 DIAGNOSIS — R109 Unspecified abdominal pain: Secondary | ICD-10-CM | POA: Insufficient documentation

## 2017-11-10 DIAGNOSIS — Z79899 Other long term (current) drug therapy: Secondary | ICD-10-CM | POA: Insufficient documentation

## 2017-11-10 DIAGNOSIS — F17210 Nicotine dependence, cigarettes, uncomplicated: Secondary | ICD-10-CM | POA: Diagnosis not present

## 2017-11-10 DIAGNOSIS — R1030 Lower abdominal pain, unspecified: Secondary | ICD-10-CM | POA: Insufficient documentation

## 2017-11-10 DIAGNOSIS — R1084 Generalized abdominal pain: Secondary | ICD-10-CM | POA: Diagnosis not present

## 2017-11-10 LAB — URINALYSIS, ROUTINE W REFLEX MICROSCOPIC
Bilirubin Urine: NEGATIVE
GLUCOSE, UA: NEGATIVE mg/dL
Hgb urine dipstick: NEGATIVE
Ketones, ur: NEGATIVE mg/dL
LEUKOCYTES UA: NEGATIVE
Nitrite: NEGATIVE
PH: 6 (ref 5.0–8.0)
Protein, ur: NEGATIVE mg/dL
SPECIFIC GRAVITY, URINE: 1.02 (ref 1.005–1.030)

## 2017-11-10 MED ORDER — CYCLOBENZAPRINE HCL 10 MG PO TABS: 10.0000 mg | ORAL_TABLET | Freq: Three times a day (TID) | ORAL | 0 refills | Status: DC | PRN

## 2017-11-10 MED FILL — CYCLOBENZAPRINE HCL 10 MG T: 10 | 7 days supply | Qty: 20 | Fill #0

## 2017-11-10 NOTE — ED Notes (Signed)
C/o bilateral flank pain radiating into groin x 3 days  States has seen blood in urine  Increased freq, diffing urinating

## 2017-11-10 NOTE — ED Notes (Signed)
ED Provider at bedside. 

## 2017-11-10 NOTE — ED Triage Notes (Signed)
C/o bilat flank pain, dysuria, freq x 3-4 days-states feels like a kidney stone-NAD-steady gait

## 2017-11-10 NOTE — ED Notes (Signed)
Pt requesting to see MD, states he is allergic to lidcaine patches , requesting pain med and muscle relaxer,

## 2017-11-10 NOTE — ED Provider Notes (Signed)
[facility] EMERGENCY DEPARTMENT Provider Note   CSN: [id number] Arrival date & time: 11/10/17  1543     History   Chief Complaint Chief Complaint  Patient presents with  . Flank Pain    HPI Marc Mayo is a 32 y.o. male.  HPI She reports that he had bilateral flank and lower abdominal pain for about 4 days.  Ports he has seen blood in the urine and suspects he has a kidney stone.  He reports he had a kidney stone a number of years ago and had to get a stent.  No fever, no vomiting.  He has had some nausea.  Small amount of diarrhea, no constipation.  His wife notes that his right testicle has been slightly larger than the left ever since he had his inguinal hernia repair surgery.  This is not new as of the more recent symptoms.  Patient reports both testicles feel slightly uncomfortable and achy. Past Medical History:  Diagnosis Date  . ADHD (attention deficit hyperactivity disorder)   . Anxiety   . Bipolar affective disorder ([place])   . Bleeding stomach ulcer    "chronic" (08/09/2017)  . Chronic bronchitis ([place])   . Chronic eustachian tube dysfunction 11/2011  . Chronic lower back pain   . Depression   . Fatty liver   . GERD (gastroesophageal reflux disease)   . Hernia of abdominal wall   . History of kidney stones   . Hypertension   . IBS (irritable bowel syndrome)   . Migraine    "a couple/wk" (08/09/2017)  . OSA (obstructive sleep apnea)    "waiting on new mask; they took one 2 yr ago cause it didn't work right" (08/09/2017)    Patient Active Problem List   Diagnosis Date Noted  . Obstructive sleep apnea on CPAP 08/09/2017  . Suicidal ideation 08/25/2016  . Varicose vein of leg 04/22/2016  . BMI 37.0-37.9, adult 04/22/2016  . Mild episode of recurrent major depressive disorder ([place]) 02/18/2016  . GAD (generalized anxiety disorder) 02/18/2016  . Essential hypertension 01/08/2016  . Cough 06/18/2012  . Dyspnea 05/01/2012  . Fatty liver 04/06/2012    . Smoker 04/11/2011  . OSA (obstructive sleep apnea) 02/03/2011  . IBS 02/22/2010  . BIPOLAR AFFECTIVE DISORDER 12/08/2009  . GERD 12/08/2009  . FATTY LIVER DISEASE 12/08/2009  . DIARRHEA, CHRONIC 12/08/2009  . ABDOMINAL PAIN 12/08/2009  . CHEST PAIN UNSPECIFIED 03/17/2008    Past Surgical History:  Procedure Laterality Date  . CYSTOSCOPY W/ URETEROSCOPY W/ LITHOTRIPSY    . ESOPHAGOGASTRODUODENOSCOPY  2011   Dr. Oneida Alar: chronic non-H.pylori gastritis, normal small bowel biopsy   . FINGER FRACTURE SURGERY Right 08/10/2015   pins placed in third finger  . FRACTURE SURGERY    . INGUINAL HERNIA REPAIR Bilateral 08/09/2017   open; w/mesh  . INGUINAL HERNIA REPAIR Bilateral 08/09/2017   Procedure: OPEN HERNIA REPAIR INGUINAL ADULT BILATERAL WITH MESH;  Surgeon: Donnie Mesa, MD;  Location: [facility];  Service: General;  Laterality: Bilateral;  GENERAL AND TAP BLOCK  . INSERTION OF MESH Bilateral 08/09/2017   Procedure: INSERTION OF MESH;  Surgeon: Donnie Mesa, MD;  Location: [facility];  Service: General;  Laterality: Bilateral;  GENERAL AND TAP BLOCK  . MYRINGOTOMY WITH TUBE PLACEMENT  12/12/2011   Procedure: MYRINGOTOMY WITH TUBE PLACEMENT;  Surgeon: Jodi Marble, MD;  Location: [facility];  Service: ENT;  Laterality: Bilateral;  Bilateral T Tubes  . NASAL ENDOSCOPY WITH  EPISTAXIS CONTROL  12/12/2011   Procedure: NASAL ENDOSCOPY WITH EPISTAXIS CONTROL;  Surgeon: Jodi Marble, MD;  Location: [facility];  Service: ENT;  Laterality: Bilateral;  Direct Nasal Endoscopy with Cautery of Eustachian Tube Tori, Bilateral  . TONSILLECTOMY  2018  . TYMPANOSTOMY TUBE PLACEMENT Bilateral X 7        Home Medications    Prior to Admission medications   Medication Sig Start Date End Date Taking? Authorizing Provider  budesonide-formoterol (SYMBICORT) 160-4.5 MCG/ACT inhaler Inhale 2 puffs into the lungs 2 (two) times daily.     [provider]  carvedilol (COREG) 6.25 MG tablet Take 6.25 mg by mouth 2 (two) times daily with a meal.    [provider]  cyclobenzaprine (FLEXERIL) 10 MG tablet Take 1 tablet (10 mg total) by mouth 3 (three) times daily as needed for muscle spasms. 11/10/17   Charlesetta Shanks, MD  ibuprofen (ADVIL,MOTRIN) 200 MG tablet Take 400-800 mg by mouth every 6 (six) hours as needed for mild pain.     [provider]  omeprazole (PRILOSEC) 20 MG capsule Take 20 mg by mouth daily.     [provider]  oxyCODONE (OXY IR/ROXICODONE) 5 MG immediate release tablet Take 1-2 tablets (5-10 mg total) by mouth every 6 (six) hours as needed for moderate pain. 08/10/17   Donnie Mesa, MD    Family History Family History  Problem Relation Age of Onset  . Arthritis Mother   . COPD Mother   . Depression Mother   . Diabetes Mother   . Hyperlipidemia Mother   . Alcohol abuse Father   . Cancer Father   . COPD Father   . Stroke Father   . Early death Brother   . Asthma Daughter   . Colon cancer Neg Hx     Social History Social History   Tobacco Use  . Smoking status: Current Every Day Smoker    Packs/day: 2.00    Years: 17.00    Pack years: 34.00    Types: Cigarettes  . Smokeless tobacco: Former Systems developer    Types: Chew  Substance Use Topics  . Alcohol use: Not Currently  . Drug use: Not Currently    Types: Marijuana     Allergies   Tramadol; Alprazolam; Aripiprazole; Celexa [citalopram hydrobromide]; Ketorolac; Lorazepam; Paxil [paroxetine hcl]; Zoloft [sertraline hcl]; and Prozac [fluoxetine hcl]   Review of Systems Review of Systems 10 Systems reviewed and are negative for acute change except as noted in the HPI.   Physical Exam Updated Vital Signs BP (!) 138/104 (BP Location: Left Arm)   Pulse 98   Temp 98.5 F (36.9 C) (Oral)   Resp 18   Ht 6\' 3"  (1.905 m)   Wt 126.6 kg   SpO2 100%   BMI 34.87 kg/m   Physical Exam  Constitutional: He is  oriented to person, place, and time. He appears well-developed and well-nourished. No distress.  HENT:  Head: Normocephalic and atraumatic.  Cardiovascular: Normal rate, regular rhythm and normal heart sounds.  Pulmonary/Chest: Effort normal and breath sounds normal.  Abdominal: Soft.  Mild diffuse lower abdominal discomfort to palpation without any guarding.  Genitourinary:  Genitourinary Comments: Penis normal appearance.  Right testicle slightly more enlarged relative to the left.  Consistent with hydrocele.  Neither testicle severely tender.  Patient endorses mild discomfort.  No significant pain with compression over the spermatic cords.  Patient has well-healed inguinal arms bilaterally from prior inguinal hernia repairs.  There is  no mass or fullness in this area.  Is nontender.  Musculoskeletal: Normal range of motion. He exhibits no edema.  Neurological: He is alert and oriented to person, place, and time. He exhibits normal muscle tone. Coordination normal.  Skin: Skin is warm and dry.  Psychiatric: He has a normal mood and affect.     ED Treatments / Results  Labs (all labs ordered are listed, but only abnormal results are displayed) Labs Reviewed  URINALYSIS, ROUTINE W REFLEX MICROSCOPIC    EKG None  Radiology Ct Renal Stone Study  Result Date: 11/10/2017 CLINICAL DATA:  Acute bilateral flank pain since this morning. History of renal stone disease. EXAM: CT ABDOMEN AND PELVIS WITHOUT CONTRAST TECHNIQUE: Multidetector CT imaging of the abdomen and pelvis was performed following the standard protocol without IV contrast. COMPARISON:  10/03/2017 FINDINGS: Lower chest: The lung bases are clear of acute process. No pleural effusion or pulmonary lesions. The heart is normal in size. No pericardial effusion. The distal esophagus and aorta are unremarkable. Stable 3 mm nodule in the right middle lobe on image number 1. Hepatobiliary: No focal hepatic lesions or intrahepatic biliary  dilatation. The gallbladder is moderately contracted. Pancreas: No mass, inflammation or ductal dilatation. Spleen: Normal size.  No focal lesions. Adrenals/Urinary Tract: The adrenal glands and kidneys are normal. No renal, ureteral or bladder calculi or mass. Stomach/Bowel: The stomach, duodenum, small bowel and colon are grossly normal without oral contrast. No inflammatory changes, mass lesions or obstructive findings. The terminal ileum and appendix are normal. Vascular/Lymphatic: The aorta is normal in caliber. Scattered atheroscerlotic calcifications, advanced for age. No mesenteric of retroperitoneal mass or adenopathy. Small scattered lymph nodes are noted. Reproductive: The prostate gland and seminal vesicles are unremarkable. Other: Bilateral fat containing inguinal hernias are again noted. Small inguinal lymph nodes. Musculoskeletal: No significant bony findings. IMPRESSION: 1. No acute abdominal/pelvic findings, mass lesions or adenopathy. 2. No renal, ureteral or bladder calculi. 3. Stable bilateral inguinal hernias containing fat. 4. Age advanced vascular calcifications. Electronically Signed   By: Marijo Sanes M.D.   On: 11/10/2017 16:37    Procedures Procedures (including critical care time)  Medications Ordered in ED Medications - No data to display   Initial Impression / Assessment and Plan / ED Course  I have reviewed the triage vital signs and the nursing notes.  Pertinent labs & imaging results that were available during my care of the patient were reviewed by me and considered in my medical decision making (see chart for details).    Patient describes 4 days of bilateral flank pain and seeing blood in the urine.  Urine test negative today.  CT shows no anomaly.  Exam of the testicles does not suggest torsion or epididymitis.  I strongly encourage the patient to get swab for STD testing.  Advised this would be standard of care for symptoms and sexually active male.  Patient and  wife indicated they were okay with discussing STD and rationale for testing with all family members present.  His wife insisted they had been together for 14 years and it was not possible to have STD.  Patient declined testing.  Patient may have muscular skeletal pain.  This is bilateral flanks around to the lower abdomen.  He has had inguinal hernia repair but these appear stable at this time.  There is no mass or fullness or tenderness in the inguinal canals.  Patient initially reported allergy to muscle relaxers, but subsequent prior to discharge advised that he  could take Flexeril.  Patient also requested something for managing anxiety and panic, he is counseled that this will need to be managed through his primary care doctor.  Final Clinical Impressions(s) / ED Diagnoses   Final diagnoses:  Flank pain  Lower abdominal pain    ED Discharge Orders         Ordered    cyclobenzaprine (FLEXERIL) 10 MG tablet  3 times daily PRN     11/10/17 1656           Charlesetta Shanks, MD 11/10/17 1657

## 2017-11-10 NOTE — Discharge Instructions (Signed)
1.  Follow-up with your family doctor for recheck.  You may need a referral to a urologist if you continue to have discomfort with urination but no evident source of infection. 2.  Return if you develop fever, vomiting, worsening pain or other concerning symptoms. 3.  You will need to discuss medications for panic and anxiety with your primary care provider.

## 2017-11-16 DIAGNOSIS — F330 Major depressive disorder, recurrent, mild: Secondary | ICD-10-CM | POA: Diagnosis not present

## 2017-11-16 DIAGNOSIS — F411 Generalized anxiety disorder: Secondary | ICD-10-CM | POA: Diagnosis not present

## 2017-12-25 DIAGNOSIS — M5441 Lumbago with sciatica, right side: Secondary | ICD-10-CM | POA: Diagnosis not present

## 2017-12-25 DIAGNOSIS — G8929 Other chronic pain: Secondary | ICD-10-CM | POA: Diagnosis not present

## 2018-01-09 DIAGNOSIS — G894 Chronic pain syndrome: Secondary | ICD-10-CM | POA: Diagnosis not present

## 2018-01-09 DIAGNOSIS — M47816 Spondylosis without myelopathy or radiculopathy, lumbar region: Secondary | ICD-10-CM | POA: Diagnosis not present

## 2018-01-09 DIAGNOSIS — M5416 Radiculopathy, lumbar region: Secondary | ICD-10-CM | POA: Diagnosis not present

## 2018-01-09 DIAGNOSIS — M5136 Other intervertebral disc degeneration, lumbar region: Secondary | ICD-10-CM | POA: Diagnosis not present

## 2018-01-12 ENCOUNTER — Emergency Department (HOSPITAL_BASED_OUTPATIENT_CLINIC_OR_DEPARTMENT_OTHER)
Admission: EM | Admit: 2018-01-12 | Discharge: 2018-01-12 | Disposition: A | Discharge status: Home / Self Care | Payer: Medicare Other | Attending: Emergency Medicine | Admitting: Emergency Medicine

## 2018-01-12 ENCOUNTER — Encounter (HOSPITAL_BASED_OUTPATIENT_CLINIC_OR_DEPARTMENT_OTHER): Payer: Self-pay | Admitting: *Deleted

## 2018-01-12 ENCOUNTER — Other Ambulatory Visit: Payer: Self-pay

## 2018-01-12 DIAGNOSIS — Z79899 Other long term (current) drug therapy: Secondary | ICD-10-CM | POA: Diagnosis not present

## 2018-01-12 DIAGNOSIS — I10 Essential (primary) hypertension: Secondary | ICD-10-CM | POA: Diagnosis not present

## 2018-01-12 DIAGNOSIS — R1032 Left lower quadrant pain: Secondary | ICD-10-CM | POA: Insufficient documentation

## 2018-01-12 DIAGNOSIS — R110 Nausea: Secondary | ICD-10-CM | POA: Diagnosis not present

## 2018-01-12 DIAGNOSIS — F17210 Nicotine dependence, cigarettes, uncomplicated: Secondary | ICD-10-CM | POA: Diagnosis not present

## 2018-01-12 DIAGNOSIS — R1030 Lower abdominal pain, unspecified: Secondary | ICD-10-CM | POA: Diagnosis not present

## 2018-01-12 MED ORDER — HYDROCODONE-ACETAMINOPHEN 5-325 MG PO TABS
1.0000 | ORAL_TABLET | Freq: Once | ORAL | Status: AC
  Administered 2018-01-12: 1 via ORAL
  Filled 2018-01-12: qty 1

## 2018-01-12 MED ORDER — DICLOFENAC SODIUM 75 MG PO TBEC: 75.0000 mg | DELAYED_RELEASE_TABLET | Freq: Two times a day (BID) | ORAL | 0 refills | Status: DC

## 2018-01-12 NOTE — ED Provider Notes (Signed)
[facility] EMERGENCY DEPARTMENT Provider Note   CSN: [id number] Arrival date & time: 01/12/18  1843     History   Chief Complaint Chief Complaint  Patient presents with  . Abdominal Pain    HPI Marc Mayo is a 32 y.o. male with history of ADHD, anxiety, bipolar disorder, chronic bronchitis, GERD, migraines presenting for evaluation of acute onset, progressively worsening lower abdominal pain beginning this morning.  Notes pain is severe pressure-like sensation that radiates from his groin up to his abdomen.  He had bilateral hernia repair in July and has not had any symptoms until today.  Notes nausea but no vomiting.  Denies fevers, diarrhea or constipation.  He does note some urinary frequency and dysuria but denies hematuria.  Has been taking ibuprofen and Tylenol without relief of his symptoms.  He does lift his children frequently but denies any other heavy lifting.  Attempted to contact his general surgeon Dr. Georgette Dover but he was out of the office.  The history is provided by the patient.    Past Medical History:  Diagnosis Date  . ADHD (attention deficit hyperactivity disorder)   . Anxiety   . Bipolar affective disorder ([place])   . Bleeding stomach ulcer    "chronic" (08/09/2017)  . Chronic bronchitis ([place])   . Chronic eustachian tube dysfunction 11/2011  . Chronic lower back pain   . Depression   . Fatty liver   . GERD (gastroesophageal reflux disease)   . Hernia of abdominal wall   . History of kidney stones   . Hypertension   . IBS (irritable bowel syndrome)   . Migraine    "a couple/wk" (08/09/2017)  . OSA (obstructive sleep apnea)    "waiting on new mask; they took one 2 yr ago cause it didn't work right" (08/09/2017)    Patient Active Problem List   Diagnosis Date Noted  . Obstructive sleep apnea on CPAP 08/09/2017  . Suicidal ideation 08/25/2016  . Varicose vein of leg 04/22/2016  . BMI 37.0-37.9, adult 04/22/2016  . Mild episode of recurrent  major depressive disorder ([place]) 02/18/2016  . GAD (generalized anxiety disorder) 02/18/2016  . Essential hypertension 01/08/2016  . Cough 06/18/2012  . Dyspnea 05/01/2012  . Fatty liver 04/06/2012  . Smoker 04/11/2011  . OSA (obstructive sleep apnea) 02/03/2011  . IBS 02/22/2010  . BIPOLAR AFFECTIVE DISORDER 12/08/2009  . GERD 12/08/2009  . FATTY LIVER DISEASE 12/08/2009  . DIARRHEA, CHRONIC 12/08/2009  . ABDOMINAL PAIN 12/08/2009  . CHEST PAIN UNSPECIFIED 03/17/2008    Past Surgical History:  Procedure Laterality Date  . CYSTOSCOPY W/ URETEROSCOPY W/ LITHOTRIPSY    . ESOPHAGOGASTRODUODENOSCOPY  2011   Dr. Oneida Alar: chronic non-H.pylori gastritis, normal small bowel biopsy   . FINGER FRACTURE SURGERY Right 08/10/2015   pins placed in third finger  . FRACTURE SURGERY    . INGUINAL HERNIA REPAIR Bilateral 08/09/2017   open; w/mesh  . INGUINAL HERNIA REPAIR Bilateral 08/09/2017   Procedure: OPEN HERNIA REPAIR INGUINAL ADULT BILATERAL WITH MESH;  Surgeon: Donnie Mesa, MD;  Location: [facility];  Service: General;  Laterality: Bilateral;  GENERAL AND TAP BLOCK  . INSERTION OF MESH Bilateral 08/09/2017   Procedure: INSERTION OF MESH;  Surgeon: Donnie Mesa, MD;  Location: [facility];  Service: General;  Laterality: Bilateral;  GENERAL AND TAP BLOCK  . MYRINGOTOMY WITH TUBE PLACEMENT  12/12/2011   Procedure: MYRINGOTOMY WITH TUBE PLACEMENT;  Surgeon: Jodi Marble, MD;  Location: [facility];  Service: ENT;  Laterality: Bilateral;  Bilateral T Tubes  . NASAL ENDOSCOPY WITH EPISTAXIS CONTROL  12/12/2011   Procedure: NASAL ENDOSCOPY WITH EPISTAXIS CONTROL;  Surgeon: Jodi Marble, MD;  Location: [facility];  Service: ENT;  Laterality: Bilateral;  Direct Nasal Endoscopy with Cautery of Eustachian Tube Tori, Bilateral  . TONSILLECTOMY  2018  . TYMPANOSTOMY TUBE PLACEMENT Bilateral X 7        Home Medications    Prior to  Admission medications   Medication Sig Start Date End Date Taking? Authorizing Provider  budesonide-formoterol (SYMBICORT) 160-4.5 MCG/ACT inhaler Inhale 2 puffs into the lungs 2 (two) times daily.    [provider]  carvedilol (COREG) 6.25 MG tablet Take 6.25 mg by mouth 2 (two) times daily with a meal.    [provider]  cyclobenzaprine (FLEXERIL) 10 MG tablet Take 1 tablet (10 mg total) by mouth 3 (three) times daily as needed for muscle spasms. 11/10/17   Charlesetta Shanks, MD  diclofenac (VOLTAREN) 75 MG EC tablet Take 1 tablet (75 mg total) by mouth 2 (two) times daily. 01/12/18   Shree Espey A, PA-C  ibuprofen (ADVIL,MOTRIN) 200 MG tablet Take 400-800 mg by mouth every 6 (six) hours as needed for mild pain.     [provider]  omeprazole (PRILOSEC) 20 MG capsule Take 20 mg by mouth daily.     [provider]  oxyCODONE (OXY IR/ROXICODONE) 5 MG immediate release tablet Take 1-2 tablets (5-10 mg total) by mouth every 6 (six) hours as needed for moderate pain. 08/10/17   Donnie Mesa, MD    Family History Family History  Problem Relation Age of Onset  . Arthritis Mother   . COPD Mother   . Depression Mother   . Diabetes Mother   . Hyperlipidemia Mother   . Alcohol abuse Father   . Cancer Father   . COPD Father   . Stroke Father   . Early death Brother   . Asthma Daughter   . Colon cancer Neg Hx     Social History Social History   Tobacco Use  . Smoking status: Current Every Day Smoker    Packs/day: 2.00    Years: 17.00    Pack years: 34.00    Types: Cigarettes  . Smokeless tobacco: Former Systems developer    Types: Chew  Substance Use Topics  . Alcohol use: Not Currently  . Drug use: Not Currently    Types: Marijuana     Allergies   Tramadol; Alprazolam; Aripiprazole; Celexa [citalopram hydrobromide]; Ketorolac; Lorazepam; Paxil [paroxetine hcl]; Zoloft [sertraline hcl]; and Prozac [fluoxetine hcl]   Review of Systems Review of Systems    Constitutional: Negative for chills and fever.  Respiratory: Negative for shortness of breath.   Cardiovascular: Negative for chest pain.  Gastrointestinal: Positive for abdominal pain and nausea. Negative for constipation, diarrhea and vomiting.  Genitourinary: Positive for dysuria and frequency. Negative for hematuria.     Physical Exam Updated Vital Signs BP (!) 146/87 (BP Location: Right Arm)   Pulse 93   Temp 98.1 F (36.7 C) (Oral)   Resp 16   Ht 6\' 3"  (1.905 m)   Wt 130.6 kg   SpO2 100%   BMI 36.00 kg/m   Physical Exam Vitals signs and nursing note reviewed.  Constitutional:      General: He is not in acute distress.    Appearance: He is well-developed.  HENT:     Head: Normocephalic and atraumatic.  Eyes:     General:        Right eye: No discharge.        Left eye: No discharge.     Conjunctiva/sclera: Conjunctivae normal.  Neck:     Vascular: No JVD.     Trachea: No tracheal deviation.  Cardiovascular:     Rate and Rhythm: Normal rate and regular rhythm.  Pulmonary:     Effort: Pulmonary effort is normal.     Breath sounds: Normal breath sounds.  Abdominal:     General: Abdomen is protuberant. Bowel sounds are increased. There is no distension.     Palpations: Abdomen is soft.     Tenderness: There is abdominal tenderness in the right lower quadrant, suprapubic area, left upper quadrant and left lower quadrant. There is no right CVA tenderness, left CVA tenderness, guarding or rebound. Negative signs include Murphy's sign, Rovsing's sign and McBurney's sign.  Skin:    General: Skin is warm and dry.     Findings: No erythema.  Neurological:     Mental Status: He is alert.  Psychiatric:        Behavior: Behavior normal.      ED Treatments / Results  Labs (all labs ordered are listed, but only abnormal results are displayed) Labs Reviewed - No data to display  EKG None  Radiology No results found.  Procedures Procedures (including critical  care time)  Medications Ordered in ED Medications  HYDROcodone-acetaminophen (NORCO/VICODIN) 5-325 MG per tablet 1 tablet (1 tablet Oral Given 01/12/18 2013)     Initial Impression / Assessment and Plan / ED Course  I have reviewed the triage vital signs and the nursing notes.  Pertinent labs & imaging results that were available during my care of the patient were reviewed by me and considered in my medical decision making (see chart for details).     Patient presenting with acute onset of lower abdominal pain beginning this morning.  He is 6 months status post bilateral hernia repair.  He is afebrile, vital signs are stable.  He is nontoxic in appearance, resting comfortably in chair.  No peritoneal signs on examination of the abdomen.  We discussed obtaining lab work, UA, and possible imaging for further evaluation.  Concern for possible recurrence of hernias.  8:00PM Patient wants to leave against medical advice.  He states that he cannot stay any longer and he needs to go home to care for 1 of his children and make food for his family. Patient understands that his actions will lead to inadequate medical workup, and that he is at risk of complications of missed diagnosis, which includes morbidity and mortality.  Alternative options discussed. Opportunity to change mind given.  He is requesting a prescription for pain medicine "to get me through the next few days ".  I explained to the patient that I cannot prescribe narcotic pain medicines without further evaluation.  However, he was given 1 tablet of hydrocodone in the ED for management of his pain.  [place] controlled substance registry was queried with high overdose risk score. Patient is demonstrating good capacity to make decision. Patient understands that he needs to return to the ER immediately if his symptoms get worse.  Will discharge with Voltaren for pain.  Advised to avoid other NSAIDs while taking this.   Final  Clinical Impressions(s) / ED Diagnoses   Final diagnoses:  Lower abdominal pain    ED Discharge Orders  Ordered    diclofenac (VOLTAREN) 75 MG EC tablet  2 times daily     01/12/18 [address] 01/13/18 0017    Virgel Manifold, MD 01/13/18 [phone]

## 2018-01-12 NOTE — ED Triage Notes (Signed)
Pt is here due to abdominal pain which feels like it is "shooting" from groin to abdomen.  Pt states that he had bilateral hernia surgery in July and he has been feeling fine, no pain until he was awakened with this pain this am.  Pt denies any recent straining which could have caused this. LBM today.  No v/d or fever with this.

## 2018-01-12 NOTE — Discharge Instructions (Addendum)
Start taking Voltaren twice daily with meals for pain.  Do not take ibuprofen, Advil, Aleve, or Motrin while taking this medication.  You can also take 500 to 1000 mg of Tylenol every 6 hours as needed for pain.  Follow-up with your general surgeon for reevaluation.  Return to the emergency department if any concerning signs or symptoms develop such as fevers, worsening pain, or persistent vomiting.

## 2018-03-21 ENCOUNTER — Other Ambulatory Visit: Payer: Self-pay

## 2018-03-21 ENCOUNTER — Encounter (HOSPITAL_BASED_OUTPATIENT_CLINIC_OR_DEPARTMENT_OTHER): Payer: Self-pay

## 2018-03-21 ENCOUNTER — Emergency Department (HOSPITAL_BASED_OUTPATIENT_CLINIC_OR_DEPARTMENT_OTHER)
Admission: EM | Admit: 2018-03-21 | Discharge: 2018-03-21 | Disposition: A | Discharge status: Home / Self Care | Payer: Medicare Other | Attending: Emergency Medicine | Admitting: Emergency Medicine

## 2018-03-21 ENCOUNTER — Emergency Department (HOSPITAL_BASED_OUTPATIENT_CLINIC_OR_DEPARTMENT_OTHER): Payer: Medicare Other

## 2018-03-21 DIAGNOSIS — R079 Chest pain, unspecified: Secondary | ICD-10-CM | POA: Diagnosis present

## 2018-03-21 DIAGNOSIS — J40 Bronchitis, not specified as acute or chronic: Secondary | ICD-10-CM

## 2018-03-21 DIAGNOSIS — R0789 Other chest pain: Secondary | ICD-10-CM

## 2018-03-21 DIAGNOSIS — Z79899 Other long term (current) drug therapy: Secondary | ICD-10-CM | POA: Insufficient documentation

## 2018-03-21 DIAGNOSIS — I10 Essential (primary) hypertension: Secondary | ICD-10-CM | POA: Diagnosis not present

## 2018-03-21 DIAGNOSIS — M79602 Pain in left arm: Secondary | ICD-10-CM | POA: Diagnosis not present

## 2018-03-21 DIAGNOSIS — F17210 Nicotine dependence, cigarettes, uncomplicated: Secondary | ICD-10-CM | POA: Diagnosis not present

## 2018-03-21 DIAGNOSIS — M25512 Pain in left shoulder: Secondary | ICD-10-CM | POA: Diagnosis not present

## 2018-03-21 LAB — CBC
HEMATOCRIT: 44.9 % (ref 39.0–52.0)
HEMOGLOBIN: 14.8 g/dL (ref 13.0–17.0)
MCH: 30.9 pg (ref 26.0–34.0)
MCHC: 33 g/dL (ref 30.0–36.0)
MCV: 93.7 fL (ref 80.0–100.0)
Platelets: 266 10*3/uL (ref 150–400)
RBC: 4.79 MIL/uL (ref 4.22–5.81)
RDW: 13.2 % (ref 11.5–15.5)
WBC: 9.3 10*3/uL (ref 4.0–10.5)
nRBC: 0 % (ref 0.0–0.2)

## 2018-03-21 LAB — BASIC METABOLIC PANEL
ANION GAP: 7 (ref 5–15)
BUN: 8 mg/dL (ref 6–20)
CHLORIDE: 106 mmol/L (ref 98–111)
CO2: 26 mmol/L (ref 22–32)
Calcium: 8.9 mg/dL (ref 8.9–10.3)
Creatinine, Ser: 0.7 mg/dL (ref 0.61–1.24)
GFR calc Af Amer: >60 mL/min (ref >60)
GLUCOSE: 93 mg/dL (ref 70–99)
POTASSIUM: 4 mmol/L (ref 3.5–5.1)
Sodium: 139 mmol/L (ref 135–145)

## 2018-03-21 LAB — TROPONIN I

## 2018-03-21 MED ORDER — PREDNISONE 50 MG PO TABS
60.0000 mg | ORAL_TABLET | Freq: Once | ORAL | Status: AC
  Administered 2018-03-21: 60 mg via ORAL
  Filled 2018-03-21: qty 1

## 2018-03-21 MED ORDER — PREDNISONE 50 MG PO TABS: 50.0000 mg | ORAL_TABLET | Freq: Every day | ORAL | 0 refills | Status: DC

## 2018-03-21 MED ORDER — ACETAMINOPHEN-CODEINE 300-30 MG PO TABS: 1.0000 | ORAL_TABLET | ORAL | 0 refills | Status: DC | PRN

## 2018-03-21 MED ORDER — ALBUTEROL SULFATE (2.5 MG/3ML) 0.083% IN NEBU
5.0000 mg | INHALATION_SOLUTION | Freq: Once | RESPIRATORY_TRACT | Status: AC
  Administered 2018-03-21: 5 mg via RESPIRATORY_TRACT
  Filled 2018-03-21: qty 6

## 2018-03-21 MED ORDER — IPRATROPIUM BROMIDE 0.02 % IN SOLN
0.5000 mg | RESPIRATORY_TRACT | Status: AC
  Administered 2018-03-21: 0.5 mg via RESPIRATORY_TRACT
  Filled 2018-03-21: qty 2.5

## 2018-03-21 MED ORDER — HYDROCODONE-ACETAMINOPHEN 5-325 MG PO TABS
1.0000 | ORAL_TABLET | Freq: Once | ORAL | Status: AC
  Administered 2018-03-21: 1 via ORAL
  Filled 2018-03-21: qty 1

## 2018-03-21 NOTE — ED Provider Notes (Signed)
[facility] EMERGENCY DEPARTMENT Provider Note   CSN: [id number] Arrival date & time: 03/21/18  1744    History   Chief Complaint Chief Complaint  Patient presents with  . Chest Pain    HPI Marc Mayo is a 33 y.o. male.     HPI Patient presents to the emergency department with left-sided chest pain that is been constant for 2 weeks.  The patient states that he also has shoulder and neck pain that radiates down his arm.  Patient states that nothing seems to make the condition better but certain movements palpation make the pain worse.  The patient denies shortness of breath, headache,blurred vision, neck pain, fever, cough, weakness, numbness, dizziness, anorexia, edema, abdominal pain, nausea, vomiting, diarrhea, rash, back pain, dysuria, hematemesis, bloody stool, near syncope, or syncope. Past Medical History:  Diagnosis Date  . ADHD (attention deficit hyperactivity disorder)   . Anxiety   . Bipolar affective disorder ([place])   . Bleeding stomach ulcer    "chronic" (08/09/2017)  . Chronic bronchitis ([place])   . Chronic eustachian tube dysfunction 11/2011  . Chronic lower back pain   . Depression   . Fatty liver   . GERD (gastroesophageal reflux disease)   . Hernia of abdominal wall   . History of kidney stones   . Hypertension   . IBS (irritable bowel syndrome)   . Migraine    "a couple/wk" (08/09/2017)  . OSA (obstructive sleep apnea)    "waiting on new mask; they took one 2 yr ago cause it didn't work right" (08/09/2017)    Patient Active Problem List   Diagnosis Date Noted  . Obstructive sleep apnea on CPAP 08/09/2017  . Suicidal ideation 08/25/2016  . Varicose vein of leg 04/22/2016  . BMI 37.0-37.9, adult 04/22/2016  . Mild episode of recurrent major depressive disorder ([place]) 02/18/2016  . GAD (generalized anxiety disorder) 02/18/2016  . Essential hypertension 01/08/2016  . Cough 06/18/2012  . Dyspnea 05/01/2012  . Fatty liver 04/06/2012  .  Smoker 04/11/2011  . OSA (obstructive sleep apnea) 02/03/2011  . IBS 02/22/2010  . BIPOLAR AFFECTIVE DISORDER 12/08/2009  . GERD 12/08/2009  . FATTY LIVER DISEASE 12/08/2009  . DIARRHEA, CHRONIC 12/08/2009  . ABDOMINAL PAIN 12/08/2009  . CHEST PAIN UNSPECIFIED 03/17/2008    Past Surgical History:  Procedure Laterality Date  . CYSTOSCOPY W/ URETEROSCOPY W/ LITHOTRIPSY    . ESOPHAGOGASTRODUODENOSCOPY  2011   Dr. Oneida Alar: chronic non-H.pylori gastritis, normal small bowel biopsy   . FINGER FRACTURE SURGERY Right 08/10/2015   pins placed in third finger  . FRACTURE SURGERY    . INGUINAL HERNIA REPAIR Bilateral 08/09/2017   open; w/mesh  . INGUINAL HERNIA REPAIR Bilateral 08/09/2017   Procedure: OPEN HERNIA REPAIR INGUINAL ADULT BILATERAL WITH MESH;  Surgeon: Donnie Mesa, MD;  Location: [facility];  Service: General;  Laterality: Bilateral;  GENERAL AND TAP BLOCK  . INSERTION OF MESH Bilateral 08/09/2017   Procedure: INSERTION OF MESH;  Surgeon: Donnie Mesa, MD;  Location: [facility];  Service: General;  Laterality: Bilateral;  GENERAL AND TAP BLOCK  . MYRINGOTOMY WITH TUBE PLACEMENT  12/12/2011   Procedure: MYRINGOTOMY WITH TUBE PLACEMENT;  Surgeon: Jodi Marble, MD;  Location: [place];  Service: ENT;  Laterality: Bilateral;  Bilateral T Tubes  . NASAL ENDOSCOPY WITH EPISTAXIS CONTROL  12/12/2011   Procedure: NASAL ENDOSCOPY WITH EPISTAXIS CONTROL;  Surgeon: Jodi Marble, MD;  Location: [place] [facility];  Service:  ENT;  Laterality: Bilateral;  Direct Nasal Endoscopy with Cautery of Eustachian Tube Tori, Bilateral  . TONSILLECTOMY  2018  . TYMPANOSTOMY TUBE PLACEMENT Bilateral X 7        Home Medications    Prior to Admission medications   Medication Sig Start Date End Date Taking? Authorizing Provider  Acetaminophen-Codeine (TYLENOL/CODEINE #3) 300-30 MG tablet Take 1 tablet by mouth every 4 (four) hours as needed for  pain. 03/21/18   Jaydyn Bozzo, Harrell Gave, PA-C  budesonide-formoterol (SYMBICORT) 160-4.5 MCG/ACT inhaler Inhale 2 puffs into the lungs 2 (two) times daily.    [provider]  carvedilol (COREG) 6.25 MG tablet Take 6.25 mg by mouth 2 (two) times daily with a meal.    [provider]  cyclobenzaprine (FLEXERIL) 10 MG tablet Take 1 tablet (10 mg total) by mouth 3 (three) times daily as needed for muscle spasms. 11/10/17   Charlesetta Shanks, MD  diclofenac (VOLTAREN) 75 MG EC tablet Take 1 tablet (75 mg total) by mouth 2 (two) times daily. 01/12/18   Fawze, Mina A, PA-C  ibuprofen (ADVIL,MOTRIN) 200 MG tablet Take 400-800 mg by mouth every 6 (six) hours as needed for mild pain.     [provider]  omeprazole (PRILOSEC) 20 MG capsule Take 20 mg by mouth daily.     [provider]  oxyCODONE (OXY IR/ROXICODONE) 5 MG immediate release tablet Take 1-2 tablets (5-10 mg total) by mouth every 6 (six) hours as needed for moderate pain. 08/10/17   Donnie Mesa, MD  predniSONE (DELTASONE) 50 MG tablet Take 1 tablet (50 mg total) by mouth daily with breakfast. 03/21/18   Dalia Heading, PA-C    Family History Family History  Problem Relation Age of Onset  . Arthritis Mother   . COPD Mother   . Depression Mother   . Diabetes Mother   . Hyperlipidemia Mother   . Alcohol abuse Father   . Cancer Father   . COPD Father   . Stroke Father   . Early death Brother   . Asthma Daughter   . Colon cancer Neg Hx     Social History Social History   Tobacco Use  . Smoking status: Current Every Day Smoker    Packs/day: 2.00    Years: 17.00    Pack years: 34.00    Types: Cigarettes  . Smokeless tobacco: Former Systems developer    Types: Chew  Substance Use Topics  . Alcohol use: Not Currently  . Drug use: Not Currently    Types: Marijuana     Allergies   Tramadol; Alprazolam; Aripiprazole; Celexa [citalopram hydrobromide]; Ketorolac; Lorazepam; Paxil [paroxetine hcl]; Zoloft  [sertraline hcl]; and Prozac [fluoxetine hcl]   Review of Systems Review of Systems  All other systems negative except as documented in the HPI. All pertinent positives and negatives as reviewed in the HPI. Physical Exam Updated Vital Signs BP (!) 151/103   Pulse 86   Temp 98.6 F (37 C) (Oral)   Resp 18   Ht 6\' 3"  (1.905 m)   Wt 129.3 kg   SpO2 100%   BMI 35.62 kg/m   Physical Exam Vitals signs and nursing note reviewed.  Constitutional:      General: He is not in acute distress.    Appearance: He is well-developed.  HENT:     Head: Normocephalic and atraumatic.  Eyes:     Pupils: Pupils are equal, round, and reactive to light.  Neck:     Musculoskeletal: Normal range of motion  and neck supple.  Cardiovascular:     Rate and Rhythm: Normal rate and regular rhythm.     Heart sounds: Normal heart sounds. No murmur. No friction rub. No gallop.   Pulmonary:     Effort: Pulmonary effort is normal. No respiratory distress.     Breath sounds: Normal breath sounds. No wheezing.  Abdominal:     General: Bowel sounds are normal. There is no distension.     Palpations: Abdomen is soft.     Tenderness: There is no abdominal tenderness.  Musculoskeletal:     Right lower leg: No edema.     Left lower leg: No edema.  Skin:    General: Skin is warm and dry.     Capillary Refill: Capillary refill takes less than 2 seconds.     Findings: No erythema or rash.  Neurological:     Mental Status: He is alert and oriented to person, place, and time.     Motor: No abnormal muscle tone.     Coordination: Coordination normal.  Psychiatric:        Behavior: Behavior normal.      ED Treatments / Results  Labs (all labs ordered are listed, but only abnormal results are displayed) Labs Reviewed  BASIC METABOLIC PANEL  CBC  TROPONIN I    EKG EKG Interpretation  Date/Time:  Wednesday March 21 2018 17:49:20 EST Ventricular Rate:  86 PR Interval:  144 QRS Duration: 88 QT  Interval:  358 QTC Calculation: 428 R Axis:   57 Text Interpretation:  Normal sinus rhythm Normal ECG No significant change since last tracing Confirmed by Gareth Morgan [phone]) on 03/21/2018 7:45:40 PM   Radiology Dg Chest 2 View  Result Date: 03/21/2018 CLINICAL DATA:  33 year old male with history of left-sided chest pain which radiates into the shoulder. EXAM: CHEST - 2 VIEW COMPARISON:  Chest x-ray 08/18/2016. FINDINGS: Lung volumes are normal. No consolidative airspace disease. No pleural effusions. No pneumothorax. No pulmonary nodule or mass noted. Pulmonary vasculature and the cardiomediastinal silhouette are within normal limits. IMPRESSION: No radiographic evidence of acute cardiopulmonary disease. Electronically Signed   By: Vinnie Langton M.D.   On: 03/21/2018 18:39    Procedures Procedures (including critical care time)  Medications Ordered in ED Medications  albuterol (PROVENTIL) (2.5 MG/3ML) 0.083% nebulizer solution 5 mg (5 mg Nebulization Given 03/21/18 2025)  ipratropium (ATROVENT) nebulizer solution 0.5 mg (0.5 mg Nebulization Given 03/21/18 2025)  predniSONE (DELTASONE) tablet 60 mg (60 mg Oral Given 03/21/18 2039)  HYDROcodone-acetaminophen (NORCO/VICODIN) 5-325 MG per tablet 1 tablet (1 tablet Oral Given 03/21/18 2109)     Initial Impression / Assessment and Plan / ED Course  I have reviewed the triage vital signs and the nursing notes.  Pertinent labs & imaging results that were available during my care of the patient were reviewed by me and considered in my medical decision making (see chart for details).        Patient's pain is most likely due to chest wall irritation and neck and shoulder discomfort.  This seems more musculoskeletal based on his examination.  Certain positions when I examined him and palpation make his pain worse.  And is been constant for 2 weeks.  This seems atypical and less likely to be cardiac in nature. Final Clinical Impressions(s) / ED  Diagnoses   Final diagnoses:  Chest wall pain  Bronchitis    ED Discharge Orders         Ordered  predniSONE (DELTASONE) 50 MG tablet  Daily with breakfast     03/21/18 2224    Acetaminophen-Codeine (TYLENOL/CODEINE #3) 300-30 MG tablet  Every 4 hours PRN     03/21/18 2224           Dalia Heading, PA-C 03/22/18 2314    Gareth Morgan, MD 03/23/18 1442

## 2018-03-21 NOTE — ED Notes (Signed)
PT states understanding of care given, follow up care, and medication prescribed. PT ambulated from ED to car with a steady gait. 

## 2018-03-21 NOTE — ED Triage Notes (Signed)
Lateral chest pain for two weeks that radiates to left arm with numbness and tingling to left hand with subjective SOB

## 2018-03-21 NOTE — Discharge Instructions (Addendum)
Return here as needed.  Your testing today did not show any significant abnormality.  Follow-up with your primary doctor.

## 2018-03-22 ENCOUNTER — Telehealth (HOSPITAL_BASED_OUTPATIENT_CLINIC_OR_DEPARTMENT_OTHER): Payer: Self-pay | Admitting: Emergency Medicine

## 2018-03-23 DIAGNOSIS — I10 Essential (primary) hypertension: Secondary | ICD-10-CM | POA: Diagnosis not present

## 2018-03-23 DIAGNOSIS — F330 Major depressive disorder, recurrent, mild: Secondary | ICD-10-CM | POA: Diagnosis not present

## 2018-03-23 DIAGNOSIS — F411 Generalized anxiety disorder: Secondary | ICD-10-CM | POA: Diagnosis not present

## 2018-03-25 DIAGNOSIS — Z79899 Other long term (current) drug therapy: Secondary | ICD-10-CM | POA: Diagnosis not present

## 2018-03-25 DIAGNOSIS — R109 Unspecified abdominal pain: Secondary | ICD-10-CM | POA: Diagnosis not present

## 2018-03-25 DIAGNOSIS — I10 Essential (primary) hypertension: Secondary | ICD-10-CM | POA: Diagnosis not present

## 2018-03-25 DIAGNOSIS — R1030 Lower abdominal pain, unspecified: Secondary | ICD-10-CM | POA: Diagnosis not present

## 2018-03-25 DIAGNOSIS — K219 Gastro-esophageal reflux disease without esophagitis: Secondary | ICD-10-CM | POA: Diagnosis not present

## 2018-03-25 DIAGNOSIS — Z888 Allergy status to other drugs, medicaments and biological substances status: Secondary | ICD-10-CM | POA: Diagnosis not present

## 2018-03-25 DIAGNOSIS — R1031 Right lower quadrant pain: Secondary | ICD-10-CM | POA: Diagnosis not present

## 2018-03-25 DIAGNOSIS — F17210 Nicotine dependence, cigarettes, uncomplicated: Secondary | ICD-10-CM | POA: Diagnosis not present

## 2018-03-25 DIAGNOSIS — M545 Low back pain: Secondary | ICD-10-CM | POA: Diagnosis not present

## 2018-06-12 DIAGNOSIS — Z9889 Other specified postprocedural states: Secondary | ICD-10-CM | POA: Diagnosis not present

## 2018-06-12 DIAGNOSIS — Z8719 Personal history of other diseases of the digestive system: Secondary | ICD-10-CM | POA: Diagnosis not present

## 2018-06-12 DIAGNOSIS — R1032 Left lower quadrant pain: Secondary | ICD-10-CM | POA: Diagnosis not present

## 2018-08-30 DIAGNOSIS — F330 Major depressive disorder, recurrent, mild: Secondary | ICD-10-CM | POA: Diagnosis not present

## 2018-08-30 DIAGNOSIS — R072 Precordial pain: Secondary | ICD-10-CM | POA: Diagnosis not present

## 2018-08-30 DIAGNOSIS — I10 Essential (primary) hypertension: Secondary | ICD-10-CM | POA: Diagnosis not present

## 2018-08-30 DIAGNOSIS — F411 Generalized anxiety disorder: Secondary | ICD-10-CM | POA: Diagnosis not present

## 2018-10-12 DIAGNOSIS — R1032 Left lower quadrant pain: Secondary | ICD-10-CM | POA: Diagnosis not present

## 2018-10-12 DIAGNOSIS — Z9889 Other specified postprocedural states: Secondary | ICD-10-CM | POA: Diagnosis not present

## 2018-10-12 DIAGNOSIS — R1031 Right lower quadrant pain: Secondary | ICD-10-CM | POA: Diagnosis not present

## 2018-10-12 DIAGNOSIS — Z8719 Personal history of other diseases of the digestive system: Secondary | ICD-10-CM | POA: Diagnosis not present

## 2018-11-09 DIAGNOSIS — Z20828 Contact with and (suspected) exposure to other viral communicable diseases: Secondary | ICD-10-CM | POA: Diagnosis not present

## 2018-11-09 DIAGNOSIS — I10 Essential (primary) hypertension: Secondary | ICD-10-CM | POA: Diagnosis not present

## 2019-01-22 ENCOUNTER — Ambulatory Visit: Payer: Medicare Other | Attending: Internal Medicine

## 2019-01-22 ENCOUNTER — Other Ambulatory Visit: Payer: Self-pay

## 2019-01-22 DIAGNOSIS — Z20822 Contact with and (suspected) exposure to covid-19: Secondary | ICD-10-CM

## 2019-01-24 ENCOUNTER — Telehealth: Payer: Self-pay

## 2019-01-24 NOTE — Telephone Encounter (Signed)
Patient called for COVID-19 test result. He was informed that his test was still active and had not resulted.  He states he will all back.

## 2019-01-24 NOTE — Telephone Encounter (Signed)
Patient called for his COVID-19 test result done 01/22/19. He was informed that test was still active and we have no result. He verbalized understanding and will call back later.

## 2019-01-25 ENCOUNTER — Ambulatory Visit: Payer: Self-pay

## 2019-01-25 LAB — NOVEL CORONAVIRUS, NAA: SARS-CoV-2, NAA: NOT DETECTED

## 2019-01-25 NOTE — Telephone Encounter (Signed)
Provided covid test  Results.  Patient voices understanding.

## 2019-03-28 ENCOUNTER — Emergency Department (HOSPITAL_BASED_OUTPATIENT_CLINIC_OR_DEPARTMENT_OTHER): Payer: Medicare Other

## 2019-03-28 ENCOUNTER — Other Ambulatory Visit: Payer: Self-pay

## 2019-03-28 ENCOUNTER — Encounter (HOSPITAL_BASED_OUTPATIENT_CLINIC_OR_DEPARTMENT_OTHER): Payer: Self-pay

## 2019-03-28 ENCOUNTER — Emergency Department (HOSPITAL_BASED_OUTPATIENT_CLINIC_OR_DEPARTMENT_OTHER)
Admission: EM | Admit: 2019-03-28 | Discharge: 2019-03-28 | Disposition: A | Discharge status: Home / Self Care | Payer: Medicare Other | Attending: Emergency Medicine | Admitting: Emergency Medicine

## 2019-03-28 DIAGNOSIS — Z79899 Other long term (current) drug therapy: Secondary | ICD-10-CM | POA: Diagnosis not present

## 2019-03-28 DIAGNOSIS — I10 Essential (primary) hypertension: Secondary | ICD-10-CM | POA: Diagnosis not present

## 2019-03-28 DIAGNOSIS — M79651 Pain in right thigh: Secondary | ICD-10-CM | POA: Diagnosis not present

## 2019-03-28 DIAGNOSIS — Z888 Allergy status to other drugs, medicaments and biological substances status: Secondary | ICD-10-CM | POA: Diagnosis not present

## 2019-03-28 DIAGNOSIS — Z885 Allergy status to narcotic agent status: Secondary | ICD-10-CM | POA: Insufficient documentation

## 2019-03-28 DIAGNOSIS — F17210 Nicotine dependence, cigarettes, uncomplicated: Secondary | ICD-10-CM | POA: Diagnosis not present

## 2019-03-28 DIAGNOSIS — M79604 Pain in right leg: Secondary | ICD-10-CM | POA: Diagnosis present

## 2019-03-28 DIAGNOSIS — M79659 Pain in unspecified thigh: Secondary | ICD-10-CM

## 2019-03-28 MED ORDER — OXYCODONE-ACETAMINOPHEN 5-325 MG PO TABS
1.0000 | ORAL_TABLET | Freq: Once | ORAL | Status: AC
  Administered 2019-03-28: 1 via ORAL
  Filled 2019-03-28: qty 1

## 2019-03-28 NOTE — Discharge Instructions (Addendum)
You were seen in the emergency department for posterior thigh pain.  You had an x-ray and ultrasound that did not show an obvious explanation of your pain. Radiology recommended a MRI with and without contrast to better evaluate. Please continue ibuprofen 3 times a day as needed for pain.  Please schedule follow-up appointment with your primary care doctor and consider an orthopedics referral.  I am including the impression from the radiologist regarding the ultrasound -  IMPRESSION:  Ill-defined lesion in the region of concern cannot be characterized.  Nonemergent MRI with and without contrast is the best test for  further evaluation.

## 2019-03-28 NOTE — ED Triage Notes (Signed)
Pt c/o "knot on the back pf my leg"-right LE-states area has been there x 2 years after a fall-denies recent injury-states pain was so great he had to leave work today-NAD-limping gait

## 2019-03-28 NOTE — ED Provider Notes (Signed)
Marc Mayo Provider Note   CSN: [id number] Arrival date & time: 03/28/19  1306     History Chief Complaint  Patient presents with  . Leg Problem    Marc Mayo is a 34 y.o. male.  He is presenting from work complaining of severe posterior right thigh pain.  He says it there is a knot on the back of his leg posterior right thigh that has been there for a couple years and causes him constant pain.  Worse with ambulation.  Pain worsened today so badly that he needed to leave work.  No fevers or chills.  He said there was a fall a couple years ago and has been there since then but he never got it evaluated.  No fevers or chills chest pain or shortness of breath.  The history is provided by the patient.  Leg Pain Location:  Leg Injury: yes   Mechanism of injury: fall   Leg location:  L upper leg Pain details:    Quality:  Aching   Radiates to:  L leg   Severity:  Moderate   Onset quality:  Gradual   Duration: 2 years.   Timing:  Intermittent   Progression:  Waxing and waning Dislocation: no   Foreign body present:  No foreign bodies Relieved by:  Nothing Worsened by:  Activity Ineffective treatments:  NSAIDs Associated symptoms: back pain (chronic)   Associated symptoms: no fever, no neck pain, no numbness and no tingling        Past Medical History:  Diagnosis Date  . ADHD (attention deficit hyperactivity disorder)   . Anxiety   . Bipolar affective disorder ([place])   . Bleeding stomach ulcer    "chronic" (08/09/2017)  . Chronic bronchitis ([place])   . Chronic eustachian tube dysfunction 11/2011  . Chronic lower back pain   . Depression   . Fatty liver   . GERD (gastroesophageal reflux disease)   . Hernia of abdominal wall   . History of kidney stones   . Hypertension   . IBS (irritable bowel syndrome)   . Migraine    "a couple/wk" (08/09/2017)  . OSA (obstructive sleep apnea)    "waiting on new mask; they took one 2 yr ago cause it  didn't work right" (08/09/2017)    Patient Active Problem List   Diagnosis Date Noted  . Obstructive sleep apnea on CPAP 08/09/2017  . Suicidal ideation 08/25/2016  . Varicose vein of leg 04/22/2016  . BMI 37.0-37.9, adult 04/22/2016  . Mild episode of recurrent major depressive disorder ([place]) 02/18/2016  . GAD (generalized anxiety disorder) 02/18/2016  . Essential hypertension 01/08/2016  . Cough 06/18/2012  . Dyspnea 05/01/2012  . Fatty liver 04/06/2012  . Smoker 04/11/2011  . OSA (obstructive sleep apnea) 02/03/2011  . IBS 02/22/2010  . BIPOLAR AFFECTIVE DISORDER 12/08/2009  . GERD 12/08/2009  . FATTY LIVER DISEASE 12/08/2009  . DIARRHEA, CHRONIC 12/08/2009  . ABDOMINAL PAIN 12/08/2009  . CHEST PAIN UNSPECIFIED 03/17/2008    Past Surgical History:  Procedure Laterality Date  . CYSTOSCOPY W/ URETEROSCOPY W/ LITHOTRIPSY    . ESOPHAGOGASTRODUODENOSCOPY  2011   Dr. Oneida Alar: chronic non-H.pylori gastritis, normal small bowel biopsy   . FINGER FRACTURE SURGERY Right 08/10/2015   pins placed in third finger  . FRACTURE SURGERY    . INGUINAL HERNIA REPAIR Bilateral 08/09/2017   open; w/mesh  . INGUINAL HERNIA REPAIR Bilateral 08/09/2017   Procedure: OPEN HERNIA REPAIR INGUINAL ADULT BILATERAL WITH  MESH;  Surgeon: Donnie Mesa, MD;  Location: [facility];  Service: General;  Laterality: Bilateral;  GENERAL AND TAP BLOCK  . INSERTION OF MESH Bilateral 08/09/2017   Procedure: INSERTION OF MESH;  Surgeon: Donnie Mesa, MD;  Location: [facility];  Service: General;  Laterality: Bilateral;  GENERAL AND TAP BLOCK  . MYRINGOTOMY WITH TUBE PLACEMENT  12/12/2011   Procedure: MYRINGOTOMY WITH TUBE PLACEMENT;  Surgeon: Jodi Marble, MD;  Location: [facility];  Service: ENT;  Laterality: Bilateral;  Bilateral T Tubes  . NASAL ENDOSCOPY WITH EPISTAXIS CONTROL  12/12/2011   Procedure: NASAL ENDOSCOPY WITH EPISTAXIS CONTROL;  Surgeon: Jodi Marble,  MD;  Location: [facility];  Service: ENT;  Laterality: Bilateral;  Direct Nasal Endoscopy with Cautery of Eustachian Tube Tori, Bilateral  . TONSILLECTOMY  2018  . TYMPANOSTOMY TUBE PLACEMENT Bilateral X 7       Family History  Problem Relation Age of Onset  . Arthritis Mother   . COPD Mother   . Depression Mother   . Diabetes Mother   . Hyperlipidemia Mother   . Alcohol abuse Father   . Cancer Father   . COPD Father   . Stroke Father   . Early death Brother   . Asthma Daughter   . Colon cancer Neg Hx     Social History   Tobacco Use  . Smoking status: Current Every Day Smoker    Packs/day: 2.00    Years: 17.00    Pack years: 34.00    Types: Cigarettes  . Smokeless tobacco: Former Systems developer    Types: Chew  Substance Use Topics  . Alcohol use: Not Currently  . Drug use: Not Currently    Home Medications Prior to Admission medications   Medication Sig Start Date End Date Taking? Authorizing Provider  escitalopram (LEXAPRO) 20 MG tablet TAKE 1 AND 1/2 TABLETS DAILY BY MOUTH 12/07/18  Yes [provider]  ibuprofen (ADVIL,MOTRIN) 200 MG tablet Take 400-800 mg by mouth every 6 (six) hours as needed for mild pain.    Yes [provider]  lisinopril (ZESTRIL) 10 MG tablet TAKE ONE TABLET (10 MG DOSE) BY MOUTH DAILY. 08/30/18  Yes [provider]  Acetaminophen-Codeine (TYLENOL/CODEINE #3) 300-30 MG tablet Take 1 tablet by mouth every 4 (four) hours as needed for pain. 03/21/18   Lawyer, Harrell Gave, PA-C  budesonide-formoterol (SYMBICORT) 160-4.5 MCG/ACT inhaler Inhale 2 puffs into the lungs 2 (two) times daily.    [provider]  busPIRone (BUSPAR) 10 MG tablet Take 10 mg by mouth 2 (two) times daily. 03/18/19   [provider]  carvedilol (COREG) 6.25 MG tablet Take 6.25 mg by mouth 2 (two) times daily with a meal.    [provider]  cyclobenzaprine (FLEXERIL) 10 MG tablet Take 1 tablet (10 mg total) by mouth 3  (three) times daily as needed for muscle spasms. 11/10/17   Charlesetta Shanks, MD  diclofenac (VOLTAREN) 75 MG EC tablet Take 1 tablet (75 mg total) by mouth 2 (two) times daily. 01/12/18   Fawze, Mina A, PA-C  omeprazole (PRILOSEC) 20 MG capsule Take 20 mg by mouth daily.     [provider]  oxyCODONE (OXY IR/ROXICODONE) 5 MG immediate release tablet Take 1-2 tablets (5-10 mg total) by mouth every 6 (six) hours as needed for moderate pain. 08/10/17   Donnie Mesa, MD  predniSONE (DELTASONE) 50 MG tablet Take 1 tablet (50 mg total) by mouth daily with  breakfast. 03/21/18   Lawyer, Harrell Gave, PA-C    Allergies    Tramadol, Alprazolam, Aripiprazole, Celexa [citalopram hydrobromide], Ketorolac, Lorazepam, Paxil [paroxetine hcl], Zoloft [sertraline hcl], and Prozac [fluoxetine hcl]  Review of Systems   Review of Systems  Constitutional: Negative for fever.  HENT: Negative for sore throat.   Eyes: Negative for visual disturbance.  Respiratory: Negative for shortness of breath.   Cardiovascular: Negative for chest pain.  Gastrointestinal: Negative for abdominal pain.  Genitourinary: Negative for dysuria.  Musculoskeletal: Positive for back pain (chronic). Negative for neck pain.  Skin: Negative for rash.  Neurological: Negative for headaches.    Physical Exam Updated Vital Signs BP (!) 152/96 (BP Location: Left Arm)   Pulse 93   Temp 98.2 F (36.8 C) (Oral)   Resp 16   Ht 6\' 3"  (1.905 m)   Wt 129.9 kg   SpO2 100%   BMI 35.80 kg/m   Physical Exam Vitals and nursing note reviewed.  Constitutional:      Appearance: He is well-developed.  HENT:     Head: Normocephalic and atraumatic.  Eyes:     Conjunctiva/sclera: Conjunctivae normal.  Cardiovascular:     Rate and Rhythm: Normal rate and regular rhythm.     Heart sounds: No murmur.  Pulmonary:     Effort: Pulmonary effort is normal. No respiratory distress.     Breath sounds: Normal breath sounds.  Abdominal:      Palpations: Abdomen is soft.     Tenderness: There is no abdominal tenderness.  Musculoskeletal:        General: Swelling and tenderness present. Normal range of motion.     Cervical back: Neck supple.       Legs:  Skin:    General: Skin is warm and dry.     Capillary Refill: Capillary refill takes less than 2 seconds.  Neurological:     General: No focal deficit present.     Mental Status: He is alert.     Sensory: No sensory deficit.     Motor: No weakness.     ED Results / Procedures / Treatments   Labs (all labs ordered are listed, but only abnormal results are displayed) Labs Reviewed - No data to display  EKG None  Radiology [place] RT LOWER EXTREM LTD SOFT TISSUE NON VASCULAR  Result Date: 03/28/2019 CLINICAL DATA:  Palpable lesion on the posterior right thigh for 2 years. EXAM: ULTRASOUND RIGHT LOWER EXTREMITY LIMITED TECHNIQUE: Ultrasound examination of the lower extremity soft tissues was performed in the area of clinical concern. COMPARISON:  Plain films right upper leg today. FINDINGS: There appears to be a lesion measuring approximately 6.6 x 5.3 x 4.3 cm in the region of concern. The lesion is heterogeneous. There is some flow within the lesion on Doppler imaging. IMPRESSION: Ill-defined lesion in the region of concern cannot be characterized. Nonemergent MRI with and without contrast is the best test for further evaluation. Electronically Signed   By: Inge Rise M.D.   On: 03/28/2019 14:58   DG Femur Min 2 Views Right  Result Date: 03/28/2019 CLINICAL DATA:  Leg pain status post fall 2 years ago. No recent injury. EXAM: RIGHT FEMUR 2 VIEWS COMPARISON:  Plain film of the RIGHT femur dated 10/12/2012. FINDINGS: There is no evidence of fracture. No acute or suspicious osseous finding. Soft tissues are unremarkable. IMPRESSION: Negative. Electronically Signed   By: Franki Cabot M.D.   On: 03/28/2019 14:45    Procedures Procedures (including critical  care  time)  Medications Ordered in ED Medications  oxyCODONE-acetaminophen (PERCOCET/ROXICET) 5-325 MG per tablet 1 tablet (1 tablet Oral Given 03/28/19 1444)    ED Course  I have reviewed the triage vital signs and the nursing notes.  Pertinent labs & imaging results that were available during my care of the patient were reviewed by me and considered in my medical decision making (see chart for details).  Clinical Course as of Mar 27 1657  Thu Mar 27, 5924  [id number] 34 year old male with 2 years of posterior right thigh pain.  On exam.  No signs of cellulitis.  Question hamstring tear, Baker's cyst although too high.  Getting x-ray although doubt bony but possibly some calcification there.  Soft tissue ultrasound.   [MB]  [id number] Femur x-ray interpreted by me as no gross fracture or dislocation.   [MB]  45 Radiology recommends a nonemergent MRI with and without contrast for further evaluation.  Patient has a primary care doctor so I reviewed that with him and he will follow up with him.   [MB]    Clinical Course User Index [MB] Hayden Rasmussen, MD   MDM Rules/Calculators/A&P                       Final Clinical Impression(s) / ED Diagnoses Final diagnoses:  Thigh pain    Rx / DC Orders ED Discharge Orders    None       Hayden Rasmussen, MD 03/28/19 1700

## 2019-12-28 ENCOUNTER — Emergency Department (HOSPITAL_COMMUNITY)
Admission: EM | Admit: 2019-12-28 | Discharge: 2019-12-28 | Disposition: A | Discharge status: Home / Self Care | Payer: Medicare Other | Attending: Emergency Medicine | Admitting: Emergency Medicine

## 2019-12-28 ENCOUNTER — Emergency Department (HOSPITAL_COMMUNITY): Payer: Medicare Other

## 2019-12-28 ENCOUNTER — Other Ambulatory Visit: Payer: Self-pay

## 2019-12-28 DIAGNOSIS — M79641 Pain in right hand: Secondary | ICD-10-CM

## 2019-12-28 DIAGNOSIS — Z79899 Other long term (current) drug therapy: Secondary | ICD-10-CM | POA: Insufficient documentation

## 2019-12-28 DIAGNOSIS — F17210 Nicotine dependence, cigarettes, uncomplicated: Secondary | ICD-10-CM | POA: Diagnosis not present

## 2019-12-28 DIAGNOSIS — K219 Gastro-esophageal reflux disease without esophagitis: Secondary | ICD-10-CM | POA: Insufficient documentation

## 2019-12-28 DIAGNOSIS — S3991XA Unspecified injury of abdomen, initial encounter: Secondary | ICD-10-CM | POA: Diagnosis present

## 2019-12-28 DIAGNOSIS — S30811A Abrasion of abdominal wall, initial encounter: Secondary | ICD-10-CM | POA: Insufficient documentation

## 2019-12-28 DIAGNOSIS — Y92410 Unspecified street and highway as the place of occurrence of the external cause: Secondary | ICD-10-CM | POA: Insufficient documentation

## 2019-12-28 DIAGNOSIS — Z7951 Long term (current) use of inhaled steroids: Secondary | ICD-10-CM | POA: Diagnosis not present

## 2019-12-28 DIAGNOSIS — R109 Unspecified abdominal pain: Secondary | ICD-10-CM

## 2019-12-28 DIAGNOSIS — I10 Essential (primary) hypertension: Secondary | ICD-10-CM | POA: Diagnosis not present

## 2019-12-28 LAB — URINALYSIS, ROUTINE W REFLEX MICROSCOPIC
Bilirubin Urine: NEGATIVE
Glucose, UA: NEGATIVE mg/dL
Hgb urine dipstick: NEGATIVE
Ketones, ur: NEGATIVE mg/dL
Leukocytes,Ua: NEGATIVE
Nitrite: NEGATIVE
Protein, ur: NEGATIVE mg/dL
Specific Gravity, Urine: 1.01 (ref 1.005–1.030)
pH: 6 (ref 5.0–8.0)

## 2019-12-28 LAB — CBC
HCT: 46.8 % (ref 39.0–52.0)
Hemoglobin: 16.7 g/dL (ref 13.0–17.0)
MCH: 32.1 pg (ref 26.0–34.0)
MCHC: 35.7 g/dL (ref 30.0–36.0)
MCV: 89.8 fL (ref 80.0–100.0)
Platelets: 241 10*3/uL (ref 150–400)
RBC: 5.21 MIL/uL (ref 4.22–5.81)
RDW: 12.8 % (ref 11.5–15.5)
WBC: 10.5 10*3/uL (ref 4.0–10.5)
nRBC: 0 % (ref 0.0–0.2)

## 2019-12-28 LAB — COMPREHENSIVE METABOLIC PANEL WITH GFR
ALT: 32 U/L (ref 0–44)
AST: 30 U/L (ref 15–41)
Albumin: 3.6 g/dL (ref 3.5–5.0)
Alkaline Phosphatase: 56 U/L (ref 38–126)
Anion gap: 10 (ref 5–15)
BUN: <5 mg/dL — ABNORMAL LOW (ref 6–20)
CO2: 22 mmol/L (ref 22–32)
Calcium: 8.7 mg/dL — ABNORMAL LOW (ref 8.9–10.3)
Chloride: 107 mmol/L (ref 98–111)
Creatinine, Ser: 0.71 mg/dL (ref 0.61–1.24)
GFR, Estimated: >60 mL/min
Glucose, Bld: 99 mg/dL (ref 70–99)
Potassium: 3.9 mmol/L (ref 3.5–5.1)
Sodium: 139 mmol/L (ref 135–145)
Total Bilirubin: 0.8 mg/dL (ref 0.3–1.2)
Total Protein: 6 g/dL — ABNORMAL LOW (ref 6.5–8.1)

## 2019-12-28 LAB — LIPASE, BLOOD: Lipase: 39 U/L (ref 11–51)

## 2019-12-28 MED ORDER — IOHEXOL 300 MG/ML  SOLN
100.0000 mL | Freq: Once | INTRAMUSCULAR | Status: AC | PRN
  Administered 2019-12-28: 100 mL via INTRAVENOUS

## 2019-12-28 MED ORDER — ONDANSETRON HCL 4 MG/2ML IJ SOLN
4.0000 mg | Freq: Once | INTRAMUSCULAR | Status: AC
  Administered 2019-12-28: 23:00:00 4 mg via INTRAVENOUS
  Filled 2019-12-28: qty 2

## 2019-12-28 MED ORDER — FENTANYL CITRATE (PF) 100 MCG/2ML IJ SOLN
50.0000 ug | Freq: Once | INTRAMUSCULAR | Status: AC
  Administered 2019-12-28: 23:00:00 50 ug via INTRAVENOUS
  Filled 2019-12-28: qty 2

## 2019-12-28 NOTE — Discharge Instructions (Addendum)

## 2019-12-28 NOTE — ED Notes (Signed)
Patient transported to CT scan . 
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
## 2020-03-30 ENCOUNTER — Other Ambulatory Visit: Payer: Self-pay | Admitting: Nurse Practitioner

## 2020-03-30 MED ORDER — ALBENDAZOLE 200 MG PO TABS
ORAL_TABLET | ORAL | 0 refills | Status: DC
Start: 2020-03-30 — End: 2023-10-16

## 2023-08-28 ENCOUNTER — Encounter: Payer: Self-pay | Admitting: Gastroenterology

## 2023-10-15 NOTE — Progress Notes (Unsigned)
 Marc Mayo [id number] March 15, 1985   Chief Complaint: Abdominal pain  Referring Provider: Nena Rosina CROME, PA-C Primary GI MD: Sampson (previous Dr. Salomon, [facility])  HPI: Marc Mayo is a 38 y.o. male with past medical history of anxiety/depression, ADHD, bipolar affective disorder, stomach ulcer, fatty liver, HTN, IBS, OSA, smoking who presents today for evaluation of right lower quadrant abdominal pain.    Patient seen in the ED 08/26/2023 for complaint of bloody stools over the previous 2 weeks.  Bowel movements otherwise normal.  Endorsed generalized abdominal pain without vomiting.  Reported history of ulcers and endorsed taking 1 ibuprofen  or Goody powder per day.  No frequent alcohol use and no blood thinner use.  Physical exam notable for mild generalized tenderness to palpation of the abdomen.  Hemoccult was trace positive, no overt melena or BRBPR, no obvious hemorrhoids or surrounding rashes.  Labs notable for mildly elevated lipase to 27, otherwise unremarkable, no significant leukocytosis, stable hemoglobin and hematocrit.  CT without any acute findings, but does note chronic findings such as bilateral inguinal hernias, small area of fat deposition in the sigmoid colon suggestive of mild chronic inflammatory sequela, diverticulosis.  GI follow-up was recommended.  Advised to avoid NSAIDs and alcohol as well as Goody powders.   Patient here today with his wife Marc Mayo.  States he continues to see blood in the toilet about once a week.  Back in August was seeing blood with bowel movements daily.  Denies hemorrhoids.  States nothing was found on exam in the ED.  Occasionally may have some rectal discomfort with bowel movements but not always.  He does see black stools on occasion as well, and the last time he noticed any black stools was about 3 weeks ago.  Has a bowel movement once or twice a day and denies any constipation or diarrhea.  Continues to have intermittent  right sided abdominal pain.  No clear association with bowel movements or with eating.  Pain comes and goes.  Sharp in nature, may last 10 minutes, and resolves on its own.  States he has had this chronic abdominal pain for a while.  Still has his gallbladder.  Reports history of fatty liver.  States he has not been seen by PCP in about 5 years.  Denies prior colonoscopy.  Denies any family history of colon cancer, colon polyps, or inflammatory bowel disease.  Reports history of acid reflux which has improved over the years.  Tends to be triggered by eating spicy foods, but usually does not have any problems with this.  Denies any dysphagia.  States he normally has a good appetite, though has not eaten in about 3 days as he has not had any appetite at all.  Denies any nausea or vomiting.  He and his wife state that his daughter was recently diagnosed with H. pylori in July.  Has been treated with antibiotics.  He and his wife have not been tested for this.  He currently smokes cigarettes.  Reports occasional alcohol use but denies daily use or binge drinking.  He is not on any medications at this time, prescription or OTC.  Denies history of MI/stroke.  Reports he has chronic bronchitis and sleep apnea, is not on a CPAP though he has tried this before.  Denies chest pain or shortness of breath.  He reports unintentional weight loss of 20 to 30 pounds in the last year.  Per chart review weight 271 lb on 04/2022, and today weighs  256 lb.  Previous GI Procedures/Imaging   CT A/P 08/26/2023 1. No acute intra-abdominal or pelvic pathology.  2. Bilateral fat-containing inguinal hernias.  3. Few colonic diverticuli without CT evidence of diverticulitis.  4. Subtle intramural fat deposition within the wall of the sigmoid colon is nonspecific but suggests mild chronic inflammatory sequelae. No acute inflammatory stranding is evident.   Prior EGD 12/23/2009 for abdominal pain and reflux, dx gastritis   Past  Medical History:  Diagnosis Date   ADHD (attention deficit hyperactivity disorder)    Anxiety    Bipolar affective disorder ([facility])    Bleeding stomach ulcer    chronic (08/09/2017)   Chronic bronchitis ([facility])    Chronic eustachian tube dysfunction 11/2011   Chronic lower back pain    Depression    Fatty liver    GERD (gastroesophageal reflux disease)    Hernia of abdominal wall    History of kidney stones    Hypertension    IBS (irritable bowel syndrome)    Migraine    a couple/wk (08/09/2017)   OSA (obstructive sleep apnea)    waiting on new mask; they took one 2 yr ago cause it didn't work right (08/09/2017)    Past Surgical History:  Procedure Laterality Date   CYSTOSCOPY W/ URETEROSCOPY W/ LITHOTRIPSY     ESOPHAGOGASTRODUODENOSCOPY  2011   Dr. Harvey: chronic non-H.pylori gastritis, normal small bowel biopsy    FINGER FRACTURE SURGERY Right 08/10/2015   pins placed in third finger   FRACTURE SURGERY     INGUINAL HERNIA REPAIR Bilateral 08/09/2017   open; w/mesh   INGUINAL HERNIA REPAIR Bilateral 08/09/2017   Procedure: OPEN HERNIA REPAIR INGUINAL ADULT BILATERAL WITH MESH;  Surgeon: Belinda Cough, MD;  Location: [facility];  Service: General;  Laterality: Bilateral;  GENERAL AND TAP BLOCK   INSERTION OF MESH Bilateral 08/09/2017   Procedure: INSERTION OF MESH;  Surgeon: Belinda Cough, MD;  Location: [facility];  Service: General;  Laterality: Bilateral;  GENERAL AND TAP BLOCK   MYRINGOTOMY WITH TUBE PLACEMENT  12/12/2011   Procedure: MYRINGOTOMY WITH TUBE PLACEMENT;  Surgeon: Marlyce Finer, MD;  Location: [facility];  Service: ENT;  Laterality: Bilateral;  Bilateral T Tubes   NASAL ENDOSCOPY WITH EPISTAXIS CONTROL  12/12/2011   Procedure: NASAL ENDOSCOPY WITH EPISTAXIS CONTROL;  Surgeon: Marlyce Finer, MD;  Location: [facility];  Service: ENT;  Laterality: Bilateral;  Direct Nasal Endoscopy with Cautery of  Eustachian Tube Tori, Bilateral   TONSILLECTOMY  2018   TYMPANOSTOMY TUBE PLACEMENT Bilateral X 7    No current outpatient medications on file.   No current facility-administered medications for this visit.    Allergies as of 10/16/2023 - Review Complete 10/16/2023  Allergen Reaction Noted   Tramadol Itching 07/27/2012   Alprazolam Other (See Comments) 12/06/2011   Aripiprazole Hives 09/03/2012   Celexa [citalopram hydrobromide] Rash and Other (See Comments) 07/27/2012   Ketorolac Hives and Other (See Comments) 04/10/2013   Lorazepam Other (See Comments) 03/17/2008   Paxil [paroxetine hcl] Other (See Comments) 10/13/2010   Zoloft [sertraline hcl] Other (See Comments) 09/03/2012   Prozac [fluoxetine hcl] Other (See Comments) 10/13/2010    Family History  Problem Relation Age of Onset   Arthritis Mother    COPD Mother    Depression Mother    Diabetes Mother    Hyperlipidemia Mother    Alcohol abuse Father    Cancer Father  cell   COPD Father    Stroke Father    Early death Brother    Asthma Daughter    Colon cancer Neg Hx     Social History   Tobacco Use   Smoking status: Every Day    Current packs/day: 2.00    Average packs/day: 2.0 packs/day for 17.0 years (34.0 ttl pk-yrs)    Types: Cigarettes   Smokeless tobacco: Former    Types: Engineer, drilling   Vaping status: Never Used  Substance Use Topics   Alcohol use: Yes    Comment: occ   Drug use: Not Currently     Review of Systems:    Constitutional: Unintentional weight loss.  No fever, chills  Cardiovascular: No chest pain Respiratory: No SOB or cough Gastrointestinal: See HPI and otherwise negative Hematologic: Intermittent rectal bleeding   Physical Exam:  Vital signs: BP (!) 150/90   Pulse 85   Ht 6' 3 (1.905 m)   Wt 256 lb (116.1 kg)   BMI 32.00 kg/m   Constitutional: Pleasant, overweight male in NAD, alert and cooperative Head:  Normocephalic and atraumatic.  Eyes: No scleral  icterus.  Respiratory: Respirations even and unlabored. Lungs clear to auscultation bilaterally.  No wheezes, crackles, or rhonchi.  Cardiovascular:  Regular rate and rhythm. No murmurs. No peripheral edema. Gastrointestinal:  Soft, nondistended, tender to palpation of RLQ. No rebound or guarding. Normal bowel sounds. No appreciable masses or hepatomegaly. Rectal:  Deferred to colonoscopy Neurologic:  Alert and oriented x4;  grossly normal neurologically.  Skin:   Dry and intact without significant lesions or rashes. Psychiatric: Oriented to person, place and time. Demonstrates good judgement and reason without abnormal affect or behaviors.   RELEVANT LABS AND IMAGING: CBC    Component Value Date/Time   WBC 10.5 12/28/2019 1442   RBC 5.21 12/28/2019 1442   HGB 16.7 12/28/2019 1442   HCT 46.8 12/28/2019 1442   PLT 241 12/28/2019 1442   MCV 89.8 12/28/2019 1442   MCH 32.1 12/28/2019 1442   MCHC 35.7 12/28/2019 1442   RDW 12.8 12/28/2019 1442   LYMPHSABS 3.0 10/13/2017 1134   MONOABS 0.5 10/13/2017 1134   EOSABS 0.5 10/13/2017 1134   BASOSABS 0.1 10/13/2017 1134    CMP     Component Value Date/Time   NA 139 12/28/2019 1442   NA 140 04/22/2016 0854   K 3.9 12/28/2019 1442   CL 107 12/28/2019 1442   CO2 22 12/28/2019 1442   GLUCOSE 99 12/28/2019 1442   BUN <5 (L) 12/28/2019 1442   BUN 6 04/22/2016 0854   CREATININE 0.71 12/28/2019 1442   CALCIUM 8.7 (L) 12/28/2019 1442   PROT 6.0 (L) 12/28/2019 1442   ALBUMIN 3.6 12/28/2019 1442   AST 30 12/28/2019 1442   ALT 32 12/28/2019 1442   ALKPHOS 56 12/28/2019 1442   BILITOT 0.8 12/28/2019 1442   GFRNONAA >60 12/28/2019 1442   GFRAA >60 03/21/2018 1807     Assessment/Plan:   RLQ abdominal pain RUQ abdominal pain Rectal bleeding Abnormal finding on imaging of the GI tract Unintentional weight loss History of gastritis Dark stools Patient here today with multiple GI complaints.  Has recently been having intermittent  rectal bleeding.  Seen in the ED 08/26/2023 for this as well as generalized abdominal pain.  Hemoccult in the ED faintly positive with no overt melena or BRBPR, hemorrhoids or rashes.  CT showed subtle intramural fat deposition within the wall of the sigmoid colon suggestive of  mild chronic inflammatory sequelae, otherwise unremarkable. He continues to have intermittent rectal bleeding as well as occasional rectal pain with bowel movements.  Has a bowel movement 1-2 times daily, denies any diarrhea or constipation.  Has noticed intermittent black stools, last time he saw this was 3 weeks ago.  Does report history of stomach ulcers and has previously used NSAIDs though is not taking any medications prescription or OTC at this time. Prior EGD in 2011 with finding of gastritis.  No prior colonoscopy. Reports having history of acid reflux but not currently having any problems with this.  Does endorse a decreased appetite and unintentional weight loss, and per chart review has lost about 15 pounds in the last year.  - Schedule colonoscopy for evaluation of rectal pain and bleeding. Add EGD for reported melena, decreased appetite, unintentional weight loss, history of gastritis.  I thoroughly discussed the procedure with the patient to include nature of the procedure, alternatives, benefits, and risks (including but not limited to bleeding, infection, perforation, anesthesia/cardiac/pulmonary complications). Patient verbalized understanding and gave verbal consent to proceed with procedure.  - Labs today: CBC, CMP, lipase, hepatitis panel - Avoid NSAIDs - Start Omeprazole  40 mg - RUQ US  to evaluate for gallstones - Encouraged patient to establish primary care  Hepatic steatosis Patient with history of fatty liver.  Seen for this in 2014 per chart review.  Has recently had normal liver enzymes and liver appeared normal on recent CT scan. FIB-4 score of 0.5, advanced fibrosis excluded.  - Monitor CBC, CMP  every 6 months - Abstain from all alcohol including beer, wine, liquor, and non-alcoholic beer.  - Work to maintain a healthy weight through portion control and exercise  - Maximize control of any hyperglycemia and hyperlipidemia    Camie Furbish, PA-C [facility] 10/16/2023, 9:54 AM  Patient Care Team: Nena Rosina LITTIE DEVONNA as PCP - General (Physician Assistant) Harvey Margo LITTIE, MD (Inactive) as Attending Physician (Gastroenterology)

## 2023-10-16 ENCOUNTER — Other Ambulatory Visit

## 2023-10-16 ENCOUNTER — Encounter: Payer: Self-pay | Admitting: Gastroenterology

## 2023-10-16 ENCOUNTER — Ambulatory Visit: Admitting: Gastroenterology

## 2023-10-16 VITALS — BP 150/90 | HR 85 | Ht 75.0 in | Wt 256.0 lb

## 2023-10-16 DIAGNOSIS — K625 Hemorrhage of anus and rectum: Secondary | ICD-10-CM | POA: Diagnosis not present

## 2023-10-16 DIAGNOSIS — R1011 Right upper quadrant pain: Secondary | ICD-10-CM

## 2023-10-16 DIAGNOSIS — R1031 Right lower quadrant pain: Secondary | ICD-10-CM | POA: Diagnosis not present

## 2023-10-16 DIAGNOSIS — R195 Other fecal abnormalities: Secondary | ICD-10-CM

## 2023-10-16 DIAGNOSIS — R1084 Generalized abdominal pain: Secondary | ICD-10-CM

## 2023-10-16 DIAGNOSIS — Z8719 Personal history of other diseases of the digestive system: Secondary | ICD-10-CM

## 2023-10-16 DIAGNOSIS — R933 Abnormal findings on diagnostic imaging of other parts of digestive tract: Secondary | ICD-10-CM | POA: Diagnosis not present

## 2023-10-16 DIAGNOSIS — R634 Abnormal weight loss: Secondary | ICD-10-CM | POA: Diagnosis not present

## 2023-10-16 DIAGNOSIS — K760 Fatty (change of) liver, not elsewhere classified: Secondary | ICD-10-CM

## 2023-10-16 LAB — COMPREHENSIVE METABOLIC PANEL WITH GFR
ALT: 20 U/L (ref 0–53)
AST: 24 U/L (ref 0–37)
Albumin: 4.5 g/dL (ref 3.5–5.2)
Alkaline Phosphatase: 57 U/L (ref 39–117)
BUN: 2 mg/dL — ABNORMAL LOW (ref 6–23)
CO2: 28 meq/L (ref 19–32)
Calcium: 9.2 mg/dL (ref 8.4–10.5)
Chloride: 104 meq/L (ref 96–112)
Creatinine, Ser: 0.57 mg/dL (ref 0.40–1.50)
GFR: 125.5 mL/min (ref >60.00)
Glucose, Bld: 96 mg/dL (ref 70–99)
Potassium: 3.6 meq/L (ref 3.5–5.1)
Sodium: 142 meq/L (ref 135–145)
Total Bilirubin: 0.7 mg/dL (ref 0.2–1.2)
Total Protein: 7 g/dL (ref 6.0–8.3)

## 2023-10-16 LAB — CBC WITH DIFFERENTIAL/PLATELET
Basophils Absolute: 0.1 K/uL (ref 0.0–0.1)
Basophils Relative: 1.2 % (ref 0.0–3.0)
Eosinophils Absolute: 0.2 K/uL (ref 0.0–0.7)
Eosinophils Relative: 2 % (ref 0.0–5.0)
HCT: 46.3 % (ref 39.0–52.0)
Hemoglobin: 16.1 g/dL (ref 13.0–17.0)
Lymphocytes Relative: 24.7 % (ref 12.0–46.0)
Lymphs Abs: 1.9 K/uL (ref 0.7–4.0)
MCHC: 34.7 g/dL (ref 30.0–36.0)
MCV: 92.2 fl (ref 78.0–100.0)
Monocytes Absolute: 0.5 K/uL (ref 0.1–1.0)
Monocytes Relative: 6.1 % (ref 3.0–12.0)
Neutro Abs: 5.1 K/uL (ref 1.4–7.7)
Neutrophils Relative %: 66 % (ref 43.0–77.0)
Platelets: 203 K/uL (ref 150.0–400.0)
RBC: 5.03 Mil/uL (ref 4.22–5.81)
RDW: 13.5 % (ref 11.5–15.5)
WBC: 7.8 K/uL (ref 4.0–10.5)

## 2023-10-16 LAB — LIPASE: Lipase: 77 U/L — ABNORMAL HIGH (ref 11.0–59.0)

## 2023-10-16 MED ORDER — OMEPRAZOLE 40 MG PO CPDR: 40.0000 mg | DELAYED_RELEASE_CAPSULE | Freq: Every day | ORAL | 3 refills | Status: DC

## 2023-10-16 MED ORDER — NA SULFATE-K SULFATE-MG SULF 17.5-3.13-1.6 GM/177ML PO SOLN: 1.0000 | Freq: Once | ORAL | 0 refills | Status: AC

## 2023-10-16 NOTE — Patient Instructions (Signed)
 You have been scheduled for an endoscopy and colonoscopy with Dr. Legrand on 11-01-23. Please follow written instructions given to you at your visit today.  If you use inhalers (even only as needed), please bring them with you on the day of your procedure.  If you take any of the following medications, they will need to be adjusted prior to your procedure:   DO NOT TAKE 7 DAYS PRIOR TO TEST- Trulicity (dulaglutide) Ozempic, Wegovy (semaglutide) Mounjaro (tirzepatide) Bydureon Bcise (exanatide extended release)  DO NOT TAKE 1 DAY PRIOR TO YOUR TEST Rybelsus (semaglutide) Adlyxin (lixisenatide) Victoza (liraglutide) Byetta (exanatide) ___________________________________________________________________________   Please call to schedule your ultrasound: [phone]  You have been scheduled for an abdominal ultrasound at [facility] Radiology (1st floor of hospital) on _____________ at ______________. Please arrive 30 minutes prior to your appointment for registration. Make     certain not to have anything to eat or drink 6 hours prior to your appointment. Should you need to reschedule your appointment, please contact radiology at [phone]. This test typically takes about 30 minutes to perform.   Please go to the lab in the basement of our building to have lab work done as you leave today. Hit B for basement when you get on the elevator.  When the doors open the lab is on your left.  We will call you with the results. Thank you.  We have sent the following medications to your pharmacy for you to pick up at your convenience: Omeprazole  40 fh:Ujxz once daily  You have been scheduled for a follow up appointment on Thursday, 11-30-23 at 11:10am. Please arrive 10 minutes early for registration. If you need to reschedule or cancel this appointment please call [phone] as soon as possible. Thank you.   Thank you for entrusting me with your care and for choosing [facility], [place], [place]    _______________________________________________________  If your blood pressure at your visit was 140/90 or greater, please contact your primary care physician to follow up on this.  _______________________________________________________  If you are age 21 or older, your body mass index should be between 23-30. Your Body mass index is 32 kg/m. If this is out of the aforementioned range listed, please consider follow up with your Primary Care Provider.  If you are age 98 or younger, your body mass index should be between 19-25. Your Body mass index is 32 kg/m. If this is out of the aformentioned range listed, please consider follow up with your Primary Care Provider.   ________________________________________________________  The [facility] providers would like to encourage you to use MYCHART to communicate with providers for non-urgent requests or questions.  Due to long hold times on the telephone, sending your provider a message by [facility] may be a faster and more efficient way to get a response.  Please allow 48 business hours for a response.  Please remember that this is for non-urgent requests.  _______________________________________________________  [facility] is using a team-based approach to care.  Your team is made up of your doctor and two to three APPS. Our APPS (Nurse Practitioners and Physician Assistants) work with your physician to ensure care continuity for you. They are fully qualified to address your health concerns and develop a treatment plan. They communicate directly with your gastroenterologist to care for you. Seeing the Advanced Practice Practitioners on your physician's team can help you by facilitating care more promptly, often allowing for earlier appointments, access to diagnostic testing, procedures, and other specialty  referrals.

## 2023-10-17 LAB — HEPATITIS C ANTIBODY: Hepatitis C Ab: NONREACTIVE

## 2023-10-17 LAB — HEPATITIS A ANTIBODY, TOTAL: Hepatitis A AB,Total: NONREACTIVE

## 2023-10-17 LAB — HEPATITIS B SURFACE ANTIGEN: Hepatitis B Surface Ag: NONREACTIVE

## 2023-10-17 LAB — HEPATITIS B SURFACE ANTIBODY,QUALITATIVE: Hep B S Ab: NONREACTIVE

## 2023-10-18 NOTE — Progress Notes (Signed)
 ____________________________________________________________  Attending physician addendum:  Thank you for sending this case to me. I have reviewed the entire note and agree with the plan.   Victory Brand, MD  ____________________________________________________________

## 2023-10-19 ENCOUNTER — Ambulatory Visit: Payer: Self-pay | Admitting: Gastroenterology

## 2023-10-19 ENCOUNTER — Telehealth: Payer: Self-pay | Admitting: Gastroenterology

## 2023-10-19 NOTE — Telephone Encounter (Signed)
 The pt has been advised that results have not been reviewed and we will call as soon as able.  The pt has been advised of the information and verbalized understanding.

## 2023-10-19 NOTE — Telephone Encounter (Signed)
 Left message on machine to call back

## 2023-10-19 NOTE — Telephone Encounter (Signed)
 Received a call from patients wife requesting nurse fu call to discuss results for patient. Please review and advise  Thank you

## 2023-10-22 ENCOUNTER — Ambulatory Visit (HOSPITAL_COMMUNITY)
Admission: RE | Admit: 2023-10-22 | Discharge: 2023-10-22 | Disposition: A | Source: Ambulatory Visit | Attending: Gastroenterology

## 2023-10-22 DIAGNOSIS — R1011 Right upper quadrant pain: Secondary | ICD-10-CM | POA: Insufficient documentation

## 2023-10-22 DIAGNOSIS — Z8719 Personal history of other diseases of the digestive system: Secondary | ICD-10-CM | POA: Diagnosis present

## 2023-10-22 DIAGNOSIS — R933 Abnormal findings on diagnostic imaging of other parts of digestive tract: Secondary | ICD-10-CM | POA: Insufficient documentation

## 2023-10-22 DIAGNOSIS — R1031 Right lower quadrant pain: Secondary | ICD-10-CM | POA: Insufficient documentation

## 2023-10-22 DIAGNOSIS — R195 Other fecal abnormalities: Secondary | ICD-10-CM | POA: Insufficient documentation

## 2023-10-22 DIAGNOSIS — R634 Abnormal weight loss: Secondary | ICD-10-CM | POA: Insufficient documentation

## 2023-10-22 DIAGNOSIS — K625 Hemorrhage of anus and rectum: Secondary | ICD-10-CM | POA: Insufficient documentation

## 2023-10-22 DIAGNOSIS — K760 Fatty (change of) liver, not elsewhere classified: Secondary | ICD-10-CM | POA: Insufficient documentation

## 2023-10-24 NOTE — Telephone Encounter (Signed)
Attempted to call patient. LVM to return call. 

## 2023-10-24 NOTE — Telephone Encounter (Signed)
 Patient returned call.  Had to call him back.  Again reached voicemail. Left him a detailed message regarding his ultrasound result.  Advised source of pain unknown at this point, up-coming endoscopy may reveal cause of pain.

## 2023-11-01 ENCOUNTER — Ambulatory Visit: Admitting: Gastroenterology

## 2023-11-01 ENCOUNTER — Encounter: Payer: Self-pay | Admitting: Gastroenterology

## 2023-11-01 VITALS — BP 129/88 | HR 69 | Temp 98.4°F | Resp 14 | Ht 75.0 in | Wt 256.0 lb

## 2023-11-01 DIAGNOSIS — K625 Hemorrhage of anus and rectum: Secondary | ICD-10-CM

## 2023-11-01 DIAGNOSIS — R12 Heartburn: Secondary | ICD-10-CM

## 2023-11-01 DIAGNOSIS — R1031 Right lower quadrant pain: Secondary | ICD-10-CM

## 2023-11-01 DIAGNOSIS — K3189 Other diseases of stomach and duodenum: Secondary | ICD-10-CM | POA: Diagnosis not present

## 2023-11-01 DIAGNOSIS — K648 Other hemorrhoids: Secondary | ICD-10-CM

## 2023-11-01 DIAGNOSIS — K30 Functional dyspepsia: Secondary | ICD-10-CM

## 2023-11-01 DIAGNOSIS — R1011 Right upper quadrant pain: Secondary | ICD-10-CM

## 2023-11-01 MED ORDER — SODIUM CHLORIDE 0.9 % IV SOLN: 500.0000 mL | Freq: Once | INTRAVENOUS | Status: DC

## 2023-11-01 NOTE — Progress Notes (Signed)
 No significant changes to clinical history since GI office visit on 10/16/23.  The patient is appropriate for an endoscopic procedure in the ambulatory setting.  - Victory Brand, MD

## 2023-11-01 NOTE — Patient Instructions (Signed)
 Educational handout provided to patient related to Hemorrhoids  Resume previous diet  Continue present medications  Awaiting pathology results   YOU HAD AN ENDOSCOPIC PROCEDURE TODAY AT THE [facility]:   Refer to the procedure report that was given to you for any specific questions about what was found during the examination.  If the procedure report does not answer your questions, please call your gastroenterologist to clarify.  If you requested that your care partner not be given the details of your procedure findings, then the procedure report has been included in a sealed envelope for you to review at your convenience later.  YOU SHOULD EXPECT: Some feelings of bloating in the abdomen. Passage of more gas than usual.  Walking can help get rid of the air that was put into your GI tract during the procedure and reduce the bloating. If you had a lower endoscopy (such as a colonoscopy or flexible sigmoidoscopy) you may notice spotting of blood in your stool or on the toilet paper. If you underwent a bowel prep for your procedure, you may not have a normal bowel movement for a few days.  Please Note:  You might notice some irritation and congestion in your nose or some drainage.  This is from the oxygen used during your procedure.  There is no need for concern and it should clear up in a day or so.  SYMPTOMS TO REPORT IMMEDIATELY:  Following lower endoscopy (colonoscopy or flexible sigmoidoscopy):  Excessive amounts of blood in the stool  Significant tenderness or worsening of abdominal pains  Swelling of the abdomen that is new, acute  Fever of 100F or higher  Following upper endoscopy (EGD)  Vomiting of blood or coffee ground material  New chest pain or pain under the shoulder blades  Painful or persistently difficult swallowing  New shortness of breath  Fever of 100F or higher  Black, tarry-looking stools  For urgent or emergent issues, a gastroenterologist can be  reached at any hour by calling [phone]. Do not use MyChart messaging for urgent concerns.    DIET:  We do recommend a small meal at first, but then you may proceed to your regular diet.  Drink plenty of fluids but you should avoid alcoholic beverages for 24 hours.  ACTIVITY:  You should plan to take it easy for the rest of today and you should NOT DRIVE or use heavy machinery until tomorrow (because of the sedation medicines used during the test).    FOLLOW UP: Our staff will call the number listed on your records the next business day following your procedure.  We will call around 7:15- 8:00 am to check on you and address any questions or concerns that you may have regarding the information given to you following your procedure. If we do not reach you, we will leave a message.     If any biopsies were taken you will be contacted by phone or by letter within the next 1-3 weeks.  Please call us at [phone] if you have not heard about the biopsies in 3 weeks.    SIGNATURES/CONFIDENTIALITY: You and/or your care partner have signed paperwork which will be entered into your electronic medical record.  These signatures attest to the fact that that the information above on your After Visit Summary has been reviewed and is understood.  Full responsibility of the confidentiality of this discharge information lies with you and/or your care-partner.

## 2023-11-01 NOTE — Op Note (Signed)
 [name] Name: Marc Mayo Procedure Date: 11/01/2023 2:22 PM MRN: [id number] Endoscopist: Victory L. Legrand , MD, [id number] Age: 38 Referring MD:  Date of Birth: [birth year] Gender: Male Account #: [id number] Procedure:                Upper GI endoscopy Indications:              Indigestion, Heartburn Medicines:                Monitored Anesthesia Care Procedure:                Pre-Anesthesia Assessment:                           - Prior to the procedure, a History and Physical                            was performed, and patient medications and                            allergies were reviewed. The patient's tolerance of                            previous anesthesia was also reviewed. The risks                            and benefits of the procedure and the sedation                            options and risks were discussed with the patient.                            All questions were answered, and informed consent                            was obtained. Prior Anticoagulants: The patient has                            taken no anticoagulant or antiplatelet agents. ASA                            Grade Assessment: III - A patient with severe                            systemic disease. After reviewing the risks and                            benefits, the patient was deemed in satisfactory                            condition to undergo the procedure.                           After obtaining informed consent, the endoscope was  passed under direct vision. Throughout the                            procedure, the patient's blood pressure, pulse, and                            oxygen  saturations were monitored continuously. The                            Olympus Scope [id number] was introduced through the                            mouth, and advanced to the second part of duodenum. Scope In: Scope Out: Findings:                 The  esophagus was normal.                           Normal mucosa was found in the entire examined                            stomach. Several biopsies were obtained in the                            gastric body and in the gastric antrum with cold                            forceps for histology.                           The cardia and gastric fundus were normal on                            retroflexion. Stomach distended well with                            insufflation                           Normal mucosa was found in the entire duodenum.                            Biopsies for histology were taken with a cold                            forceps for evaluation of celiac disease. Complications:            No immediate complications. Estimated Blood Loss:     Estimated blood loss was minimal. Impression:               - Normal esophagus.                           - Normal mucosa was found in the entire stomach.                           -  Normal mucosa was found in the entire examined                            duodenum. Biopsied.                           - Several biopsies were obtained in the gastric                            body and in the gastric antrum. Recommendation:           - Patient has a contact number available for                            emergencies. The signs and symptoms of potential                            delayed complications were discussed with the                            patient. Return to normal activities tomorrow.                            Written discharge instructions were provided to the                            patient.                           - Resume previous diet.                           - Continue present medications.                           - Await pathology results.                           - Discontinue the use of any products containing                            nicotine . Smoking cessation would improve your                             reflux symptoms.                           - See the other procedure note for documentation of                            additional recommendations. Pershing Skidmore L. Legrand, MD 11/01/2023 3:10:09 PM This report has been signed electronically.

## 2023-11-01 NOTE — Progress Notes (Signed)
 Vss nad trans to pacu

## 2023-11-01 NOTE — Progress Notes (Signed)
 Pt's states no medical or surgical changes since previsit or office visit.

## 2023-11-01 NOTE — Op Note (Signed)
 [facility] Patient Name: Marc Mayo Procedure Date: 11/01/2023 2:10 PM MRN: [id number] Endoscopist: Victory L. Legrand , MD, [id number] Age: 38 Referring MD:  Date of Birth: [birth year] Gender: Male Account #: [id number] Procedure:                Colonoscopy Indications:              Abdominal pain in the right lower quadrant, Rectal                            bleeding Medicines:                Monitored Anesthesia Care Procedure:                Pre-Anesthesia Assessment:                           - Prior to the procedure, a History and Physical                            was performed, and patient medications and                            allergies were reviewed. The patient's tolerance of                            previous anesthesia was also reviewed. The risks                            and benefits of the procedure and the sedation                            options and risks were discussed with the patient.                            All questions were answered, and informed consent                            was obtained. Prior Anticoagulants: The patient has                            taken no anticoagulant or antiplatelet agents. ASA                            Grade Assessment: III - A patient with severe                            systemic disease. After reviewing the risks and                            benefits, the patient was deemed in satisfactory                            condition to undergo the procedure.  After obtaining informed consent, the colonoscope                            was passed under direct vision. Throughout the                            procedure, the patient's blood pressure, pulse, and                            oxygen  saturations were monitored continuously. The                            CF [id number] #[id number] was introduced through the anus                            and advanced to the the terminal ileum, with                             identification of the appendiceal orifice and IC                            valve. The colonoscopy was performed without                            difficulty. The patient tolerated the procedure                            well. The quality of the bowel preparation was                            good. The terminal ileum, ileocecal valve,                            appendiceal orifice, and rectum were photographed. Scope In: 2:49:45 PM Scope Out: 3:01:56 PM Scope Withdrawal Time: 0 hours 9 minutes 44 seconds  Total Procedure Duration: 0 hours 12 minutes 11 seconds  Findings:                 The perianal and digital rectal examinations were                            normal.                           The terminal ileum appeared normal.                           Internal hemorrhoids were found.                           The exam was otherwise without abnormality on                            direct and retroflexion views. Complications:            No immediate complications. Estimated  Blood Loss:     Estimated blood loss: none. Impression:               - The examined portion of the ileum was normal.                           - Internal hemorrhoids.                           - The examination was otherwise normal on direct                            and retroflexion views.                           - No specimens collected. Recommendation:           - Patient has a contact number available for                            emergencies. The signs and symptoms of potential                            delayed complications were discussed with the                            patient. Return to normal activities tomorrow.                            Written discharge instructions were provided to the                            patient.                           - Resume previous diet.                           - Continue present medications.                           -  Repeat colonoscopy in 10 years for screening                            purposes.                           - See the other procedure note for documentation of                            additional recommendations.                           - Return to my office if hemorrhoidal banding                            desired. Kacy Hegna L. Legrand, MD 11/01/2023 3:12:37 PM This report has been signed electronically.

## 2023-11-01 NOTE — Progress Notes (Signed)
 Called to room to assist during endoscopic procedure.  Patient ID and intended procedure confirmed with present staff. Received instructions for my participation in the procedure from the performing physician.

## 2023-11-02 ENCOUNTER — Telehealth: Payer: Self-pay | Admitting: *Deleted

## 2023-11-02 NOTE — Telephone Encounter (Signed)
 Thank you for the note.  He had an uncomplicated upper endoscopy and colonoscopy with only gastric biopsies taken.  In addition, he was not complaining of chest pain or other symptoms prior to discharge from the [facility] yesterday.  He has been having heartburn and indigestion, so it is possible that has caused the symptoms.  However, he is also a smoker and therefore risk for heart disease.  For that reason, I strongly recommend he consider being seen at an urgent care or emergency department today if that chest pain is still present.  VEAR Brand MD

## 2023-11-02 NOTE — Telephone Encounter (Signed)
 Called pt and relayed MD recommendations above. Pt verbalized understanding and states he will seek care at an urgent care or ED if pain persists.

## 2023-11-02 NOTE — Telephone Encounter (Signed)
  Follow up Call-     11/01/2023    1:30 PM  Call back number  Post procedure Call Back phone  # [phone]  Permission to leave phone message Yes     Patient questions:  Do you have a fever, pain , or abdominal swelling? No. Pain Score  8 * Pt c/o tightness on right side of chest that started after EGD yesterday. Reports he did not sleep well last night d/t the tightness. Denies fever or SOB. Denies any other pain or discomfort. Pt states I'm not going to let it stop me, I'm on my way to work. Reports he was able to eat food and tolerated it without any issues. Will make MD aware for any further recommendations. Instructed pt to go to the ER for evaluation if pain worsens or he develops any other symptoms. Pt verbalized understanding.   Have you tolerated food without any problems? Yes.    Have you been able to return to your normal activities? Yes.    Do you have any questions about your discharge instructions: Diet   No. Medications  No. Follow up visit  No.  Do you have questions or concerns about your Care? No.  Actions: * If pain score is 4 or above: Physician/ provider Notified : Victory L. Legrand, MD. Date 11/02/2023  at Time 7:38 AM .

## 2023-11-06 ENCOUNTER — Ambulatory Visit: Payer: Self-pay | Admitting: Gastroenterology

## 2023-11-06 LAB — SURGICAL PATHOLOGY

## 2023-11-30 ENCOUNTER — Ambulatory Visit: Admitting: Gastroenterology

## 2023-11-30 NOTE — Progress Notes (Deleted)
 Marc Mayo [id number] 10/09/1985   Chief Complaint:  Referring Provider: Nena Rosina CROME, PA-C Primary GI MD: Dr. Legrand  HPI: NOX TALENT is a 38 y.o. male with past medical history of anxiety/depression, ADHD, bipolar affective disorder, stomach ulcer, fatty liver, HTN, IBS, OSA, smoking who presents today for a complaint of *** .    Patient seen in the ED 08/26/2023 for complaint of bloody stools over the previous 2 weeks. Bowel movements otherwise normal. Endorsed generalized abdominal pain without vomiting. Reported history of ulcers and endorsed taking 1 ibuprofen  or Goody powder per day. No frequent alcohol use and no blood thinner use. Physical exam notable for mild generalized tenderness to palpation of the abdomen. Hemoccult was trace positive, no overt melena or BRBPR, no obvious hemorrhoids or surrounding rashes. Labs notable for mildly elevated lipase to 27, otherwise unremarkable, no significant leukocytosis, stable hemoglobin and hematocrit. CT without any acute findings, but does note chronic findings such as bilateral inguinal hernias, small area of fat deposition in the sigmoid colon suggestive of mild chronic inflammatory sequela, diverticulosis. GI follow-up was recommended. Advised to avoid NSAIDs and alcohol as well as Goody powders.   Seen in office 10/16/2023 with multiple GI complaints including intermittent rectal bleeding, intermittent rectal pain with bowel movements, intermittent black stools with history of stomach ulcers reported by patient and previous use of NSAIDs.  Denied prior colonoscopy, last EGD 2011 with finding of gastritis.  He endorsed decreased appetite and unintentional weight loss, per chart review had lost about 15 pounds in the last year.  Labs showed normal CBC, negative viral hepatitis, mild elevation in lipase to 77 (improved from 227 at ED visit), normal liver enzymes.  Ultrasound showed hepatic steatosis and otherwise normal with no  gallstones seen.  Underwent EGD/colonoscopy 11/01/2023.  EGD unremarkable with biopsies negative for H. pylori.  Colonoscopy with no polyps and nothing to explain abdominal pain.  Noted to have internal hemorrhoids and advised he could have hemorrhoid banding if desired. Advised to quit smoking to improve reflux symptoms.   Discussed the use of AI scribe software for clinical note transcription with the patient, who gave verbal consent to proceed.  History of Present Illness       Previous GI Procedures/Imaging   EGD 11/01/2023 - Normal esophagus.  - Normal mucosa was found in the entire stomach.  - Normal mucosa was found in the entire examined duodenum. Biopsied.  - Several biopsies were obtained in the gastric body and in the gastric antrum. Path: 1. Surgical [P], duodenum :      -  DUODENAL MUCOSA WITH PROMINENT BRUNNER'S GLANDS AND FOCAL FOVEOLAR METAPLASIA      AND ASSOCIATED REACTIVE/REPARATIVE CHANGE WITH MILD ARCHITECTURAL DISARRAY      INCLUDING AN ELEMENT OF VILLOUS BLUNTING.      NOTE: THE OVERALL FEATURES FAVOR A DIAGNOSIS OF CHRONIC PEPTIC DUODENITIS GIVEN      THE LACK OF SIGNIFICANT INTRAEPITHELIAL LYMPHOCYTES.  CLINICAL/SEROLOGIC      CORRELATION RECOMMENDED.       2. Surgical [P], gastric antrum and gastric body :      -  ANTRAL AND OXYNTIC MUCOSA WITH NO SIGNIFICANT PATHOLOGY.      -  NO HELICOBACTER PYLORI ORGANISMS IDENTIFIED ON H&E STAINED SLIDE.   Colonoscopy 11/01/2023 - The examined portion of the ileum was normal.  - Internal hemorrhoids.  - The examination was otherwise normal on direct and retroflexion views.  - No specimens collected. - Recall 10 years  RUQ US  10/22/2023 IMPRESSION: Diffuse increased echogenicity of the hepatic parenchyma is a nonspecific indicator of hepatocellular dysfunction, most commonly steatosis.  CT A/P 08/26/2023 1. No acute intra-abdominal or pelvic pathology.  2. Bilateral fat-containing inguinal hernias.  3. Few  colonic diverticuli without CT evidence of diverticulitis.  4. Subtle intramural fat deposition within the wall of the sigmoid colon is nonspecific but suggests mild chronic inflammatory sequelae. No acute inflammatory stranding is evident.    Prior EGD 12/23/2009 for abdominal pain and reflux, dx gastritis   Past Medical History:  Diagnosis Date   ADHD (attention deficit hyperactivity disorder)    Anxiety    Bipolar affective disorder (HCC)    Bleeding stomach ulcer    chronic (08/09/2017)   Chronic bronchitis (HCC)    Chronic eustachian tube dysfunction 11/2011   Chronic lower back pain    Depression    Fatty liver    GERD (gastroesophageal reflux disease)    Hernia of abdominal wall    History of kidney stones    Hypertension    IBS (irritable bowel syndrome)    Migraine    a couple/wk (08/09/2017)   OSA (obstructive sleep apnea)    waiting on new mask; they took one 2 yr ago cause it didn't work right (08/09/2017)    Past Surgical History:  Procedure Laterality Date   CYSTOSCOPY W/ URETEROSCOPY W/ LITHOTRIPSY     ESOPHAGOGASTRODUODENOSCOPY  2011   Dr. Harvey: chronic non-H.pylori gastritis, normal small bowel biopsy    FINGER FRACTURE SURGERY Right 08/10/2015   pins placed in third finger   FRACTURE SURGERY     INGUINAL HERNIA REPAIR Bilateral 08/09/2017   open; w/mesh   INGUINAL HERNIA REPAIR Bilateral 08/09/2017   Procedure: OPEN HERNIA REPAIR INGUINAL ADULT BILATERAL WITH MESH;  Surgeon: Belinda Cough, MD;  Location: [facility];  Service: General;  Laterality: Bilateral;  GENERAL AND TAP BLOCK   INSERTION OF MESH Bilateral 08/09/2017   Procedure: INSERTION OF MESH;  Surgeon: Belinda Cough, MD;  Location: [facility];  Service: General;  Laterality: Bilateral;  GENERAL AND TAP BLOCK   MYRINGOTOMY WITH TUBE PLACEMENT  12/12/2011   Procedure: MYRINGOTOMY WITH TUBE PLACEMENT;  Surgeon: Marlyce Finer, MD;  Location: [facility];  Service: ENT;  Laterality: Bilateral;  Bilateral T Tubes   NASAL ENDOSCOPY WITH EPISTAXIS CONTROL  12/12/2011   Procedure: NASAL ENDOSCOPY WITH EPISTAXIS CONTROL;  Surgeon: Marlyce Finer, MD;  Location: [facility];  Service: ENT;  Laterality: Bilateral;  Direct Nasal Endoscopy with Cautery of Eustachian Tube Tori, Bilateral   TONSILLECTOMY  2018   TYMPANOSTOMY TUBE PLACEMENT Bilateral X 7    Current Outpatient Medications  Medication Sig Dispense Refill   omeprazole  (PRILOSEC) 40 MG capsule Take 1 capsule (40 mg total) by mouth daily. 30 capsule 3   No current facility-administered medications for this visit.    Allergies as of 11/30/2023 - Review Complete 11/01/2023  Allergen Reaction Noted   Tramadol Itching 07/27/2012   Alprazolam Other (See Comments) 12/06/2011   Aripiprazole Hives 09/03/2012   Celexa [citalopram hydrobromide] Rash and Other (See Comments) 07/27/2012   Ketorolac Hives and Other (See Comments) 04/10/2013   Lorazepam Other (See Comments) 03/17/2008   Paxil [paroxetine hcl] Other (See Comments) 10/13/2010   Zoloft [sertraline hcl] Other (See Comments) 09/03/2012   Prozac [fluoxetine hcl] Other (See Comments) 10/13/2010    Family History  Problem Relation Age of Onset   Arthritis Mother  COPD Mother    Depression Mother    Diabetes Mother    Hyperlipidemia Mother    Alcohol abuse Father    Cancer Father        cell   COPD Father    Stroke Father    Early death Brother    Asthma Daughter    Colon cancer Neg Hx     Social History   Tobacco Use   Smoking status: Every Day    Current packs/day: 2.00    Average packs/day: 2.0 packs/day for 17.0 years (34.0 ttl pk-yrs)    Types: Cigarettes   Smokeless tobacco: Former    Types: Engineer, Drilling   Vaping status: Never Used  Substance Use Topics   Alcohol use: Yes    Comment: occ   Drug use: Not Currently     Review of Systems:    Constitutional: No weight loss, fever,  chills, weakness or fatigue Eyes: No change in vision Ears, Nose, Throat:  No change in hearing or congestion Skin: No rash or itching Cardiovascular: No chest pain, chest pressure or palpitations   Respiratory: No SOB or cough Gastrointestinal: See HPI and otherwise negative Genitourinary: No dysuria or change in urinary frequency Neurological: No headache, dizziness or syncope Musculoskeletal: No new muscle or joint pain Hematologic: No bleeding or bruising    Physical Exam:  Vital signs: There were no vitals taken for this visit.  Constitutional: NAD, Well developed, Well nourished, alert and cooperative Head:  Normocephalic and atraumatic.  Eyes: No scleral icterus. Conjunctiva pink. Mouth: No oral lesions. Respiratory: Respirations even and unlabored. Lungs clear to auscultation bilaterally.  No wheezes, crackles, or rhonchi.  Cardiovascular:  Regular rate and rhythm. No murmurs. No peripheral edema. Gastrointestinal:  Soft, nondistended, nontender. No rebound or guarding. Normal bowel sounds. No appreciable masses or hepatomegaly. Rectal:  Not performed.  Neurologic:  Alert and oriented x4;  grossly normal neurologically.  Skin:   Dry and intact without significant lesions or rashes. Psychiatric: Oriented to person, place and time. Demonstrates good judgement and reason without abnormal affect or behaviors.   RELEVANT LABS AND IMAGING: CBC    Component Value Date/Time   WBC 7.8 10/16/2023 1051   RBC 5.03 10/16/2023 1051   HGB 16.1 10/16/2023 1051   HCT 46.3 10/16/2023 1051   PLT 203.0 10/16/2023 1051   MCV 92.2 10/16/2023 1051   MCH 32.1 12/28/2019 1442   MCHC 34.7 10/16/2023 1051   RDW 13.5 10/16/2023 1051   LYMPHSABS 1.9 10/16/2023 1051   MONOABS 0.5 10/16/2023 1051   EOSABS 0.2 10/16/2023 1051   BASOSABS 0.1 10/16/2023 1051    CMP     Component Value Date/Time   NA 142 10/16/2023 1051   NA 140 04/22/2016 0854   K 3.6 10/16/2023 1051   CL 104 10/16/2023  1051   CO2 28 10/16/2023 1051   GLUCOSE 96 10/16/2023 1051   BUN 2 (L) 10/16/2023 1051   BUN 6 04/22/2016 0854   CREATININE 0.57 10/16/2023 1051   CALCIUM 9.2 10/16/2023 1051   PROT 7.0 10/16/2023 1051   ALBUMIN 4.5 10/16/2023 1051   AST 24 10/16/2023 1051   ALT 20 10/16/2023 1051   ALKPHOS 57 10/16/2023 1051   BILITOT 0.7 10/16/2023 1051   GFRNONAA >60 12/28/2019 1442   GFRAA >60 03/21/2018 1807     Assessment/Plan:    Assessment and Plan Assessment & Plan   Hepatic steatosis Patient with history of fatty liver.  Seen for this  in 2014 per chart review.  Has recently had normal liver enzymes and liver appeared normal on recent CT scan. FIB-4 score of 0.5, advanced fibrosis excluded.   - Monitor CBC, CMP every 6 months - Abstain from all alcohol including beer, wine, liquor, and non-alcoholic beer.  - Work to maintain a healthy weight through portion control and exercise  - Maximize control of any hyperglycemia and hyperlipidemia       Camie Furbish, PA-C [facility] 11/30/2023, 8:32 AM  Patient Care Team: Nena Rosina LITTIE DEVONNA as PCP - General (Physician Assistant) Harvey Margo LITTIE, MD (Inactive) as Attending Physician (Gastroenterology)

## 2024-01-11 ENCOUNTER — Other Ambulatory Visit: Payer: Self-pay | Admitting: Gastroenterology

## 2024-01-23 ENCOUNTER — Telehealth: Payer: Self-pay | Admitting: Gastroenterology

## 2024-01-23 NOTE — Telephone Encounter (Signed)
 Inbound call from patients wife calling to advise that patient is wishing to follow through with hemorrhoid surgery to have them removed instead of banding. Please advise.

## 2024-01-23 NOTE — Telephone Encounter (Signed)
 Is it okay to do a surgical referral for treatment of the patient's hemorrhoids?

## 2024-01-24 NOTE — Telephone Encounter (Signed)
 Yes, referral to colorectal surgery at [facility]  - H. Danis

## 2024-01-24 NOTE — Telephone Encounter (Signed)
 Referral to [facility]. Spouse notified.

## 2024-01-25 ENCOUNTER — Other Ambulatory Visit: Payer: Self-pay

## 2024-01-25 DIAGNOSIS — K649 Unspecified hemorrhoids: Secondary | ICD-10-CM
